# Patient Record
Sex: Female | Born: 1945 | Race: Black or African American | Hispanic: No | Smoking: Former smoker
Health system: Southern US, Community
[De-identification: ages and names within clinical notes are randomized; demographics above are authoritative.]

## PROBLEM LIST (undated history)

## (undated) ENCOUNTER — Emergency Department (HOSPITAL_COMMUNITY): Payer: Medicare PPO | Source: Home / Self Care

## (undated) DIAGNOSIS — L97909 Non-pressure chronic ulcer of unspecified part of unspecified lower leg with unspecified severity: Secondary | ICD-10-CM

## (undated) DIAGNOSIS — R010 Benign and innocent cardiac murmurs: Secondary | ICD-10-CM

## (undated) DIAGNOSIS — I8390 Asymptomatic varicose veins of unspecified lower extremity: Secondary | ICD-10-CM

## (undated) DIAGNOSIS — I10 Essential (primary) hypertension: Secondary | ICD-10-CM

## (undated) DIAGNOSIS — M1990 Unspecified osteoarthritis, unspecified site: Secondary | ICD-10-CM

## (undated) HISTORY — DX: Asymptomatic varicose veins of unspecified lower extremity: I83.90

## (undated) HISTORY — DX: Morbid (severe) obesity due to excess calories: E66.01

## (undated) HISTORY — DX: Unspecified osteoarthritis, unspecified site: M19.90

## (undated) HISTORY — DX: Benign and innocent cardiac murmurs: R01.0

## (undated) HISTORY — DX: Essential (primary) hypertension: I10

## (undated) HISTORY — DX: Non-pressure chronic ulcer of unspecified part of unspecified lower leg with unspecified severity: L97.909

---

## 1978-05-10 DIAGNOSIS — I10 Essential (primary) hypertension: Secondary | ICD-10-CM

## 1978-05-10 HISTORY — DX: Essential (primary) hypertension: I10

## 1992-05-10 HISTORY — PX: ABDOMINAL HYSTERECTOMY: SHX81

## 2000-02-11 DIAGNOSIS — G542 Cervical root disorders, not elsewhere classified: Secondary | ICD-10-CM | POA: Insufficient documentation

## 2001-02-14 DIAGNOSIS — F419 Anxiety disorder, unspecified: Secondary | ICD-10-CM | POA: Insufficient documentation

## 2004-11-12 ENCOUNTER — Encounter: Admission: RE | Admit: 2004-11-12 | Discharge: 2004-11-12 | Payer: Self-pay | Admitting: Obstetrics and Gynecology

## 2005-08-19 DIAGNOSIS — R252 Cramp and spasm: Secondary | ICD-10-CM | POA: Insufficient documentation

## 2006-06-01 DIAGNOSIS — G609 Hereditary and idiopathic neuropathy, unspecified: Secondary | ICD-10-CM | POA: Insufficient documentation

## 2008-06-25 DIAGNOSIS — M25562 Pain in left knee: Secondary | ICD-10-CM | POA: Insufficient documentation

## 2012-10-26 ENCOUNTER — Other Ambulatory Visit: Payer: Self-pay | Admitting: *Deleted

## 2012-10-26 DIAGNOSIS — I83893 Varicose veins of bilateral lower extremities with other complications: Secondary | ICD-10-CM

## 2012-12-27 ENCOUNTER — Other Ambulatory Visit: Payer: Self-pay | Admitting: Neurosurgery

## 2012-12-27 DIAGNOSIS — M542 Cervicalgia: Secondary | ICD-10-CM

## 2012-12-29 ENCOUNTER — Encounter: Payer: Self-pay | Admitting: Vascular Surgery

## 2012-12-31 ENCOUNTER — Other Ambulatory Visit: Payer: Self-pay

## 2013-01-01 ENCOUNTER — Encounter: Payer: Self-pay | Admitting: Vascular Surgery

## 2013-01-01 ENCOUNTER — Ambulatory Visit (INDEPENDENT_AMBULATORY_CARE_PROVIDER_SITE_OTHER): Payer: Medicare PPO | Admitting: Vascular Surgery

## 2013-01-01 ENCOUNTER — Encounter (INDEPENDENT_AMBULATORY_CARE_PROVIDER_SITE_OTHER): Payer: Medicare PPO | Admitting: *Deleted

## 2013-01-01 VITALS — BP 136/76 | HR 59 | Resp 18 | Ht 68.0 in | Wt 270.0 lb

## 2013-01-01 DIAGNOSIS — I83893 Varicose veins of bilateral lower extremities with other complications: Secondary | ICD-10-CM

## 2013-01-01 DIAGNOSIS — I8390 Asymptomatic varicose veins of unspecified lower extremity: Secondary | ICD-10-CM | POA: Insufficient documentation

## 2013-01-01 NOTE — Progress Notes (Signed)
Subjective:     Patient ID: Kim Wilkerson, female   DOB: 07-19-1945, 67 y.o.   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 These have been present for several years. She has swelling in both legs as the day progresses. She has no history of deep vein thrombosis. She also has no history of stasis ulcers. She tried to worsen long light elastic compression stockings referred by a physician in the past but they were quite tight and she had difficulty getting them on and off and discontinued them. Her symptoms continued to worsen with severe hypersensitivity itching burning and stinging discomfort which worsened as the day progresses.  Past Medical History  Diagnosis Date  . Hypertension   . Varicose veins   . Arthritis   . Ulcer     leg    History  Substance Use Topics  . Smoking status: Former Smoker    Types: Cigarettes    Quit date: 01/02/1983  . Smokeless tobacco: Never Used  . Alcohol Use: Yes    Family History  Problem Relation Age of Onset  . Hypertension Mother   . Hypertension Father     No Known Allergies  Current outpatient prescriptions:diltiazem (CARDIZEM CD) 240 MG 24 hr capsule, Take 240 mg by mouth daily., Disp: , Rfl: ;  furosemide (LASIX) 10 MG/ML solution, Take 10 mg by mouth daily., Disp: , Rfl: ;  HYDROcodone-acetaminophen (NORCO) 10-325 MG per tablet, Take 1 tablet by mouth every 4 (four) hours as needed for pain., Disp: , Rfl: ;  Vitamin D, Ergocalciferol, (DRISDOL) 50000 UNITS CAPS capsule, Take 50,000 Units by mouth., Disp: , Rfl:   BP 136/76  Pulse 59  Resp 18  Ht 5\' 8"  (1.727 m)  Wt 270 lb (122.471 kg)  BMI 41.06 kg/m2  Body mass index is 41.06 kg/(m^2).        Review of Systems complains of chest pain, palpitations, leg pain with walking, swelling in legs, weakness in legs, numbness in legs, She has easy fatigability and unable to ambulate further than one block.  Other systems negative and complete review of systems    Objective:   Physical Exam BP 136/76  Pulse 59  Resp 18  Ht 5\' 8"  (1.727 m)  Wt 270 lb (122.471 kg)  BMI 41.06 kg/m2  Gen.-alert and oriented x3 in no apparent distress HEENT normal for age-obese Lungs no rhonchi or wheezing Cardiovascular regular rhythm no murmurs carotid pulses 3+ palpable no bruits audible Abdomen soft nontender no palpable masses-obese Musculoskeletal free of  major deformities Skin clear -no rashes Neurologic normal Lower extremities 3+ femoral and dorsalis pedis pulses palpable bilaterally with 1+ edema bilaterally. Right leg has bulging varicosities down lateral calf area with other spider veins in clusters. No active ulceration noted. Left leg has some small bulging varicosities below the knee and great saphenous system.  Today I ordered a venous duplex exam of the right leg which are reviewed and interpreted. There is gross reflux which extends down to th distal thigh in the great saphenous vein with no DVT. This is a large caliber vein. I confirmed this by my independent SonoSite examination of the right great saphenous system.e      Assessment:     Severe venous insufficiency right leg with great saphenous reflux supplying bulging painful varicosities and causing chronic edema and pain right leg-affecting patient's daily living     Plan:     #1  long-leg elastic compression stockings 20-30 mm gradient #2 elevate legs as much as possible during the day #3 ibuprofen on a daily basis #4 return in 3 months. If no dramatic improvement in right leg patient needs #1 laser ablation right great saphenous vein and then return in 3 months to see if either sclerotherapy or stab phlebectomy would be indicated as a secondary procedure

## 2013-01-04 ENCOUNTER — Ambulatory Visit
Admission: RE | Admit: 2013-01-04 | Discharge: 2013-01-04 | Disposition: A | Discharge status: Home / Self Care | Payer: Medicare PPO | Source: Ambulatory Visit | Attending: Neurosurgery | Admitting: Neurosurgery

## 2013-01-04 DIAGNOSIS — M542 Cervicalgia: Secondary | ICD-10-CM

## 2013-03-15 ENCOUNTER — Other Ambulatory Visit: Payer: Self-pay

## 2013-04-02 ENCOUNTER — Encounter: Payer: Self-pay | Admitting: Vascular Surgery

## 2013-04-03 ENCOUNTER — Ambulatory Visit: Payer: Medicare PPO | Admitting: Vascular Surgery

## 2013-04-16 ENCOUNTER — Encounter: Payer: Self-pay | Admitting: Vascular Surgery

## 2013-04-17 ENCOUNTER — Encounter: Payer: Self-pay | Admitting: Vascular Surgery

## 2013-04-17 ENCOUNTER — Ambulatory Visit (INDEPENDENT_AMBULATORY_CARE_PROVIDER_SITE_OTHER): Payer: Medicare PPO | Admitting: Vascular Surgery

## 2013-04-17 VITALS — BP 162/80 | HR 71 | Resp 18 | Ht 68.0 in | Wt 274.0 lb

## 2013-04-17 DIAGNOSIS — I83893 Varicose veins of bilateral lower extremities with other complications: Secondary | ICD-10-CM

## 2013-04-17 NOTE — Progress Notes (Signed)
Subjective:     Patient ID: Kim Wilkerson, female   DOB: Aug 23, 1945, 67 y.o.   MRN: [id number]  HPI this 67 year old female returns for continued followup regarding her severe venous insufficiency in the right leg. She has bulging varicosities in the distal thigh and lateral calf area do to reflux in the right great saphenous pain. She has tried long light elastic compression stockings which are quite difficult because of the large size of her legs. She's also tried ibuprofen and elevation. She's had no improvement in her symptoms of aching burning and throbbing discomfort. She has no history of DVT.  Past Medical History  Diagnosis Date  . Hypertension   . Varicose veins   . Arthritis   . Ulcer     leg    History  Substance Use Topics  . Smoking status: Former Smoker    Types: Cigarettes    Quit date: 01/02/1983  . Smokeless tobacco: Never Used  . Alcohol Use: Yes    Family History  Problem Relation Age of Onset  . Hypertension Mother   . Hypertension Father     No Known Allergies  Current outpatient prescriptions:diltiazem (CARDIZEM CD) 240 MG 24 hr capsule, Take 240 mg by mouth daily., Disp: , Rfl: ;  furosemide (LASIX) 10 MG/ML solution, Take 10 mg by mouth daily., Disp: , Rfl: ;  HYDROcodone-acetaminophen (NORCO) 10-325 MG per tablet, Take 1 tablet by mouth every 4 (four) hours as needed for pain., Disp: , Rfl: ;  Vitamin D, Ergocalciferol, (DRISDOL) 50000 UNITS CAPS capsule, Take 50,000 Units by mouth., Disp: , Rfl:   BP 162/80  Pulse 71  Resp 18  Ht 5\' 8"  (1.727 m)  Wt 274 lb (124.286 kg)  BMI 41.67 kg/m2  Body mass index is 41.67 kg/(m^2).           Review of Systems denies chest pain but does have dyspnea on exertion. No PND orthopnea or hemoptysis    Objective:   Physical Exam BP 162/80  Pulse 71  Resp 18  Ht 5\' 8"  (1.727 m)  Wt 274 lb (124.286 kg)  BMI 41.67 kg/m2  Gen. obese female in no apparent distress alert and oriented x3 Right lower  extremity with bulging varicosities anterior thigh extending down the lateral calf with 1+ distal edema. No active ulceration.     Assessment:     Severe venous insufficiency right leg with bulging painful varicosities and chronic edema due to to gross reflux right great saphenous vein. These symptoms are persistent to conservative measures and affecting patient's daily living    Plan:     Patient needs #1 laser ablation right great saphenous vein followed by a 3 month waiting period to then return and evaluate for possible sclerotherapy of the secondary varicosities in the right leg

## 2013-04-24 ENCOUNTER — Other Ambulatory Visit: Payer: Self-pay | Admitting: *Deleted

## 2013-04-24 DIAGNOSIS — I83893 Varicose veins of bilateral lower extremities with other complications: Secondary | ICD-10-CM

## 2013-05-02 ENCOUNTER — Encounter: Payer: Self-pay | Admitting: Vascular Surgery

## 2013-05-07 ENCOUNTER — Encounter: Payer: Self-pay | Admitting: Vascular Surgery

## 2013-05-07 ENCOUNTER — Ambulatory Visit (INDEPENDENT_AMBULATORY_CARE_PROVIDER_SITE_OTHER): Payer: Medicare PPO | Admitting: Vascular Surgery

## 2013-05-07 VITALS — BP 171/114 | HR 57 | Resp 16 | Ht >= 80 in | Wt 270.0 lb

## 2013-05-07 DIAGNOSIS — I83893 Varicose veins of bilateral lower extremities with other complications: Secondary | ICD-10-CM

## 2013-05-07 NOTE — Progress Notes (Signed)
Subjective:     Patient ID: Kim Wilkerson, female   DOB: [birth year], 67 y.o.   MRN: [id number]  HPI this 67 year old female had laser ablation right great saphenous vein performed for gross reflux in the right great saphenous system with venous hypertension pain and swelling. A total of 1090 J of energy was utilized. She tolerated the procedure well. This was done under local tumescent anesthesia.   Review of Systems     Objective:   Physical Exam BP 171/114  Pulse 57  Resp 16  Ht 9\' 10"  (2.997 m)  Wt 270 lb (122.471 kg)  BMI 13.64 kg/m2       Assessment:     Well-tolerated laser ablation right great saphenous vein performed under local tumescent anesthesia    Plan:     Return in one week for venous duplex exam right leg confirm closure right great saphenous vein Will then return in 3 months to see if sclerotherapy will be indicated for secondary varicosities

## 2013-05-07 NOTE — Progress Notes (Signed)
   Laser Ablation Procedure      Date: 05/07/2013    Kim Wilkerson DOB:[birth year]  Consent signed: Yes  Surgeon:J.D. Hart [place]  Procedure: Laser Ablation: right Greater Saphenous Vein  BP 171/114  Pulse 57  Resp 16  Ht 9\' 10"  (2.997 m)  Wt 270 lb (122.471 kg)  BMI 13.64 kg/m2  Start time: 9:15   End time: 9:50  Tumescent Anesthesia: 210 cc 0.9% NaCl with 50 cc Lidocaine HCL with 1% Epi and 15 cc 8.4% NaHCO3  Local Anesthesia: 3 cc Lidocaine HCL and NaHCO3 (ratio 2:1)  Pulsed mode: 15 watts, delay, 1.0 duration Total energy: 1091, total pulses: 73, total time: 1:12      Patient tolerated procedure well: Yes  Notes:   Description of Procedure:  After marking the course of the saphenous vein and the secondary varicosities in the standing position, the patient was placed on the operating table in the supine position, and the right leg was prepped and draped in sterile fashion. Local anesthetic was administered, and under ultrasound guidance the saphenous vein was accessed with a micro needle and guide wire; then the micro puncture sheath was placed. A guide wire was inserted to the saphenofemoral junction, followed by a 5 french sheath.  The position of the sheath and then the laser fiber below the junction was confirmed using the ultrasound and visualization of the aiming beam.  Tumescent anesthesia was administered along the course of the saphenous vein using ultrasound guidance. Protective laser glasses were placed on the patient, and the laser was fired at 15 watts pulsed mode.  For a total of 1091 joules.  A steri strip was applied to the puncture site.    ABD pads and thigh high compression stockings were applied.  Ace wrap bandages were applied over the phlebectomy sites and at the top of the saphenofemoral junction.  Blood loss was less than 15 cc.  The patient ambulated out of the operating room having tolerated the procedure well.

## 2013-05-08 ENCOUNTER — Telehealth: Payer: Self-pay | Admitting: *Deleted

## 2013-05-08 NOTE — Telephone Encounter (Signed)
Patient doing well. Having some stocking issues. Following all instructions. Went over her fu appt times.

## 2013-05-11 ENCOUNTER — Encounter: Payer: Self-pay | Admitting: Vascular Surgery

## 2013-05-14 ENCOUNTER — Ambulatory Visit (INDEPENDENT_AMBULATORY_CARE_PROVIDER_SITE_OTHER): Payer: Commercial Managed Care - HMO | Admitting: Vascular Surgery

## 2013-05-14 ENCOUNTER — Ambulatory Visit (HOSPITAL_COMMUNITY)
Admission: RE | Admit: 2013-05-14 | Discharge: 2013-05-14 | Disposition: A | Discharge status: Home / Self Care | Payer: Medicare PPO | Source: Ambulatory Visit | Attending: Vascular Surgery | Admitting: Vascular Surgery

## 2013-05-14 ENCOUNTER — Encounter (HOSPITAL_COMMUNITY): Payer: Medicare PPO

## 2013-05-14 ENCOUNTER — Encounter: Payer: Self-pay | Admitting: Vascular Surgery

## 2013-05-14 ENCOUNTER — Encounter: Payer: Medicare PPO | Admitting: Vascular Surgery

## 2013-05-14 VITALS — BP 160/89 | HR 67 | Resp 18 | Ht 68.0 in | Wt 267.0 lb

## 2013-05-14 DIAGNOSIS — I83893 Varicose veins of bilateral lower extremities with other complications: Secondary | ICD-10-CM

## 2013-05-14 NOTE — Progress Notes (Signed)
Subjective:     Patient ID: Kim Wilkerson, female   DOB: 1946-03-24, 68 y.o.   MRN: [id number]  HPI this 68 year old female returned one week post laser ablation right great saphenous vein for painful varicosities and edema due to venous hypertension from reflux in the right great saphenous system. She tolerated the procedure well. She has had some discomfort in the proximal thigh where the saphenous vein was closed. She has had some improvement in the overall edema and tightness of the leg. She does have chronic edema also in the left leg but has been shown to have no reflux in the left great saphenous system on that side.  Past Medical History  Diagnosis Date  . Hypertension   . Varicose veins   . Arthritis   . Ulcer     leg    History  Substance Use Topics  . Smoking status: Former Smoker    Types: Cigarettes    Quit date: 01/02/1983  . Smokeless tobacco: Never Used  . Alcohol Use: Yes    Family History  Problem Relation Age of Onset  . Hypertension Mother   . Hypertension Father     No Known Allergies  Current outpatient prescriptions:diltiazem (CARDIZEM CD) 240 MG 24 hr capsule, Take 240 mg by mouth daily., Disp: , Rfl: ;  furosemide (LASIX) 10 MG/ML solution, Take 10 mg by mouth daily., Disp: , Rfl: ;  HYDROcodone-acetaminophen (NORCO) 10-325 MG per tablet, Take 1 tablet by mouth every 4 (four) hours as needed for pain., Disp: , Rfl: ;  Vitamin D, Ergocalciferol, (DRISDOL) 50000 UNITS CAPS capsule, Take 50,000 Units by mouth., Disp: , Rfl:   BP 160/89  Pulse 67  Resp 18  Ht 5\' 8"  (1.727 m)  Wt 267 lb (121.11 kg)  BMI 40.61 kg/m2  Body mass index is 40.61 kg/(m^2).           Review of Systems denies chest pain, dyspnea on exertion, PND, orthopnea, hemoptysis, claudication to     Objective:   Physical Exam BP 160/89  Pulse 67  Resp 18  Ht 5\' 8"  (1.727 m)  Wt 267 lb (121.11 kg)  BMI 40.61 kg/m2  Gen. obese female in no apparent stress alert and oriented  x3 Lungs no rhonchi or wheezing Right lower extremity with 3+ dorsalis pedis pulse palpable. 1+ edema distally. Mild tenderness along course of proximal right saphenous vein. Prominent reticular veins in the distal mid thigh area and in the medial calf.  Today I ordered a venous duplex exam of the right leg which are viewed and interpreted. There is no DVT. The great saphenous vein on the right is closed to a point near the saphenofemoral junction.     Assessment:     Successful laser ablation right great saphenous vein for venous hypertension with swelling and painful varicosities    Plan:     Return in 3 months to see if sclerotherapy of residual varicosities will be indicated Best treatment for left lower extremity edema would-be short leg elastic compression stocking on a chronic basis since she does have no reflux in the left great saphenous system but does have some deep venous reflux

## 2013-08-06 ENCOUNTER — Encounter: Payer: Self-pay | Admitting: Vascular Surgery

## 2013-08-07 ENCOUNTER — Ambulatory Visit: Payer: Medicare PPO | Admitting: Vascular Surgery

## 2013-08-14 ENCOUNTER — Ambulatory Visit: Payer: Medicare PPO | Admitting: Vascular Surgery

## 2014-10-14 DIAGNOSIS — M170 Bilateral primary osteoarthritis of knee: Secondary | ICD-10-CM | POA: Insufficient documentation

## 2014-10-17 ENCOUNTER — Telehealth: Payer: Self-pay

## 2014-10-17 DIAGNOSIS — I8390 Asymptomatic varicose veins of unspecified lower extremity: Secondary | ICD-10-CM

## 2014-10-17 DIAGNOSIS — M7989 Other specified soft tissue disorders: Secondary | ICD-10-CM

## 2014-10-17 DIAGNOSIS — M79605 Pain in left leg: Secondary | ICD-10-CM

## 2014-10-17 NOTE — Telephone Encounter (Signed)
Discussed symptoms with L. Sherene Sires, RN for Dr. Hart [place].  Recommended to schedule for left LE venous reflux since the left leg is symptomatic.  Will contact pt. for appt.

## 2014-10-17 NOTE — Telephone Encounter (Signed)
Phone call from pt.  Reported she has worsening swelling of left lower extremity; stated both legs are swollen from foot to above knee but the left leg > right leg.  Stated she wears her compression stockings for as long as she can tolerate, until the stocking cuts in at the ankle area; then has to take off.  Stated her left leg is so swollen, it is too painful to walk.  Reported that she saw an Orthopedic doctor recently, and was advised to see the vein doctor. Denied any open sores of lower extremities.  Encouraged to elevate her legs above level of heart, at least 3-4 times/day, to try to manage the swelling and discomfort until evaluated.  Verb. Understanding.

## 2014-10-17 NOTE — Telephone Encounter (Signed)
Sched appt 11/05/14 with pt on the phone, lab at 11:30, JDL at 12:45

## 2014-11-01 ENCOUNTER — Encounter: Payer: Self-pay | Admitting: Vascular Surgery

## 2014-11-04 ENCOUNTER — Other Ambulatory Visit: Payer: Self-pay

## 2014-11-05 ENCOUNTER — Ambulatory Visit (HOSPITAL_COMMUNITY)
Admission: RE | Admit: 2014-11-05 | Discharge: 2014-11-05 | Disposition: A | Discharge status: Home / Self Care | Payer: Medicare PPO | Source: Ambulatory Visit | Attending: Vascular Surgery | Admitting: Vascular Surgery

## 2014-11-05 ENCOUNTER — Ambulatory Visit (INDEPENDENT_AMBULATORY_CARE_PROVIDER_SITE_OTHER): Payer: Medicare PPO | Admitting: Vascular Surgery

## 2014-11-05 ENCOUNTER — Encounter: Payer: Self-pay | Admitting: Vascular Surgery

## 2014-11-05 VITALS — BP 147/80 | HR 64 | Resp 14 | Ht 68.0 in | Wt 263.0 lb

## 2014-11-05 DIAGNOSIS — M79605 Pain in left leg: Secondary | ICD-10-CM

## 2014-11-05 DIAGNOSIS — I8390 Asymptomatic varicose veins of unspecified lower extremity: Secondary | ICD-10-CM

## 2014-11-05 DIAGNOSIS — I83892 Varicose veins of left lower extremities with other complications: Secondary | ICD-10-CM

## 2014-11-05 DIAGNOSIS — M7989 Other specified soft tissue disorders: Secondary | ICD-10-CM

## 2014-11-05 DIAGNOSIS — I868 Varicose veins of other specified sites: Secondary | ICD-10-CM

## 2014-11-05 NOTE — Progress Notes (Signed)
Filed Vitals:   11/05/14 1204 11/05/14 1207  BP: 156/80 147/80  Pulse: 62 64  Resp: 14   Height: 5\' 8"  (1.727 m)   Weight: 263 lb (119.296 kg)    Body mass index is 40 kg/(m^2).

## 2014-11-05 NOTE — Progress Notes (Signed)
Subjective:     Patient ID: Kim Wilkerson, female   DOB: 10-Sep-1945, 69 y.o.   MRN: [id number]  HPI This 69 year old female is seen today for pain and swelling in the left lower extremity. She previously underwent laser ablation of the right lower extremity great saphenous vein in December 2014 and has had improvement in the pain and swelling in that leg. She recently has had some very unstable situations with her left knee and has significant arthritis -bone on bone. She has chronic swelling in the left leg with some painful varicosities. The swelling has worsened. She elevates her legs at night and does try elastic compression stockings but she states that those are so tight they cut off her circulation. She has no history of DVT in the left leg  , stasis ulcers, or bleeding.  Past Medical History  Diagnosis Date  . Hypertension   . Varicose veins   . Arthritis   . Ulcer     leg    History  Substance Use Topics  . Smoking status: Former Smoker    Types: Cigarettes    Quit date: 01/02/1983  . Smokeless tobacco: Never Used  . Alcohol Use: 0.0 oz/week    0 Standard drinks or equivalent per week    Family History  Problem Relation Age of Onset  . Hypertension Mother   . Hypertension Father     No Known Allergies   Current outpatient prescriptions:  .  aspirin 81 MG tablet, Take 81 mg by mouth daily., Disp: , Rfl:  .  diltiazem (CARDIZEM CD) 240 MG 24 hr capsule, Take 240 mg by mouth daily., Disp: , Rfl:  .  furosemide (LASIX) 10 MG/ML solution, Take 10 mg by mouth daily., Disp: , Rfl:  .  ibuprofen (ADVIL,MOTRIN) 800 MG tablet, Take 800 mg by mouth every 8 (eight) hours as needed., Disp: , Rfl:  .  Vitamin D, Ergocalciferol, (DRISDOL) 50000 UNITS CAPS capsule, Take 50,000 Units by mouth., Disp: , Rfl:  .  HYDROcodone-acetaminophen (NORCO) 10-325 MG per tablet, Take 1 tablet by mouth every 4 (four) hours as needed for pain., Disp: , Rfl:   Filed Vitals:   11/05/14 1204 11/05/14  1207  BP: 156/80 147/80  Pulse: 62 64  Resp: 14   Height: 5\' 8"  (1.727 m)   Weight: 263 lb (119.296 kg)     Body mass index is 40 kg/(m^2).           Review of Systems  Denies chest pain but does have dyspnea on exertion. No PND or orthopnea. Has severe arthritis left knee.     Objective:   Physical Exam BP 147/80 mmHg  Pulse 64  Resp 14  Ht 5\' 8"  (1.727 m)  Wt 263 lb (119.296 kg)  BMI 40.00 kg/m2  Gen.-alert and oriented x3 in no apparent distress -obese HEENT normal for age Lungs no rhonchi or wheezing Cardiovascular regular rhythm no murmurs carotid pulses 3+ palpable no bruits audible Abdomen soft nontender no palpable masses -Obese Musculoskeletal free of  major deformities Skin clear -no rashes Neurologic normal Lower extremities 3+ femoral and dorsalis pedis pulses palpable bilaterally with  1+ edema on the right 2+ edema on the left. Bulging varicosities left medial and lateral calf area. No hyperpigmentation or ulceration noted.   Today I ordered a venous duplex exam the left leg which I reviewed and interpreted. There is no DVT. There is some deep vein reflux in the common femoral vein. The great saphenous vein  does have reflux near the junction and is of large size for a short distance and then tapers down into the mid distal thigh area.       Assessment:      swelling and pain left leg due to deep vein reflux       Plan:      #1 elevate foot of bed 3 inches #2 short-leg elastic compression stockings -we'll try to refit patient for stockings if she would like  #3 do not feel that ablation of her left great saphenous vein will significantly help her edema at this time

## 2016-11-07 HISTORY — PX: ESOPHAGEAL DILATION: SHX303

## 2017-05-04 DIAGNOSIS — M47812 Spondylosis without myelopathy or radiculopathy, cervical region: Secondary | ICD-10-CM | POA: Insufficient documentation

## 2017-05-04 DIAGNOSIS — I10 Essential (primary) hypertension: Secondary | ICD-10-CM | POA: Insufficient documentation

## 2017-05-04 NOTE — Progress Notes (Signed)
Subjective: ZO:XWRUEAVWU care, HTN HPI: Kim Wilkerson is a 71 y.o. female presenting to clinic today for:  1. Hypertension Patient reports she has been out of her blood pressure medication for almost a month now.  Her medications include Cardizem CD 240 milligrams daily and Cozaar 50 mg daily, Side effects: None  ROS: Denies headache, dizziness, visual changes, nausea, vomiting, chest pain, LE swelling, abdominal pain or shortness of breath.  2. 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  She currently sees an orthopedist at [facility], Dr. Olegario Shearer for her knee.  She is undergoing Visco supplementation for the left knee.  She has been using ibuprofen 800 mg as needed for breakthrough pain.  She notes that Relafen does not help much with pain.  No falls.  No numbness or tingling.  3.  Preventative care Patient reports that she had her mammogram done in [place] earlier this year.  This was benign.  She had a hysterectomy and has not had a Pap smear in some time because this was a total hysterectomy.  She has not had any bone density screening done in a while.  She is never had a colonoscopy.  She is unsure of last tetanus and pneumonia shot.  Past Medical History:  Diagnosis Date  . Arthritis   . Hypertension   . Ulcer    leg  . Varicose veins    Past Surgical History:  Procedure Laterality Date  . ABDOMINAL HYSTERECTOMY     Social History   Socioeconomic History  . Marital status: Widowed    Spouse name: Not on file  . Number of children: Not on file  . Years of education: Not on file  . Highest education level: Not on file  Social Needs  . Financial resource strain: Not on file  . Food insecurity - worry: Not on file  . Food insecurity - inability: Not on file  . Transportation needs - medical: Not on file  . Transportation needs - non-medical: Not on file  Occupational History  . Not on file  Tobacco  Use  . Smoking status: Former Smoker    Types: Cigarettes    Last attempt to quit: 01/02/1983    Years since quitting: 34.3  . Smokeless tobacco: Never Used  Substance and Sexual Activity  . Alcohol use: Yes    Alcohol/week: 0.0 oz  . Drug use: No  . Sexual activity: Not on file  Other Topics Concern  . Not on file  Social History Narrative  . Not on file   Current Meds  Medication Sig  . losartan (COZAAR) 50 MG tablet Take 1 tablet (50 mg total) by mouth daily.  Marland Kitchen omeprazole (PRILOSEC) 20 MG capsule Take 20 mg by mouth daily.  . [DISCONTINUED] losartan (COZAAR) 50 MG tablet Take 50 mg by mouth daily.  . [DISCONTINUED] nabumetone (RELAFEN) 750 MG tablet Take 750 mg by mouth 2 (two) times daily as needed.   Family History  Problem Relation Age of Onset  . Hypertension Mother   . Hypertension Father    No Known Allergies   Health Maintenance: Colonoscopy  ROS: Per HPI  Objective: Office vital signs reviewed. BP 139/83   Pulse 73   Temp 98.8 F (37.1 C) (Oral)   Ht 5\' 8"  (1.727 m)   Wt 281 lb (127.5 kg)   BMI 42.73 kg/m   Physical Examination:  General: Awake, alert, morbidly obese, No acute  distress HEENT: Normal    Neck: No masses palpated. No lymphadenopathy    Ears: Tympanic membranes intact, normal light reflex, no erythema, no bulging    Eyes: PERRLA, extraocular movement in tact, sclera white    Nose: nasal turbinates moist, no nasal discharge    Throat: moist mucus membranes, no erythema, no tonsillar exudate.  Airway is patent Cardio: regular rate and rhythm, S1S2 heard, soft systolic murmur appreciated at the right sternal border Pulm: clear to auscultation bilaterally, no wheezes, rhonchi or rales; normal work of breathing on room air Ext: warm, no edema Neuro: follows all commands, no focal deficits  Depression screen PHQ 2/9 05/06/2017  Decreased Interest 0  Down, Depressed, Hopeless 0  PHQ - 2 Score 0    Assessment/ Plan: 71 y.o. female    Benign essential HTN Refill Cardizem and Cozaar.  Check CMP today.  Newly recognized heart murmur Soft systolic heart murmur appreciated at the right sternal border.  I am somewhat suspicious of sleep apnea in this patient which may be a contributing factor.  Will refer to cardiology for further evaluation.  Sleep difficulties Concern for underlying sleep apnea.  I have placed an order for a split-night study to evaluate.  Encounter to establish care with new doctor Patient to have outside records sent to our office.  Osteoarthritis of cervical spine, unspecified spinal osteoarthritis complication status Motrin refilled.  I did recommend the patient use this sparingly and depend on Tylenol arthritis for arthralgias, given her history of hypertension and acid reflux.  Encounter for hepatitis C screening test for low risk patient - Hepatitis C antibody; Future  Screening for HIV without presence of risk factors - HIV antibody (with reflex); Future  Postmenopausal - DG [facility] DEXA; Future  Screening for colon cancer - Ambulatory referral to Gastroenterology   Raliegh Ip, DO [facility] [phone]

## 2017-05-06 ENCOUNTER — Ambulatory Visit: Payer: Medicare PPO | Admitting: Family Medicine

## 2017-05-06 ENCOUNTER — Encounter: Payer: Self-pay | Admitting: Family Medicine

## 2017-05-06 VITALS — BP 139/83 | HR 73 | Temp 98.8°F | Ht 68.0 in | Wt 281.0 lb

## 2017-05-06 DIAGNOSIS — G479 Sleep disorder, unspecified: Secondary | ICD-10-CM

## 2017-05-06 DIAGNOSIS — M47812 Spondylosis without myelopathy or radiculopathy, cervical region: Secondary | ICD-10-CM

## 2017-05-06 DIAGNOSIS — G4733 Obstructive sleep apnea (adult) (pediatric): Secondary | ICD-10-CM | POA: Insufficient documentation

## 2017-05-06 DIAGNOSIS — Z1159 Encounter for screening for other viral diseases: Secondary | ICD-10-CM | POA: Diagnosis not present

## 2017-05-06 DIAGNOSIS — Z1211 Encounter for screening for malignant neoplasm of colon: Secondary | ICD-10-CM | POA: Diagnosis not present

## 2017-05-06 DIAGNOSIS — R0683 Snoring: Secondary | ICD-10-CM | POA: Diagnosis not present

## 2017-05-06 DIAGNOSIS — Z780 Asymptomatic menopausal state: Secondary | ICD-10-CM | POA: Diagnosis not present

## 2017-05-06 DIAGNOSIS — Z7689 Persons encountering health services in other specified circumstances: Secondary | ICD-10-CM

## 2017-05-06 DIAGNOSIS — I10 Essential (primary) hypertension: Secondary | ICD-10-CM

## 2017-05-06 DIAGNOSIS — R010 Benign and innocent cardiac murmurs: Secondary | ICD-10-CM | POA: Insufficient documentation

## 2017-05-06 DIAGNOSIS — Z114 Encounter for screening for human immunodeficiency virus [HIV]: Secondary | ICD-10-CM | POA: Diagnosis not present

## 2017-05-06 DIAGNOSIS — R011 Cardiac murmur, unspecified: Secondary | ICD-10-CM

## 2017-05-06 MED ORDER — DILTIAZEM HCL ER COATED BEADS 240 MG PO CP24: 240.0000 mg | ORAL_CAPSULE | Freq: Every day | ORAL | 0 refills | Status: DC

## 2017-05-06 MED ORDER — LOSARTAN POTASSIUM 50 MG PO TABS: 50.0000 mg | ORAL_TABLET | Freq: Every day | ORAL | 0 refills | Status: DC

## 2017-05-06 MED ORDER — IBUPROFEN 800 MG PO TABS: 800.0000 mg | ORAL_TABLET | Freq: Three times a day (TID) | ORAL | 0 refills | Status: DC | PRN

## 2017-05-06 NOTE — Assessment & Plan Note (Signed)
Soft systolic heart murmur appreciated at the right sternal border.  I am somewhat suspicious of sleep apnea in this patient which may be a contributing factor.  Will refer to cardiology for further evaluation.

## 2017-05-06 NOTE — Assessment & Plan Note (Addendum)
Refill Cardizem and Cozaar.  Check CMP today.

## 2017-05-06 NOTE — Patient Instructions (Addendum)
I value your feedback and appreciate you entrusting [place] with your care.  If you get a survey, I would appreciate your taking the time to let us know what your experience was like.  Schedule an appt with Dr Allena Katz for your colonoscopy Bring your mammogram results to me I have ordered a sleep study  See me in 3 months for blood pressure Come in at your convenience for fasting labs

## 2017-05-06 NOTE — Assessment & Plan Note (Signed)
Concern for underlying sleep apnea.  I have placed an order for a split-night study to evaluate.

## 2017-05-10 DIAGNOSIS — R010 Benign and innocent cardiac murmurs: Secondary | ICD-10-CM

## 2017-05-10 HISTORY — DX: Benign and innocent cardiac murmurs: R01.0

## 2017-05-11 ENCOUNTER — Encounter: Payer: Self-pay | Admitting: Gastroenterology

## 2017-05-13 ENCOUNTER — Other Ambulatory Visit: Payer: Self-pay

## 2017-05-13 DIAGNOSIS — G4730 Sleep apnea, unspecified: Secondary | ICD-10-CM

## 2017-05-17 ENCOUNTER — Other Ambulatory Visit: Payer: Self-pay | Admitting: Family Medicine

## 2017-05-18 ENCOUNTER — Other Ambulatory Visit: Payer: Self-pay | Admitting: Family Medicine

## 2017-05-18 DIAGNOSIS — E559 Vitamin D deficiency, unspecified: Secondary | ICD-10-CM

## 2017-05-18 NOTE — Telephone Encounter (Signed)
No Vit D in EPIC 

## 2017-05-18 NOTE — Telephone Encounter (Signed)
Patient aware and verbalizes understanding - will come in tomorrow for lab.

## 2017-05-18 NOTE — Telephone Encounter (Signed)
Please have patient come in for fasting labs.  She is on a potassium sparing blood pressure medication, so she should not technically need potassium.  Prescription vitamin D is not usually prescribed longer than 12 weeks.  I can check her level to see if she needs this again.  I will add it to her labs.  Once these are done, we can determine if she needs these medications.

## 2017-05-20 ENCOUNTER — Other Ambulatory Visit: Payer: Medicare PPO

## 2017-05-20 DIAGNOSIS — I10 Essential (primary) hypertension: Secondary | ICD-10-CM

## 2017-05-20 DIAGNOSIS — E559 Vitamin D deficiency, unspecified: Secondary | ICD-10-CM

## 2017-05-20 DIAGNOSIS — Z114 Encounter for screening for human immunodeficiency virus [HIV]: Secondary | ICD-10-CM

## 2017-05-20 DIAGNOSIS — Z1159 Encounter for screening for other viral diseases: Secondary | ICD-10-CM

## 2017-05-21 LAB — LIPID PANEL
CHOL/HDL RATIO: 3.2 ratio (ref 0.0–4.4)
Cholesterol, Total: 159 mg/dL (ref 100–199)
HDL: 49 mg/dL (ref >39)
LDL Calculated: 96 mg/dL (ref 0–99)
Triglycerides: 72 mg/dL (ref 0–149)
VLDL Cholesterol Cal: 14 mg/dL (ref 5–40)

## 2017-05-21 LAB — CMP14+EGFR
ALT: 8 IU/L (ref 0–32)
AST: 13 IU/L (ref 0–40)
Albumin/Globulin Ratio: 1.4 (ref 1.2–2.2)
Albumin: 3.8 g/dL (ref 3.6–4.8)
Alkaline Phosphatase: 136 IU/L — ABNORMAL HIGH (ref 39–117)
BUN/Creatinine Ratio: 16 (ref 12–28)
BUN: 11 mg/dL (ref 8–27)
Bilirubin Total: 0.7 mg/dL (ref 0.0–1.2)
CHLORIDE: 106 mmol/L (ref 96–106)
CO2: 24 mmol/L (ref 20–29)
Calcium: 8.8 mg/dL (ref 8.7–10.3)
Creatinine, Ser: 0.7 mg/dL (ref 0.57–1.00)
GFR calc non Af Amer: 89 mL/min/{1.73_m2} (ref >59)
GFR, EST AFRICAN AMERICAN: 102 mL/min/{1.73_m2} (ref >59)
Globulin, Total: 2.7 g/dL (ref 1.5–4.5)
Glucose: 95 mg/dL (ref 65–99)
Potassium: 4.4 mmol/L (ref 3.5–5.2)
Sodium: 143 mmol/L (ref 134–144)
TOTAL PROTEIN: 6.5 g/dL (ref 6.0–8.5)

## 2017-05-21 LAB — HEPATITIS C ANTIBODY: Hep C Virus Ab: <0.1 s/co ratio (ref 0.0–0.9)

## 2017-05-21 LAB — VITAMIN D 25 HYDROXY (VIT D DEFICIENCY, FRACTURES): Vit D, 25-Hydroxy: 20.4 ng/mL — ABNORMAL LOW (ref 30.0–100.0)

## 2017-05-21 LAB — HIV ANTIBODY (ROUTINE TESTING W REFLEX): HIV SCREEN 4TH GENERATION: NONREACTIVE

## 2017-05-24 ENCOUNTER — Other Ambulatory Visit: Payer: Self-pay | Admitting: Family Medicine

## 2017-05-24 MED ORDER — ATORVASTATIN CALCIUM 40 MG PO TABS: 40.0000 mg | ORAL_TABLET | Freq: Every day | ORAL | 0 refills | Status: DC

## 2017-05-30 ENCOUNTER — Encounter: Payer: Self-pay | Admitting: Cardiology

## 2017-05-30 NOTE — Progress Notes (Signed)
Cardiology Office Note  Date: 05/31/2017   ID: Kim Wilkerson, DOB Aug 07, 1945, MRN [id number]  PCP: Raliegh Ip, DO  Consulting Cardiologist: Nona Dell, MD   Chief Complaint  Patient presents with  . Heart Murmur    History of Present Illness: Kim Wilkerson is a 72 y.o. female referred for cardiology consultation by Dr. Nadine Counts for evaluation of heart murmur. She states that she has been told in the past that she had a heart murmur, but does not recall any specific diagnosis of valvular heart disease or a cardiac structural defect. She has not had a recent echocardiogram. At baseline she reports no exertional chest pain or palpitations, no syncope, NYHA class II dyspnea with typical activities. No recent change in stamina.  I personally reviewed her ECG today which shows sinus rhythm with nonspecific T-wave changes.  I reviewed her current medications which are outlined below. Blood pressure is adequately controlled today.  Past Medical History:  Diagnosis Date  . Arthritis   . Hypertension 1980  . Leg ulcer (HCC)   . Varicose veins     Past Surgical History:  Procedure Laterality Date  . ABDOMINAL HYSTERECTOMY  1994  . ESOPHAGEAL DILATION  11/2016   Dr Allena Katz in [place]    Current Outpatient Medications  Medication Sig Dispense Refill  . aspirin 81 MG tablet Take 81 mg by mouth daily.    Marland Kitchen atorvastatin (LIPITOR) 40 MG tablet Take 1 tablet (40 mg total) by mouth daily. 90 tablet 0  . diltiazem (CARDIZEM CD) 240 MG 24 hr capsule Take 1 capsule (240 mg total) by mouth daily. 90 capsule 0  . furosemide (LASIX) 40 MG tablet Take 40 mg by mouth daily.    Marland Kitchen losartan (COZAAR) 50 MG tablet Take 1 tablet (50 mg total) by mouth daily. 90 tablet 0  . omeprazole (PRILOSEC) 20 MG capsule Take 20 mg by mouth daily.     No current facility-administered medications for this visit.    Allergies:  Patient has no known allergies.   Social History: The patient  reports  that she quit smoking about 34 years ago. Her smoking use included cigarettes. she has never used smokeless tobacco. She reports that she drinks alcohol. She reports that she does not use drugs.   Family History: The patient's family history includes Alcohol abuse in her father; Cirrhosis in her father; Heart attack (age of onset: 17) in her sister; Hyperlipidemia in her mother; Hypertension in her brother, father, and mother; Stroke in her mother; Thyroid disease in her mother.   ROS:  Please see the history of present illness. Otherwise, complete review of systems is positive for arthralgias.  All other systems are reviewed and negative.   Physical Exam: VS:  BP 122/78   Pulse 70   Ht 5\' 8"  (1.727 m)   Wt 273 lb 6.4 oz (124 kg)   SpO2 96%   BMI 41.57 kg/m , BMI Body mass index is 41.57 kg/m.  Wt Readings from Last 3 Encounters:  05/31/17 273 lb 6.4 oz (124 kg)  05/06/17 281 lb (127.5 kg)  11/05/14 263 lb (119.3 kg)    General: Obese woman, appears comfortable at rest. HEENT: Conjunctiva and lids normal, oropharynx clear. Neck: Supple, no elevated JVP or carotid bruits, no thyromegaly. Lungs: Clear to auscultation, nonlabored breathing at rest. Cardiac: Regular rate and rhythm, no S3, 2/6 basal systolic murmur, no pericardial rub. Abdomen: Soft, nontender, bowel sounds present. Extremities: Venous stasis and varicosities, distal pulses  2+. Skin: Warm and dry. Musculoskeletal: No kyphosis. Neuropsychiatric: Alert and oriented x3, affect grossly appropriate.  ECG: There is no old tracing for review today.  Recent Labwork: 05/20/2017: ALT 8; AST 13; BUN 11; Creatinine, Ser 0.70; Potassium 4.4; Sodium 143     Component Value Date/Time   CHOL 159 05/20/2017 1121   TRIG 72 05/20/2017 1121   HDL 49 05/20/2017 1121   CHOLHDL 3.2 05/20/2017 1121   LDLCALC 96 05/20/2017 1121   Assessment and Plan:  1. Systolic heart murmur, potentially long-standing and functional, patient reports  being told of this in the past. No history of cardiac structural defects or valvular heart disease, but she has not had a recent echocardiogram and this will be scheduled for clarification.  2. Essential hypertension, on Cozaar and Cardizem CD. Blood pressure is well controlled today.  3. Hyperlipidemia, on Lipitor. Recent LDL 96.  4. History of varicose veins. Not overly symptomatic at this time. She is on low-dose Lasix.  Current medicines were reviewed with the patient today.   Orders Placed This Encounter  Procedures  . EKG 12-Lead  . ECHOCARDIOGRAM COMPLETE    Disposition: Call with test results.  Signed, Jonelle Sidle, MD, [facility] 05/31/2017 10:53 AM    [facility] at [facility] [address] [place], [place], [place] [address] Phone: [phone]; Fax: [phone]

## 2017-05-31 ENCOUNTER — Encounter: Payer: Self-pay | Admitting: Cardiology

## 2017-05-31 ENCOUNTER — Telehealth: Payer: Self-pay | Admitting: Cardiology

## 2017-05-31 ENCOUNTER — Ambulatory Visit: Payer: Medicare PPO | Admitting: Cardiology

## 2017-05-31 VITALS — BP 122/78 | HR 70 | Ht 68.0 in | Wt 273.4 lb

## 2017-05-31 DIAGNOSIS — R011 Cardiac murmur, unspecified: Secondary | ICD-10-CM | POA: Diagnosis not present

## 2017-05-31 DIAGNOSIS — I8393 Asymptomatic varicose veins of bilateral lower extremities: Secondary | ICD-10-CM

## 2017-05-31 DIAGNOSIS — E782 Mixed hyperlipidemia: Secondary | ICD-10-CM | POA: Diagnosis not present

## 2017-05-31 DIAGNOSIS — I10 Essential (primary) hypertension: Secondary | ICD-10-CM | POA: Diagnosis not present

## 2017-05-31 NOTE — Patient Instructions (Signed)
Medication Instructions:   Your physician recommends that you continue on your current medications as directed. Please refer to the Current Medication list given to you today.  Labwork:  NONE  Testing/Procedures: Your physician has requested that you have an echocardiogram. Echocardiography is a painless test that uses sound waves to create images of your heart. It provides your doctor with information about the size and shape of your heart and how well your heart's chambers and valves are working. This procedure takes approximately one hour. There are no restrictions for this procedure.  Follow-Up:  Your physician recommends that you schedule a follow-up appointment in: pending results.  Any Other Special Instructions Will Be Listed Below (If Applicable).  If you need a refill on your cardiac medications before your next appointment, please call your pharmacy.

## 2017-05-31 NOTE — Telephone Encounter (Signed)
Pre-cert Verification for the following procedure   °Echo scheduled for 06-09-17  °

## 2017-06-01 ENCOUNTER — Ambulatory Visit (INDEPENDENT_AMBULATORY_CARE_PROVIDER_SITE_OTHER): Payer: Self-pay

## 2017-06-01 DIAGNOSIS — Z1211 Encounter for screening for malignant neoplasm of colon: Secondary | ICD-10-CM

## 2017-06-01 MED ORDER — PEG 3350-KCL-NA BICARB-NACL 420 G PO SOLR: 4000.0000 mL | ORAL | 0 refills | Status: DC

## 2017-06-01 NOTE — Patient Instructions (Signed)
Kim Wilkerson   [birth year] MRN: [id number]    Procedure Date: 07/01/17 Time to register: 12:00 Place to register: [facility] Procedure Time: 1:00 Scheduled provider: Dr.Fields  PREPARATION FOR COLONOSCOPY WITH TRI-LYTE SPLIT PREP  Please notify us immediately if you are diabetic, take iron supplements, or if you are on Coumadin or any other blood thinners.     You will need to purchase 1 fleet enema and 1 box of Bisacodyl 47m tablets.    1 DAY BEFORE PROCEDURE:  DATE: 06/30/17   DAY: Thursday Continue clear liquids the entire day - NO SOLID FOOD.     At 2:00 pm:  Take 2 Bisacodyl tablets.   At 4:00pm:  Start drinking your solution. Make sure you mix well per instructions on the bottle. Try to drink 1 (one) 8 ounce glass every 10-15 minutes until you have consumed HALF the jug. You should complete by 6:00pm.You must keep the left over solution refrigerated until completed next day.  Continue clear liquids. You must drink plenty of clear liquids to prevent dehyration and kidney failure. Nothing to eat or drink after midnight.  EXCEPTION: If you take medications for your heart, blood pressure or breathing, you may take these medications with a small amount of clear liquid.    DAY OF PROCEDURE:   DATE: 07/01/17   DAY: Friday    Five hours before your procedure time @ 8:00am:  Finish remaining amout of bowel prep, drinking 1 (one) 8 ounce glass every 10-15 minutes until complete. You have two hours to consume remaining prep.   Three hours before your procedure time @10 :00am:  Nothing by mouth.   At least one hour before going to the hospital:  Give yourself one Fleet enema. You may take your morning medications with sip of water unless we have instructed otherwise.      Please see below for Dietary Information.  CLEAR LIQUIDS INCLUDE:  Water Jello (NOT red in color)   Ice Popsicles (NOT red in color)   Tea (sugar ok, no milk/cream) Powdered fruit flavored  drinks  Coffee (sugar ok, no milk/cream) Gatorade/ Lemonade/ Kool-Aid  (NOT red in color)   Juice: apple, white grape, white cranberry Soft drinks  Clear bullion, consomme, broth (fat free beef/chicken/vegetable)  Carbonated beverages (any kind)  Strained chicken noodle soup Hard Candy   Remember: Clear liquids are liquids that will allow you to see your fingers on the other side of a clear glass. Be sure liquids are NOT red in color, and not cloudy, but CLEAR.  DO NOT EAT OR DRINK ANY OF THE FOLLOWING:  Dairy products of any kind   Cranberry juice Tomato juice / V8 juice   Grapefruit juice Orange juice     Red grape juice  Do not eat any solid foods, including such foods as: cereal, oatmeal, yogurt, fruits, vegetables, creamed soups, eggs, bread, crackers, pureed foods in a blender, etc.   HELPFUL HINTS FOR DRINKING PREP SOLUTION:   Make sure prep is extremely cold. Mix and refrigerate the the morning of the prep. You may also put in the freezer.   You may try mixing some Crystal Light or Country Time Lemonade if you prefer. Mix in small amounts; add more if necessary.  Try drinking through a straw  Rinse mouth with water or a mouthwash between glasses, to remove after-taste.  Try sipping on a cold beverage /ice/ popsicles between glasses of prep.  Place a piece of sugar-free hard candy in mouth  between glasses.  If you become nauseated, try consuming smaller amounts, or stretch out the time between glasses. Stop for 30-60 minutes, then slowly start back drinking.        OTHER INSTRUCTIONS  You will need a responsible adult at least 70 years of age to accompany you and drive you home. This person must remain in the waiting room during your procedure. The hospital will cancel your procedure if you do not have a responsible adult with you.   1. Wear loose fitting clothing that is easily removed. 2. Leave jewelry and other valuables at home.  3. Remove all body piercing  jewelry and leave at home. 4. Total time from sign-in until discharge is approximately 2-3 hours. 5. You should go home directly after your procedure and rest. You can resume normal activities the day after your procedure. 6. The day of your procedure you should not:  Drive  Make legal decisions  Operate machinery  Drink alcohol  Return to work   You may call the office (Dept: [phone]) before 5:00pm, or page the doctor on call [phone]) after 5:00pm, for further instructions, if necessary.   Insurance Information YOU WILL NEED TO CHECK WITH YOUR INSURANCE COMPANY FOR THE BENEFITS OF COVERAGE YOU HAVE FOR THIS PROCEDURE.  UNFORTUNATELY, NOT ALL INSURANCE COMPANIES HAVE BENEFITS TO COVER ALL OR PART OF THESE TYPES OF PROCEDURES.  IT IS YOUR RESPONSIBILITY TO CHECK YOUR BENEFITS, HOWEVER, WE WILL BE GLAD TO ASSIST YOU WITH ANY CODES YOUR INSURANCE COMPANY MAY NEED.    PLEASE NOTE THAT MOST INSURANCE COMPANIES WILL NOT COVER A SCREENING COLONOSCOPY FOR PEOPLE UNDER THE AGE OF 50  IF YOU HAVE BCBS INSURANCE, YOU MAY HAVE BENEFITS FOR A SCREENING COLONOSCOPY BUT IF POLYPS ARE FOUND THE DIAGNOSIS WILL CHANGE AND THEN YOU MAY HAVE A DEDUCTIBLE THAT WILL NEED TO BE MET. SO PLEASE MAKE SURE YOU CHECK YOUR BENEFITS FOR A SCREENING COLONOSCOPY AS WELL AS A DIAGNOSTIC COLONOSCOPY.

## 2017-06-01 NOTE — Progress Notes (Signed)
Gastroenterology Pre-Procedure Review  Request Date:06/01/17 Requesting Physician: Doylene Canard DO  PATIENT REVIEW QUESTIONS: The patient responded to the following health history questions as indicated:    1. Diabetes Melitis: no 2. Joint replacements in the past 12 months: no 3. Major health problems in the past 3 months: no 4. Has an artificial valve or MVP: no 5. Has a defibrillator: no 6. Has been advised in past to take antibiotics in advance of a procedure like teeth cleaning: no 7. Family history of colon cancer: no  8. Alcohol Use: yes (socially) 9. History of sleep apnea: no  10. History of coronary artery or other vascular stents placed within the last 12 months: no 11. History of any prior anesthesia complications: no    MEDICATIONS & ALLERGIES:    Patient reports the following regarding taking any blood thinners:   Plavix? no Aspirin? no Coumadin? no Brilinta? no Xarelto? no Eliquis? no Pradaxa? no Savaysa? no Effient? no  Patient confirms/reports the following medications:  Current Outpatient Medications  Medication Sig Dispense Refill  . atorvastatin (LIPITOR) 40 MG tablet Take 1 tablet (40 mg total) by mouth daily. 90 tablet 0  . diltiazem (CARDIZEM CD) 240 MG 24 hr capsule Take 1 capsule (240 mg total) by mouth daily. 90 capsule 0  . furosemide (LASIX) 40 MG tablet Take 40 mg by mouth daily.    Marland Kitchen losartan (COZAAR) 50 MG tablet Take 1 tablet (50 mg total) by mouth daily. 90 tablet 0  . omeprazole (PRILOSEC) 20 MG capsule Take 20 mg by mouth daily.    . ranitidine (ZANTAC) 300 MG tablet 300 mg daily.  11  . aspirin 81 MG tablet Take 81 mg by mouth daily.     No current facility-administered medications for this visit.     Patient confirms/reports the following allergies:  No Known Allergies  No orders of the defined types were placed in this encounter.   AUTHORIZATION INFORMATION Primary Insurance: [employer],  [place] #: [id number] Pre-Cert / Berkley Harvey  required: no   SCHEDULE INFORMATION: Procedure has been scheduled as follows:  Date: 07/01/17, Time: 1:00 Location: [facility] Dr.Fields  This Gastroenterology Pre-Precedure Review Form is being routed to the following provider(s): Wynne Dust NP

## 2017-06-02 NOTE — Progress Notes (Signed)
Ok to schedule.

## 2017-06-09 ENCOUNTER — Ambulatory Visit (INDEPENDENT_AMBULATORY_CARE_PROVIDER_SITE_OTHER): Payer: Medicare PPO

## 2017-06-09 ENCOUNTER — Other Ambulatory Visit: Payer: Self-pay

## 2017-06-09 DIAGNOSIS — R011 Cardiac murmur, unspecified: Secondary | ICD-10-CM | POA: Diagnosis not present

## 2017-06-10 ENCOUNTER — Telehealth: Payer: Self-pay

## 2017-06-10 NOTE — Telephone Encounter (Signed)
-----   Message from Lydia M Anderson, LPN sent at 06/10/2017  7:42 AM EST -----   ----- Message ----- From: McDowell, Samuel G, MD Sent: 06/09/2017   1:11 PM To: Lydia M Anderson, LPN, Ashly M Gottschalk, DO  Results reviewed.  LVEF is normal range of 55-60%.  There is some mild aortic valve calcification and mild mitral regurgitation, but no significant valvular disease.  Heart murmur is likely functional and does not require further workup at this time. A copy of this test should be forwarded to Gottschalk, Ashly M, DO. 

## 2017-06-10 NOTE — Telephone Encounter (Signed)
Patient notified. Routed to PCP 

## 2017-06-10 NOTE — Telephone Encounter (Signed)
LMTCB

## 2017-06-10 NOTE — Telephone Encounter (Signed)
-----   Message from Eustace Moore, LPN sent at 08/08/9377  7:42 AM EST -----   ----- Message ----- From: Jonelle Sidle, MD Sent: 06/09/2017   1:11 PM To: Eustace Moore, LPN, Raliegh Ip, DO  Results reviewed.  LVEF is normal range of 55-60%.  There is some mild aortic valve calcification and mild mitral regurgitation, but no significant valvular disease.  Heart murmur is likely functional and does not require further workup at this time. A copy of this test should be forwarded to Raliegh Ip, DO.

## 2017-06-24 ENCOUNTER — Encounter: Payer: Self-pay | Admitting: Family Medicine

## 2017-06-29 ENCOUNTER — Telehealth: Payer: Self-pay

## 2017-06-29 NOTE — Telephone Encounter (Signed)
PT is aware.

## 2017-06-29 NOTE — Telephone Encounter (Signed)
PLEASE CALL PT. IF SHE IS NOT HAVING A FEVER OR PRODUCTIVE COUGH. SHE CAN HAVE HER PROCEDURE FRI.

## 2017-06-29 NOTE — Telephone Encounter (Addendum)
Pt called- she has had a cold for the past 2 days. She has had a runny nose and sneezing. She has not had a fever or a productive cough or body aches. She has been taking dayquil to help with the runny nose and sneezing and wanted to know if it was ok for her to have her procedure done and  to keep taking the cold medication while she was doing her prep. Pt is scheduled for a tcs on Friday. [phone]   Please advise.

## 2017-07-01 ENCOUNTER — Encounter (HOSPITAL_COMMUNITY): Payer: Self-pay | Admitting: Gastroenterology

## 2017-07-01 ENCOUNTER — Other Ambulatory Visit: Payer: Self-pay

## 2017-07-01 ENCOUNTER — Encounter (HOSPITAL_COMMUNITY)
Admission: RE | Disposition: A | Discharge status: Home / Self Care | Payer: Self-pay | Source: Ambulatory Visit | Attending: Gastroenterology

## 2017-07-01 ENCOUNTER — Ambulatory Visit (HOSPITAL_COMMUNITY)
Admission: RE | Admit: 2017-07-01 | Discharge: 2017-07-01 | Disposition: A | Discharge status: Home / Self Care | Payer: Medicare PPO | Source: Ambulatory Visit | Attending: Gastroenterology | Admitting: Gastroenterology

## 2017-07-01 DIAGNOSIS — K644 Residual hemorrhoidal skin tags: Secondary | ICD-10-CM | POA: Diagnosis not present

## 2017-07-01 DIAGNOSIS — M1990 Unspecified osteoarthritis, unspecified site: Secondary | ICD-10-CM | POA: Insufficient documentation

## 2017-07-01 DIAGNOSIS — Z1212 Encounter for screening for malignant neoplasm of rectum: Secondary | ICD-10-CM

## 2017-07-01 DIAGNOSIS — I10 Essential (primary) hypertension: Secondary | ICD-10-CM | POA: Diagnosis not present

## 2017-07-01 DIAGNOSIS — Z87891 Personal history of nicotine dependence: Secondary | ICD-10-CM | POA: Insufficient documentation

## 2017-07-01 DIAGNOSIS — Z538 Procedure and treatment not carried out for other reasons: Secondary | ICD-10-CM | POA: Diagnosis not present

## 2017-07-01 DIAGNOSIS — Z7982 Long term (current) use of aspirin: Secondary | ICD-10-CM | POA: Insufficient documentation

## 2017-07-01 DIAGNOSIS — Q438 Other specified congenital malformations of intestine: Secondary | ICD-10-CM | POA: Diagnosis not present

## 2017-07-01 DIAGNOSIS — Z1211 Encounter for screening for malignant neoplasm of colon: Secondary | ICD-10-CM | POA: Diagnosis not present

## 2017-07-01 DIAGNOSIS — K5730 Diverticulosis of large intestine without perforation or abscess without bleeding: Secondary | ICD-10-CM | POA: Diagnosis not present

## 2017-07-01 DIAGNOSIS — Z79899 Other long term (current) drug therapy: Secondary | ICD-10-CM | POA: Insufficient documentation

## 2017-07-01 DIAGNOSIS — K648 Other hemorrhoids: Secondary | ICD-10-CM | POA: Insufficient documentation

## 2017-07-01 HISTORY — PX: COLONOSCOPY: SHX5424

## 2017-07-01 SURGERY — COLONOSCOPY
Anesthesia: Moderate Sedation

## 2017-07-01 MED ORDER — STERILE WATER FOR IRRIGATION IR SOLN
Status: DC | PRN
  Administered 2017-07-01: 2.5 mL

## 2017-07-01 MED ORDER — MIDAZOLAM HCL 5 MG/5ML IJ SOLN
INTRAMUSCULAR | Status: AC
  Filled 2017-07-01: qty 10

## 2017-07-01 MED ORDER — MEPERIDINE HCL 100 MG/ML IJ SOLN
INTRAMUSCULAR | Status: AC
  Filled 2017-07-01: qty 2

## 2017-07-01 MED ORDER — SODIUM CHLORIDE 0.9 % IV SOLN
INTRAVENOUS | Status: DC
  Administered 2017-07-01: 20 mL/h via INTRAVENOUS

## 2017-07-01 MED ORDER — MIDAZOLAM HCL 5 MG/5ML IJ SOLN
INTRAMUSCULAR | Status: DC | PRN
  Administered 2017-07-01: 1 mg via INTRAVENOUS
  Administered 2017-07-01: 2 mg via INTRAVENOUS
  Administered 2017-07-01: 1 mg via INTRAVENOUS
  Administered 2017-07-01: 2 mg via INTRAVENOUS
  Administered 2017-07-01: 1 mg via INTRAVENOUS

## 2017-07-01 MED ORDER — MEPERIDINE HCL 100 MG/ML IJ SOLN
INTRAMUSCULAR | Status: DC | PRN
  Administered 2017-07-01 (×2): 25 mg via INTRAVENOUS
  Administered 2017-07-01: 50 mg via INTRAVENOUS

## 2017-07-01 NOTE — H&P (Addendum)
Primary Care Physician:  Raliegh Ip, DO Primary Gastroenterologist:  Dr. Darrick Penna  Pre-Procedure History & Physical: HPI:  Kim Wilkerson is a 71 y.o. female here for COLON CANCER SCREENING.  Past Medical History:  Diagnosis Date  . Arthritis   . Hypertension 1980  . Leg ulcer (HCC)   . Varicose veins     Past Surgical History:  Procedure Laterality Date  . ABDOMINAL HYSTERECTOMY  1994  . ESOPHAGEAL DILATION  11/2016   Dr Allena Katz in [place]    Prior to Admission medications   Medication Sig Start Date End Date Taking? Authorizing Provider  acetaminophen (TYLENOL) 650 MG CR tablet Take 1,300 mg by mouth every 8 (eight) hours as needed for pain.   Yes [provider]  aspirin 81 MG tablet Take 81 mg by mouth daily.   Yes [provider]  atorvastatin (LIPITOR) 40 MG tablet Take 1 tablet (40 mg total) by mouth daily. 05/24/17  Yes Gottschalk, Kathie Rhodes M, DO  diltiazem (CARDIZEM CD) 240 MG 24 hr capsule Take 1 capsule (240 mg total) by mouth daily. 05/06/17  Yes Gottschalk, Kathie Rhodes M, DO  furosemide (LASIX) 40 MG tablet Take 40 mg by mouth daily.   Yes [provider]  losartan (COZAAR) 50 MG tablet Take 1 tablet (50 mg total) by mouth daily. 05/06/17  Yes Delynn Flavin M, DO  Multiple Vitamin (MULTIVITAMIN WITH MINERALS) TABS tablet Take 1 tablet by mouth every other day.   Yes [provider]  omeprazole (PRILOSEC) 20 MG capsule Take 20 mg by mouth every evening.    Yes [provider]  polyethylene glycol-electrolytes (TRILYTE) 420 g solution Take 4,000 mLs by mouth as directed. 06/01/17  Yes Anice Paganini, NP  ranitidine (ZANTAC) 300 MG tablet Take 300 mg by mouth daily.  05/02/17  Yes [provider]    Allergies as of 06/01/2017  . (No Known Allergies)    Family History  Problem Relation Age of Onset  . Hypertension Mother   . Stroke Mother        after 26  . Hyperlipidemia Mother   . Thyroid disease Mother   .  Hypertension Father   . Cirrhosis Father   . Alcohol abuse Father   . Heart attack Sister 45  . Hypertension Brother   . Ovarian cancer Neg Hx   . Breast cancer Neg Hx   . Prostate cancer Neg Hx   . Colon cancer Neg Hx     Social History   Socioeconomic History  . Marital status: Widowed    Spouse name: Not on file  . Number of children: 3  . Years of education: Not on file  . Highest education level: Not on file  Social Needs  . Financial resource strain: Not on file  . Food insecurity - worry: Not on file  . Food insecurity - inability: Not on file  . Transportation needs - medical: Not on file  . 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 Smoking status: Former Smoker    Types: Cigarettes    Last attempt to quit: 01/02/1983    Years since quitting: 34.5  . Smokeless tobacco: Never Used  Substance and Sexual Activity  . Alcohol use: Yes    Alcohol/week: 0.0 oz  . Drug use: No  . Sexual activity: Not on file  Other Topics Concern  . Not on file  Social History  Narrative  . Not on file    Review of Systems: See HPI, otherwise negative ROS   Physical Exam: BP (!) 157/91   Pulse 87   Temp 98.3 F (36.8 C) (Oral)   Resp 20   Ht 5\' 8"  (1.727 m)   Wt 271 lb (122.9 kg)   SpO2 99%   BMI 41.21 kg/m  General:   Alert,  pleasant and cooperative in NAD Head:  Normocephalic and atraumatic. Neck:  Supple; Lungs:  Clear throughout to auscultation.    Heart:  Regular rate and rhythm. Abdomen:  Soft, nontender and nondistended. Normal bowel sounds, without guarding, and without rebound.   Neurologic:  Alert and  oriented x4;  grossly normal neurologically.  Impression/Plan:     SCREENING  Plan:  1. TCS TODAY DISCUSSED PROCEDURE, BENEFITS, & RISKS: < 1% chance of medication reaction, bleeding, perforation, or rupture of spleen/liver.

## 2017-07-01 NOTE — Op Note (Signed)
[facility] Patient Name: Kim Wilkerson Procedure Date: 07/01/2017 12:51 PM MRN: [id number] Date of Birth: [birth year] Attending MD: Jonette Eva MD, MD CSN: [id number] Age: 72 Admit Type: Outpatient Procedure:                Colonoscopy, SCREENING Indications:              Screening for colorectal malignant neoplasm Providers:                Jonette Eva MD, MD, Nena Polio, RN, Dyann Ruddle Referring MD:             Rozell Searing. Gottschalk Medicines:                Meperidine 100 mg IV, Midazolam 7 mg IV Complications:            No immediate complications. Estimated Blood Loss:     Estimated blood loss: none. Procedure:                Pre-Anesthesia Assessment:                           - Prior to the procedure, a History and Physical                            was performed, and patient medications and                            allergies were reviewed. The patient's tolerance of                            previous anesthesia was also reviewed. The risks                            and benefits of the procedure and the sedation                            options and risks were discussed with the patient.                            All questions were answered, and informed consent                            was obtained. Prior Anticoagulants: The patient has                            taken aspirin, last dose was 2 days prior to                            procedure. ASA Grade Assessment: II - A patient                            with mild systemic disease. After reviewing the                            risks and benefits, the patient was deemed in  satisfactory condition to undergo the procedure.                            After obtaining informed consent, the colonoscope                            was passed under direct vision. Throughout the                            procedure, the patient's blood pressure, pulse, and                            oxygen  saturations were monitored continuously. The                            EC-[id number] ([id number]) scope was introduced through                            the anus and advanced to the the hepatic flexure.                            The colonoscopy was technically difficult and                            complex due to restricted mobility of the colon THE                            ADULT SCOPE WAS CHANGED TO ULTRA SLIM SCOPE AND PT                            HAD significant looping and the patient's                            discomfort during the procedure SO THE PROCEDURE                            WAS ABORTED. Successful completion of the procedure                            TO TEH HEPATIC FLEXURE was aided by increasing the                            dose of sedation medication, changing the patient                            to a supine position, straightening and shortening                            the scope to obtain bowel loop reduction and                            COLOWRAP. The patient tolerated the procedure  fairly well. The quality of the bowel preparation                            was good. The rectum was photographed. Scope In: 1:25:09 PM Scope Out: 1:49:20 PM Total Procedure Duration: 0 hours 24 minutes 11 seconds  Findings:      Multiple small and large-mouthed diverticula were found in the       recto-sigmoid colon, sigmoid colon, descending colon, splenic flexure       and transverse colon.      The recto-sigmoid colon was grossly tortuous.      The sigmoid colon and descending colon revealed significantly excessive       looping.      External hemorrhoids were found during retroflexion. The hemorrhoids       were small. Impression:               - Diverticulosis in the recto-sigmoid colon, in the                            sigmoid colon, in the descending colon, at the                            splenic flexure and in the transverse colon.                            - Tortuous colon.                           - There was significant looping of the colon.                           - External and internal hemorrhoids. Moderate Sedation:      Moderate (conscious) sedation was administered by the endoscopy nurse       and supervised by the endoscopist. The following parameters were       monitored: oxygen saturation, heart rate, blood pressure, and response       to care. Total physician intraservice time was 40 minutes. Recommendation:           - Repeat colonoscopy in 6 months for surveillance                            WITH PROPOFOL AND COLOWARP.                           - High fiber diet.                           - Continue present medications.                           - Patient has a contact number available for                            emergencies. The signs and symptoms of potential  delayed complications were discussed with the                            patient. Return to normal activities tomorrow.                            Written discharge instructions were provided to the                            patient. Procedure Code(s):        --- Professional ---                           [id number], 53, Colonoscopy, flexible; diagnostic,                            including collection of specimen(s) by brushing or                            washing, when performed (separate procedure)                           99152, Moderate sedation services provided by the                            same physician or other qualified health care                            professional performing the diagnostic or                            therapeutic service that the sedation supports,                            requiring the presence of an independent trained                            observer to assist in the monitoring of the                            patient's level of consciousness and physiological                             status; initial 15 minutes of intraservice time,                            patient age 94 years or older                           [phone], Moderate sedation services; each additional                            15 minutes intraservice time  14431, Moderate sedation services; each additional                            15 minutes intraservice time Diagnosis Code(s):        --- Professional ---                           Z12.11, Encounter for screening for malignant                            neoplasm of colon                           K64.8, Other hemorrhoids                           K57.30, Diverticulosis of large intestine without                            perforation or abscess without bleeding                           Q43.8, Other specified congenital malformations of                            intestine CPT copyright 2016 [employer]. All rights reserved. The codes documented in this report are preliminary and upon coder review may  be revised to meet current compliance requirements. Jonette Eva, MD Jonette Eva MD, MD 07/01/2017 2:16:50 PM This report has been signed electronically. Number of Addenda: 0

## 2017-07-01 NOTE — Discharge Instructions (Signed)
You have SMALL external hemorrhoids. YOU DID NOT HAVE ANY POLYPS.you have diverticulosis in your left colon.  I WAS UNABLE TO COMPLETE YOUR COLONOSCOPY. I SAW TWO THIRDS OF YOUR COLON.   FOLLOW A HIGH FIBER. AVOID ITEMS THAT CAUSE BLOATING. SEE INFO BELOW.  USE PREPERATION H 2 TO 4 TIMES A DAY AS NEEDED FOR RECTAL PRESSURE/PAIN/BLEEDING.  Next colonoscopy in 6 MOS WITH PROPOFOL.   Colonoscopy Care After Read the instructions outlined below and refer to this sheet in the next week. These discharge instructions provide you with general information on caring for yourself after you leave the hospital. While your treatment has been planned according to the most current medical practices available, unavoidable complications occasionally occur. If you have any problems or questions after discharge, call DR. Rony Ratz, [phone].  ACTIVITY  You may resume your regular activity, but move at a slower pace for the next 24 hours.   Take frequent rest periods for the next 24 hours.   Walking will help get rid of the air and reduce the bloated feeling in your belly (abdomen).   No driving for 24 hours (because of the medicine (anesthesia) used during the test).   You may shower.   Do not sign any important legal documents or operate any machinery for 24 hours (because of the anesthesia used during the test).    NUTRITION  Drink plenty of fluids.   You may resume your normal diet as instructed by your doctor.   Begin with a light meal and progress to your normal diet. Heavy or fried foods are harder to digest and may make you feel sick to your stomach (nauseated).   Avoid alcoholic beverages for 24 hours or as instructed.    MEDICATIONS  You may resume your normal medications.   WHAT YOU CAN EXPECT TODAY  Some feelings of bloating in the abdomen.   Passage of more gas than usual.   Spotting of blood in your stool or on the toilet paper  .  IF YOU HAD POLYPS REMOVED DURING THE  COLONOSCOPY:  Eat a soft diet IF YOU HAVE NAUSEA, BLOATING, ABDOMINAL PAIN, OR VOMITING.    FINDING OUT THE RESULTS OF YOUR TEST Not all test results are available during your visit. DR. Darrick Penna WILL CALL YOU WITHIN 7 DAYS OF YOUR PROCEDUE WITH YOUR RESULTS. Do not assume everything is normal if you have not heard from DR. Brannan Cassedy IN ONE WEEK, CALL HER OFFICE AT [phone].  SEEK IMMEDIATE MEDICAL ATTENTION AND CALL THE OFFICE: [phone] IF:  You have more than a spotting of blood in your stool.   Your belly is swollen (abdominal distention).   You are nauseated or vomiting.   You have a temperature over 101F.   You have abdominal pain or discomfort that is severe or gets worse throughout the day.  High-Fiber Diet A high-fiber diet changes your normal diet to include more whole grains, legumes, fruits, and vegetables. Changes in the diet involve replacing refined carbohydrates with unrefined foods. The calorie level of the diet is essentially unchanged. The Dietary Reference Intake (recommended amount) for adult males is 38 grams per day. For adult females, it is 25 grams per day. Pregnant and lactating women should consume 28 grams of fiber per day. Fiber is the intact part of a plant that is not broken down during digestion. Functional fiber is fiber that has been isolated from the plant to provide a beneficial effect in the body. PURPOSE  Increase stool bulk.  Ease and regulate bowel movements.   Lower cholesterol.  INDICATIONS THAT YOU NEED MORE FIBER  Constipation and hemorrhoids.   Uncomplicated diverticulosis (intestine condition) and irritable bowel syndrome.   Weight management.   As a protective measure against hardening of the arteries (atherosclerosis), diabetes, and cancer.   GUIDELINES FOR INCREASING FIBER IN THE DIET  Start adding fiber to the diet slowly. A gradual increase of about 5 more grams (2 slices of whole-wheat bread, 2 servings of most fruits or  vegetables, or 1 bowl of high-fiber cereal) per day is best. Too rapid an increase in fiber may result in constipation, flatulence, and bloating.   Drink enough water and fluids to keep your urine clear or pale yellow. Water, juice, or caffeine-free drinks are recommended. Not drinking enough fluid may cause constipation.   Eat a variety of high-fiber foods rather than one type of fiber.   Try to increase your intake of fiber through using high-fiber foods rather than fiber pills or supplements that contain small amounts of fiber.   The goal is to change the types of food eaten. Do not supplement your present diet with high-fiber foods, but replace foods in your present diet.  INCLUDE A VARIETY OF FIBER SOURCES  Replace refined and processed grains with whole grains, canned fruits with fresh fruits, and incorporate other fiber sources. White rice, white breads, and most bakery goods contain little or no fiber.   Brown whole-grain rice, buckwheat oats, and many fruits and vegetables are all good sources of fiber. These include: broccoli, Brussels sprouts, cabbage, cauliflower, beets, sweet potatoes, white potatoes (skin on), carrots, tomatoes, eggplant, squash, berries, fresh fruits, and dried fruits.   Cereals appear to be the richest source of fiber. Cereal fiber is found in whole grains and bran. Bran is the fiber-rich outer coat of cereal grain, which is largely removed in refining. In whole-grain cereals, the bran remains. In breakfast cereals, the largest amount of fiber is found in those with "bran" in their names. The fiber content is sometimes indicated on the label.   You may need to include additional fruits and vegetables each day.   In baking, for 1 cup white flour, you may use the following substitutions:   1 cup whole-wheat flour minus 2 tablespoons.   1/2 cup white flour plus 1/2 cup whole-wheat flour.    Diverticulosis Diverticulosis is a common condition that develops when  small pouches (diverticula) form in the wall of the colon. The risk of diverticulosis increases with age. It happens more often in people who eat a low-fiber diet. Most individuals with diverticulosis have no symptoms. Those individuals with symptoms usually experience belly (abdominal) pain, constipation, or loose stools (diarrhea).  HOME CARE INSTRUCTIONS  Increase the amount of fiber in your diet as directed by your caregiver or dietician. This may reduce symptoms of diverticulosis.   Drink at least 6 to 8 glasses of water each day to prevent constipation.   Try not to strain when you have a bowel movement.   Avoiding nuts and seeds to prevent complications is NOT NECESSARY.    FOODS HAVING HIGH FIBER CONTENT INCLUDE:  Fruits. Apple, peach, pear, tangerine, raisins, prunes.   Vegetables. Brussels sprouts, asparagus, broccoli, cabbage, carrot, cauliflower, romaine lettuce, spinach, summer squash, tomato, winter squash, zucchini.   Starchy Vegetables. Baked beans, kidney beans, lima beans, split peas, lentils, potatoes (with skin).   Grains. Whole wheat bread, brown rice, bran flake cereal, plain oatmeal, white rice, shredded wheat, bran  muffins.    SEEK IMMEDIATE MEDICAL CARE IF:  You develop increasing pain or severe bloating.   You have an oral temperature above 101F.   You develop vomiting or bowel movements that are bloody or black.    Hemorrhoids Hemorrhoids are dilated (enlarged) veins around the rectum. Sometimes clots will form in the veins. This makes them swollen and painful. These are called thrombosed hemorrhoids. Causes of hemorrhoids include:  Constipation.   Straining to have a bowel movement.   HEAVY LIFTING HOME CARE INSTRUCTIONS  Eat a well balanced diet and drink 6 to 8 glasses of water every day to avoid constipation. You may also use a bulk laxative.   Avoid straining to have bowel movements.   Keep anal area dry and clean.   Do not use a donut  shaped pillow or sit on the toilet for long periods. This increases blood pooling and pain.   Move your bowels when your body has the urge; this will require less straining and will decrease pain and pressure.

## 2017-07-01 NOTE — Progress Notes (Signed)
Patient advised to save her colowrap for her repeat colonscopy in 6 months with propofol. Patient verbalized understanding with daughter.

## 2017-07-04 ENCOUNTER — Encounter (HOSPITAL_COMMUNITY): Payer: Self-pay | Admitting: Gastroenterology

## 2017-08-05 ENCOUNTER — Encounter: Payer: Self-pay | Admitting: Family Medicine

## 2017-08-05 ENCOUNTER — Ambulatory Visit: Payer: Medicare PPO | Admitting: Family Medicine

## 2017-08-05 VITALS — BP 139/78 | HR 68 | Temp 98.4°F | Ht 68.0 in | Wt 279.0 lb

## 2017-08-05 DIAGNOSIS — R010 Benign and innocent cardiac murmurs: Secondary | ICD-10-CM

## 2017-08-05 DIAGNOSIS — E782 Mixed hyperlipidemia: Secondary | ICD-10-CM

## 2017-08-05 DIAGNOSIS — M170 Bilateral primary osteoarthritis of knee: Secondary | ICD-10-CM

## 2017-08-05 DIAGNOSIS — I10 Essential (primary) hypertension: Secondary | ICD-10-CM

## 2017-08-05 MED ORDER — DICLOFENAC SODIUM 75 MG PO TBEC: 75.0000 mg | DELAYED_RELEASE_TABLET | Freq: Two times a day (BID) | ORAL | 0 refills | Status: DC

## 2017-08-05 MED ORDER — ATORVASTATIN CALCIUM 40 MG PO TABS: 40.0000 mg | ORAL_TABLET | Freq: Every day | ORAL | 3 refills | Status: DC

## 2017-08-05 MED ORDER — MELOXICAM 7.5 MG PO TABS: 7.5000 mg | ORAL_TABLET | Freq: Every day | ORAL | 0 refills | Status: DC

## 2017-08-05 NOTE — Progress Notes (Signed)
Subjective: CC: f/u HTN HPI: Kim Wilkerson is a 72 y.o. female presenting to clinic today for:  1. Hypertension/ heart murmur Patient reports Blood pressure at home: Running 135-139/70s; Meds: Compliant with Cozaar, Cardizem and Lasix, Side effects: None.  She reports that she actually has not taken her blood pressure medication this morning because she was in a rush to get here.  She reports having seen the cardiologist and told that her heart murmur was was fine.  Denies headache, dizziness, visual changes, nausea, vomiting, chest pain, LE swelling, abdominal pain or shortness of breath.  2. Arthritis Patient notes that she has quite a bit of arthritis in her left knee.  She was previously using Voltaren gel applied to the knee but she did not find that this is very helpful.  She uses Aleve intermittently with some improvement but not substantial improvement.  She has been using scheduled Tylenol arthritis with little improvement in symptoms.  Denies weakness, falls, numbness or tingling.  3.  Hyperlipidemia/ Morbid Obesity Patient reports compliance with the Lipitor 40 mg daily.  No adverse side effects.  No myalgia.  She reports that she actually fasted with church earlier this year and lost about 11 pounds.  She eliminated carbohydrates entirely.  She notes that since coming off of the 21-day fast, she is essentially regained all of her weight.  She wonders what she can do to improve weight and cholesterol.  She is interested in seeing a dietitian.  ROS: Per HPI  Past Medical History:  Diagnosis Date  . Arthritis   . Hypertension 1980  . Leg ulcer (HCC)   . Varicose veins    No Known Allergies  Current Outpatient Medications:  .  acetaminophen (TYLENOL) 650 MG CR tablet, Take 1,300 mg by mouth every 8 (eight) hours as needed for pain., Disp: , Rfl:  .  aspirin 81 MG tablet, Take 81 mg by mouth daily., Disp: , Rfl:  .  atorvastatin (LIPITOR) 40 MG tablet, Take 1 tablet (40 mg  total) by mouth daily., Disp: 90 tablet, Rfl: 0 .  diltiazem (CARDIZEM CD) 240 MG 24 hr capsule, Take 1 capsule (240 mg total) by mouth daily., Disp: 90 capsule, Rfl: 0 .  furosemide (LASIX) 40 MG tablet, Take 40 mg by mouth daily., Disp: , Rfl:  .  losartan (COZAAR) 50 MG tablet, Take 1 tablet (50 mg total) by mouth daily., Disp: 90 tablet, Rfl: 0 .  Multiple Vitamin (MULTIVITAMIN WITH MINERALS) TABS tablet, Take 1 tablet by mouth every other day., Disp: , Rfl:  .  omeprazole (PRILOSEC) 20 MG capsule, Take 20 mg by mouth every evening. , Disp: , Rfl:  .  ranitidine (ZANTAC) 300 MG tablet, Take 300 mg by mouth daily. , Disp: , Rfl: 11   Social History   Socioeconomic History  . Marital status: Widowed    Spouse name: Not on file  . Number of children: 3  . Years of education: Not on file  . Highest education level: Not on file  Occupational History    Comment: Retired [employer]  . Financial resource strain: Not on file  . Food insecurity:    Worry: Not on file    Inability: Not on file  . Transportation needs:    Medical: Not on file    Non-medical: Not on file  Tobacco Use  . Smoking status: Former Smoker    Types: Cigarettes    Last attempt to quit: 01/02/1983  Years since quitting: 34.6  . Smokeless tobacco: Never Used  Substance and Sexual Activity  . Alcohol use: Yes    Alcohol/week: 0.0 oz  . Drug use: No  . Sexual activity: Not on file  Lifestyle  . Physical activity:    Days per week: Not on file    Minutes per session: Not on file  . Stress: Not on file  Relationships  . Social connections:    Talks on phone: Not on file    Gets together: Not on file    Attends religious service: Not on file    Active member of club or organization: Not on file    Attends meetings of clubs or organizations: Not on file    Relationship status: Not on file  . Intimate partner violence:    Fear of current or ex partner: Not on file    Emotionally abused: Not on  file    Physically abused: Not on file    Forced sexual activity: Not on file  Other Topics Concern  . Not on file  Social History Narrative  . Not on file   Family History  Problem Relation Age of Onset  . Hypertension Mother   . Stroke Mother        after 16  . Hyperlipidemia Mother   . Thyroid disease Mother   . Hypertension Father   . Cirrhosis Father   . Alcohol abuse Father   . Heart attack Sister 59  . Hypertension Brother   . Ovarian cancer Neg Hx   . Breast cancer Neg Hx   . Prostate cancer Neg Hx   . Colon cancer Neg Hx     Health Maintenance: Patient notes some reluctance to have repeat colonoscopy in 6 months.  It appears that her most recent colonoscopy could not be completed secondary to patient's discomfort during exam.  Objective: Office vital signs reviewed. BP 139/78   Pulse 68   Temp 98.4 F (36.9 C) (Oral)   Ht 5\' 8"  (1.727 m)   Wt 279 lb (126.6 kg)   BMI 42.42 kg/m   Physical Examination:  General: Awake, alert, morbidly obese, No acute distress HEENT: Normal    Eyes: PERRLA, extraocular movement in tact, sclera white Cardio: regular rate and rhythm, S1S2 heard, soft systolic murmur at the RSB appreciated Pulm: clear to auscultation bilaterally, no wheezes, rhonchi or rales; normal work of breathing on room air Extremities: warm, well perfused, trace pedal edema, cyanosis or clubbing; +2 pulses bilaterally MSK: normal gait  Left knee: About a 10 degree loss in extension secondary to pain.  Adiposity is somewhat limiting the knee exam.  No gross effusions or discoloration noted. Skin: dry; intact; no rashes or lesions Neuro: Light touch sensation grossly intact.  Assessment/ Plan: 72 y.o. female   1. Primary osteoarthritis of knees, bilateral Voltaren 75 mill grams p.o. twice daily as needed pain prescribed.  Instructions for use reviewed.  Avoid other NSAIDs.  Push oral fluids.  Weight loss recommended.  Referral to medical nutrition  placed. - Amb ref to Medical Nutrition Therapy-MNT  2. Essential hypertension, benign Continue current regimen. - Amb ref to Medical Nutrition Therapy-MNT  3. Morbid obesity (HCC) Nutrition recommendations reviewed.  Reduce carbohydrates.  Increase exercise.  Referral to nutrition placed. - Amb ref to Medical Nutrition Therapy-MNT  4. Mixed hyperlipidemia Recheck direct LDL.  Continue Lipitor.  Nutrition as above. - LDL Cholesterol, Direct  5. Functional heart murmur Benign.  No interventions needed.  Should patient become symptomatic, will consider further workup.   Raliegh Ip, DO [facility] [phone]

## 2017-08-05 NOTE — Patient Instructions (Addendum)
I have placed a referral to the nutritionist to help you with diet and weight loss.  For your knees, and arthritis, I have placed an order for Voltaren to take up to 2 times daily.  You had labs performed today.  You will be contacted with the results of the labs once they are available, usually in the next 3 days for routine lab work.    You have prescribed a nonsteroidal anti-inflammatory drug (NSAID) today. This will help with ear pain and inflammation. Please do not take any other NSAIDs (ibuprofen/Motrin/Advil, naproxen/Aleve, meloxicam/Mobic, Voltaren/diclofenac). Please make sure to eat a meal when taking this medication.   Caution:  If you have a history of acid reflux/indigestion, I recommend that you take an antacid (such as Prilosec, Prevacid) daily while on the NSAID.  If you have a history of bleeding disorder, gastric ulcer, are on a blood thinner (like warfarin/Coumadin, Xarelto, Eliquis, etc) please do not take NSAID.  If you have ever had a heart attack, you should not take NSAIDs.

## 2017-08-06 LAB — LDL CHOLESTEROL, DIRECT: LDL Direct: 50 mg/dL (ref 0–99)

## 2017-09-14 ENCOUNTER — Other Ambulatory Visit: Payer: Self-pay | Admitting: Family Medicine

## 2017-09-26 ENCOUNTER — Encounter: Payer: Self-pay | Admitting: Nutrition

## 2017-09-26 ENCOUNTER — Encounter: Payer: Medicare PPO | Attending: Family Medicine | Admitting: Nutrition

## 2017-09-26 DIAGNOSIS — E782 Mixed hyperlipidemia: Secondary | ICD-10-CM

## 2017-09-26 DIAGNOSIS — I10 Essential (primary) hypertension: Secondary | ICD-10-CM

## 2017-09-26 NOTE — Progress Notes (Signed)
  Medical Nutrition Therapy:  Appt start time: 1100 end time:  1200.   Assessment:  Primary concerns today: OBesity, HTN, Hyperlipidemia. LIves by  herself. She cooks mostly and eats out seldom.  Desires to weigh 180 lbs.  Eats 1 meals per day-skips breakfast and lunch most every day. Snacks some during the day of chips and chocolate and fruit punch.. Admits to eating a lot of junk food, sweets and potato chips.  Physical activity.: ADL.    Attends church weekly and a lot during the week. Doesn't  Can't walk a lot due to knee issues. She wants to go back to the [facility] and start back to doing water aerobics. On cholesterol and BP meds.  No DM. Current diet is insuffient to meet her needs causing weight gain. Diet is insuffient in fresh fruits, whole grains and vegetables. Doesn't drink much water. She is motivated to make changes with her diet to lose weight.      CMP Latest Ref Rng & Units 05/20/2017  Glucose 65 - 99 mg/dL 95  BUN 8 - 27 mg/dL 11  Creatinine 0.34 - 7.42 mg/dL 5.95  Sodium 638 - 756 mmol/L 143  Potassium 3.5 - 5.2 mmol/L 4.4  Chloride 96 - 106 mmol/L 106  CO2 20 - 29 mmol/L 24  Calcium 8.7 - 10.3 mg/dL 8.8  Total Protein 6.0 - 8.5 g/dL 6.5  Total Bilirubin 0.0 - 1.2 mg/dL 0.7  Alkaline Phos 39 - 117 IU/L 136(H)  AST 0 - 40 IU/L 13  ALT 0 - 32 IU/L 8     Lipid Panel     Component Value Date/Time   CHOL 159 05/20/2017 1121   TRIG 72 05/20/2017 1121   HDL 49 05/20/2017 1121   CHOLHDL 3.2 05/20/2017 1121   LDLCALC 96 05/20/2017 1121   LDLDIRECT 50 08/05/2017 1200     Preferred Learning Style:   No preference indicated   Learning Readiness:   Ready  Change in progress   MEDICATIONS:    DIETARY INTAKE:    24-hr recall:  Breakfast skips Lunch: skips D ( PM): Fish fillet from [employer],  Punch Snk ( PM):  Beverages: Punch, water  Usual physical activity: ADL  Estimated energy needs: 1200  calories 135 g carbohydrates 90  g protein 33 g  fat  Progress Towards Goal(s):  In progress.   Nutritional Diagnosis:  NB-1.1 Food and nutrition-related knowledge deficit As related to Obesity.  As evidenced by BMI 41.    Intervention:  Healthy weight loss  education provided on My Plate, CHO counting, meal planning, portion sizes, timing of meals, avoiding snacks between meals , taking medications as prescribed, benefits of exercising 30 minutes per day and prevention of DM. Low salt high fiber diet.  .Goals 1. Follow My Plate 2. Eat three meals a day [facility]. [facility] and [facility] three times per week. 4. Cut out junk food and sweets, Drink only water and cut out fruit punch Lose 1-2 lbs per week Keep a food journal  Teaching Method Utilized:  Visual Auditory Hands on  Handouts given during visit include:  The Plate Method   Meal Plan Card    Barriers to learning/adherence to lifestyle change: knee issues. Demonstrated degree of understanding via:  Teach Back   Monitoring/Evaluation:  Dietary intake, exercise, meal planing , and body weight in 1 month(s).

## 2017-09-26 NOTE — Patient Instructions (Addendum)
Goals 1. Follow My Plate 2. Eat three meals a day 3. [facility] and do [facility] aerobics three times per week. 4. Cut out junk food and sweets, Drink only water and cut out fruit punch Lose 1-2 lbs per week Keep a food journal

## 2017-09-27 ENCOUNTER — Encounter: Payer: Self-pay | Admitting: Family Medicine

## 2017-09-27 ENCOUNTER — Ambulatory Visit (INDEPENDENT_AMBULATORY_CARE_PROVIDER_SITE_OTHER): Payer: Medicare PPO | Admitting: Family Medicine

## 2017-09-27 ENCOUNTER — Telehealth: Payer: Self-pay | Admitting: Family Medicine

## 2017-09-27 VITALS — BP 136/74 | HR 70 | Temp 97.0°F | Ht 68.0 in | Wt 276.0 lb

## 2017-09-27 DIAGNOSIS — N3001 Acute cystitis with hematuria: Secondary | ICD-10-CM | POA: Diagnosis not present

## 2017-09-27 LAB — URINALYSIS, COMPLETE
Bilirubin, UA: NEGATIVE
GLUCOSE, UA: NEGATIVE
Ketones, UA: NEGATIVE
NITRITE UA: POSITIVE — AB
PH UA: 5.5 (ref 5.0–7.5)
Specific Gravity, UA: 1.02 (ref 1.005–1.030)
Urobilinogen, Ur: 0.2 mg/dL (ref 0.2–1.0)

## 2017-09-27 LAB — MICROSCOPIC EXAMINATION: RENAL EPITHEL UA: NONE SEEN /HPF

## 2017-09-27 MED ORDER — PHENAZOPYRIDINE HCL 200 MG PO TABS
200.0000 mg | ORAL_TABLET | Freq: Three times a day (TID) | ORAL | 0 refills | Status: DC | PRN
Start: 2017-09-27 — End: 2017-11-28

## 2017-09-27 MED ORDER — CEPHALEXIN 500 MG PO CAPS: 500.0000 mg | ORAL_CAPSULE | Freq: Two times a day (BID) | ORAL | 0 refills | Status: AC

## 2017-09-27 MED ORDER — FLUCONAZOLE 150 MG PO TABS: 150.0000 mg | ORAL_TABLET | Freq: Once | ORAL | 0 refills | Status: AC

## 2017-09-27 NOTE — Patient Instructions (Signed)

## 2017-09-27 NOTE — Telephone Encounter (Signed)
Pt scheduled apt.

## 2017-09-27 NOTE — Progress Notes (Signed)
Subjective: CC: UTI PCP: Raliegh Ip, DO QGB:EEFE D Muller is a 72 y.o. female presenting to clinic today for:  1. Urinary symptoms Patient reports a 1 day h/o dysuria, urinary frequency and urinary urgency.  She reports that she is laid off sodas but has been drinking fruit punch.  She actually saw the nutritionist yesterday who did recommend that she stop drinking sugary beverages.  Denies hematuria, fevers, chills, abdominal pain, nausea, vomiting, back pain, vaginal discharge.  Patient has used nothing for symptoms.  Patient denies a h/o frequent or recurrent UTIs.      ROS: Per HPI  No Known Allergies Past Medical History:  Diagnosis Date  . Arthritis   . Functional heart murmur 2019  . Hypertension 1980  . Leg ulcer (HCC)   . Morbid obesity (HCC)   . Varicose veins     Current Outpatient Medications:  .  aspirin 81 MG tablet, Take 81 mg by mouth daily., Disp: , Rfl:  .  atorvastatin (LIPITOR) 40 MG tablet, Take 1 tablet (40 mg total) by mouth daily., Disp: 90 tablet, Rfl: 3 .  diltiazem (CARDIZEM CD) 240 MG 24 hr capsule, Take 1 capsule (240 mg total) by mouth daily., Disp: 90 capsule, Rfl: 0 .  furosemide (LASIX) 40 MG tablet, Take 40 mg by mouth daily., Disp: , Rfl:  .  losartan (COZAAR) 50 MG tablet, TAKE 1 TABLET(50 MG) BY MOUTH DAILY, Disp: 90 tablet, Rfl: 1 .  omeprazole (PRILOSEC) 20 MG capsule, Take 20 mg by mouth every evening. , Disp: , Rfl:  .  ranitidine (ZANTAC) 300 MG tablet, Take 300 mg by mouth daily. , Disp: , Rfl: 11 .  diclofenac (VOLTAREN) 75 MG EC tablet, Take 1 tablet (75 mg total) by mouth 2 (two) times daily. (if needed for arthritis) (Patient not taking: Reported on 09/26/2017), Disp: 30 tablet, Rfl: 0 Social History   Socioeconomic History  . Marital status: Widowed    Spouse name: Not on file  . Number of children: 3  . Years of education: Not on file  . Highest education level: Not on file  Occupational History    Comment: Retired  [employer]  . Financial resource strain: Not on file  . Food insecurity:    Worry: Not on file    Inability: Not on file  . Transportation needs:    Medical: Not on file    Non-medical: Not on file  Tobacco Use  . Smoking status: Former Smoker    Types: Cigarettes    Last attempt to quit: 01/02/1983    Years since quitting: 34.7  . Smokeless tobacco: Never Used  Substance and Sexual Activity  . Alcohol use: Yes    Alcohol/week: 0.0 oz  . Drug use: No  . Sexual activity: Not on file  Lifestyle  . Physical activity:    Days per week: Not on file    Minutes per session: Not on file  . Stress: Not on file  Relationships  . Social connections:    Talks on phone: Not on file    Gets together: Not on file    Attends religious service: Not on file    Active member of club or organization: Not on file    Attends meetings of clubs or organizations: Not on file    Relationship status: Not on file  . Intimate partner violence:    Fear of current or ex partner: Not on file    Emotionally abused:  Not on file    Physically abused: Not on file    Forced sexual activity: Not on file  Other Topics Concern  . Not on file  Social History Narrative  . Not on file   Family History  Problem Relation Age of Onset  . Hypertension Mother   . Stroke Mother        after 52  . Hyperlipidemia Mother   . Thyroid disease Mother   . Hypertension Father   . Cirrhosis Father   . Alcohol abuse Father   . Heart attack Sister 58  . Hypertension Brother   . Ovarian cancer Neg Hx   . Breast cancer Neg Hx   . Prostate cancer Neg Hx   . Colon cancer Neg Hx     Objective: Office vital signs reviewed. BP 136/74   Pulse 70   Temp (!) 97 F (36.1 C) (Oral)   Ht 5\' 8"  (1.727 m)   Wt 276 lb (125.2 kg)   BMI 41.97 kg/m   Physical Examination:  General: Awake, alert, obese, No acute distress GU: no suprapubic TTP MSK: no CVA TTP  Assessment/ Plan: 72 y.o. female   1. Acute  cystitis with hematuria Patient is afebrile and nontoxic-appearing.  Her urinalysis was remarkable for 2+ leukocytes, nitrite positive, 1+ protein and 2+ blood.  Urine microscopy with greater than 30 white blood cells, 3-10 red blood cells and many bacteria.  This is been sent for urine culture.  I put her on Keflex p.o. twice daily for the next 7 days.  Pyridium 200 mg 3 times daily as needed dysuria.  And Diflucan if needed for yeast infection.  Will contact patient with urine culture results.  Home care instructions were reviewed.  Push clear fluids.  Avoid sugary beverages.  Follow-up as needed. - Urinalysis, Complete - Urine Culture   Orders Placed This Encounter  Procedures  . Urine Culture  . Urinalysis, Complete   Meds ordered this encounter  Medications  . cephALEXin (KEFLEX) 500 MG capsule    Sig: Take 1 capsule (500 mg total) by mouth 2 (two) times daily for 7 days.    Dispense:  14 capsule    Refill:  0  . phenazopyridine (PYRIDIUM) 200 MG tablet    Sig: Take 1 tablet (200 mg total) by mouth 3 (three) times daily as needed for pain.    Dispense:  6 tablet    Refill:  0  . fluconazole (DIFLUCAN) 150 MG tablet    Sig: Take 1 tablet (150 mg total) by mouth once for 1 dose. If needed for yeast infection    Dispense:  1 tablet    Refill:  0     Ashly 80, DO [facility] [phone]

## 2017-09-29 LAB — URINE CULTURE

## 2017-10-13 ENCOUNTER — Other Ambulatory Visit: Payer: Self-pay | Admitting: Family Medicine

## 2017-11-08 ENCOUNTER — Encounter: Payer: Self-pay | Admitting: Gastroenterology

## 2017-11-21 ENCOUNTER — Other Ambulatory Visit: Payer: Self-pay | Admitting: Family Medicine

## 2017-11-23 ENCOUNTER — Ambulatory Visit: Payer: Medicare PPO | Admitting: Nutrition

## 2017-11-28 ENCOUNTER — Ambulatory Visit (INDEPENDENT_AMBULATORY_CARE_PROVIDER_SITE_OTHER): Payer: 59

## 2017-11-28 ENCOUNTER — Ambulatory Visit: Payer: 59 | Admitting: Family Medicine

## 2017-11-28 ENCOUNTER — Encounter: Payer: Self-pay | Admitting: Family Medicine

## 2017-11-28 VITALS — BP 138/87 | HR 75 | Temp 97.4°F | Ht 68.0 in | Wt 278.0 lb

## 2017-11-28 DIAGNOSIS — M79641 Pain in right hand: Secondary | ICD-10-CM | POA: Diagnosis not present

## 2017-11-28 MED ORDER — PREDNISONE 10 MG PO TABS: ORAL_TABLET | ORAL | 0 refills | Status: DC

## 2017-11-28 NOTE — Progress Notes (Signed)
Chief Complaint  Patient presents with  . Hand Pain    pt here today c/o right hand pain and swelling/stiffness when she tries to straighten her fingers out    HPI  Patient presents today for 2 weeks of increasing pain in the right hand.  No known injury.  She has to use her left hand to straighten out the fingers of her right hand.  The pain is focused in the second and third fingers.  It is 8/10.  The patient is right-handed and it interferes with routine activities including ADLs such as washing dishes, preparing meals etc.  PMH: Smoking status noted ROS: Per HPI  Objective: BP 138/87   Pulse 75   Temp (!) 97.4 F (36.3 C) (Oral)   Ht 5\' 8"  (1.727 m)   Wt 278 lb (126.1 kg)   BMI 42.27 kg/m  Gen: NAD, alert, cooperative with exam Ext: No edema, warm decreased active range of motion for flexion and extension of MCPs and PCPs 2 through 5.  Passively she can move 2, 4, 5 easily.  MCP 3 is exquisitely tender for all range of motion and for palpation. Neuro: Alert and oriented, No gross deficits right upper extremity is neurologically intact X-ray shows no acute changes no obvious arthritic changes. Assessment and plan:  No diagnosis found.  No orders of the defined types were placed in this encounter.   No orders of the defined types were placed in this encounter.   Follow up as needed.  , MD

## 2017-11-29 LAB — CBC WITH DIFFERENTIAL/PLATELET
BASOS ABS: 0 10*3/uL (ref 0.0–0.2)
Basos: 0 %
EOS (ABSOLUTE): 0.1 10*3/uL (ref 0.0–0.4)
Eos: 1 %
HEMATOCRIT: 37.8 % (ref 34.0–46.6)
Hemoglobin: 11.9 g/dL (ref 11.1–15.9)
Immature Grans (Abs): 0 10*3/uL (ref 0.0–0.1)
Immature Granulocytes: 0 %
LYMPHS ABS: 1.7 10*3/uL (ref 0.7–3.1)
Lymphs: 25 %
MCH: 25.3 pg — AB (ref 26.6–33.0)
MCHC: 31.5 g/dL (ref 31.5–35.7)
MCV: 80 fL (ref 79–97)
MONOS ABS: 0.5 10*3/uL (ref 0.1–0.9)
Monocytes: 8 %
NEUTROS ABS: 4.5 10*3/uL (ref 1.4–7.0)
Neutrophils: 66 %
Platelets: 258 10*3/uL (ref 150–450)
RBC: 4.7 x10E6/uL (ref 3.77–5.28)
RDW: 16.9 % — AB (ref 12.3–15.4)
WBC: 6.8 10*3/uL (ref 3.4–10.8)

## 2017-11-29 LAB — SEDIMENTATION RATE: SED RATE: 21 mm/h (ref 0–40)

## 2017-11-29 LAB — URIC ACID: Uric Acid: 6.1 mg/dL (ref 2.5–7.1)

## 2017-12-21 ENCOUNTER — Other Ambulatory Visit: Payer: Self-pay | Admitting: Family Medicine

## 2018-01-19 ENCOUNTER — Telehealth: Payer: Self-pay | Admitting: Family Medicine
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
## 2018-01-20 ENCOUNTER — Ambulatory Visit: Payer: 59 | Admitting: Family Medicine

## 2018-01-30 ENCOUNTER — Ambulatory Visit: Payer: 59 | Admitting: Family Medicine

## 2018-02-03 ENCOUNTER — Ambulatory Visit: Payer: 59 | Admitting: Family Medicine

## 2018-02-06 ENCOUNTER — Encounter: Payer: Self-pay | Admitting: Family Medicine

## 2018-02-06 ENCOUNTER — Ambulatory Visit (INDEPENDENT_AMBULATORY_CARE_PROVIDER_SITE_OTHER): Payer: Medicare PPO | Admitting: Family Medicine

## 2018-02-06 VITALS — BP 138/79 | HR 64 | Temp 97.8°F | Ht 68.0 in | Wt 276.0 lb

## 2018-02-06 DIAGNOSIS — I10 Essential (primary) hypertension: Secondary | ICD-10-CM

## 2018-02-06 DIAGNOSIS — M7061 Trochanteric bursitis, right hip: Secondary | ICD-10-CM | POA: Diagnosis not present

## 2018-02-06 MED ORDER — DICLOFENAC SODIUM 75 MG PO TBEC: 75.0000 mg | DELAYED_RELEASE_TABLET | Freq: Two times a day (BID) | ORAL | 0 refills | Status: DC

## 2018-02-06 MED ORDER — DILTIAZEM HCL ER COATED BEADS 240 MG PO CP24: 240.0000 mg | ORAL_CAPSULE | Freq: Every day | ORAL | 3 refills | Status: DC

## 2018-02-06 MED ORDER — METHYLPREDNISOLONE ACETATE 80 MG/ML IJ SUSP
40.0000 mg | Freq: Once | INTRAMUSCULAR | Status: AC
  Administered 2018-02-06: 40 mg via INTRAMUSCULAR

## 2018-02-06 MED ORDER — RANITIDINE HCL 150 MG PO TABS: ORAL_TABLET | ORAL | 4 refills | Status: DC

## 2018-02-06 MED ORDER — ATORVASTATIN CALCIUM 40 MG PO TABS: 40.0000 mg | ORAL_TABLET | Freq: Every day | ORAL | 3 refills | Status: DC

## 2018-02-06 MED ORDER — OMEPRAZOLE 20 MG PO CPDR: 20.0000 mg | DELAYED_RELEASE_CAPSULE | Freq: Two times a day (BID) | ORAL | 4 refills | Status: DC

## 2018-02-06 MED ORDER — TIZANIDINE HCL 4 MG PO TABS: 2.0000 mg | ORAL_TABLET | Freq: Three times a day (TID) | ORAL | 0 refills | Status: DC | PRN

## 2018-02-06 MED ORDER — LOSARTAN POTASSIUM 50 MG PO TABS: ORAL_TABLET | ORAL | 1 refills | Status: DC

## 2018-02-06 NOTE — Patient Instructions (Signed)
Trochanteric Bursitis Trochanteric bursitis is a condition that causes hip pain. Trochanteric bursitis happens when fluid-filled sacs (bursae) in the hip get irritated. Normally these sacs absorb shock and help strong bands of tissue (tendons) in your hip glide smoothly over each other and over your hip bones. What are the causes? This condition results from increased friction between the hip bones and the tendons that go over them. This condition can happen if you:  Have weak hips.  Use your hip muscles too much (overuse).  Get hit in the hip.  What increases the risk? This condition is more likely to develop in:  Women.  Adults who are middle-aged or older.  People with arthritis or a spinal condition.  People with weak buttocks muscles (gluteal muscles).  People who have one leg that is shorter than the other.  People who participate in certain kinds of athletic activities, such as: ? Running sports, especially long-distance running. ? Contact sports, like football or martial arts. ? Sports in which falls may occur, like skiing.  What are the signs or symptoms? The main symptom of this condition is pain and tenderness over the point of your hip. The pain may be:  Sharp and intense.  Dull and achy.  Felt on the outside of your thigh.  It may increase when you:  Lie on your side.  Walk or run.  Go up on stairs.  Sit.  Stand up after sitting.  Stand for long periods of time.  How is this diagnosed? This condition may be diagnosed based on:  Your symptoms.  Your medical history.  A physical exam.  Imaging tests, such as: ? X-rays to check your bones. ? An MRI or ultrasound to check your tendons and muscles.  During your physical exam, your health care provider will check the movement and strength of your hip. He or she may press on the point of your hip to check for pain. How is this treated? This condition may be treated by:  Resting.  Reducing  your activity.  Avoiding activities that cause pain.  Using crutches, a cane, or a walker to decrease the strain on your hip.  Taking medicine to help with swelling.  Having medicine injected into the bursae to help with swelling.  Using ice, heat, and massage therapy for pain relief.  Physical therapy exercises for strength and flexibility.  Surgery (rare).  Follow these instructions at home: Activity  Rest.  Avoid activities that cause pain.  Return to your normal activities as told by your health care provider. Ask your health care provider what activities are safe for you. Managing pain, stiffness, and swelling  Take over-the-counter and prescription medicines only as told by your health care provider.  If directed, apply heat to the injured area as told by your health care provider. ? Place a towel between your skin and the heat source. ? Leave the heat on for 20-30 minutes. ? Remove the heat if your skin turns bright red. This is especially important if you are unable to feel pain, heat, or cold. You may have a greater risk of getting burned.  If directed, apply ice to the injured area: ? Put ice in a plastic bag. ? Place a towel between your skin and the bag. ? Leave the ice on for 20 minutes, 2-3 times a day. General instructions  If the affected leg is one that you use for driving, ask your health care provider when it is safe to drive.    Use crutches, a cane, or a walker as told by your health care provider.  If one of your legs is shorter than the other, get fitted for a shoe insert.  Lose weight if you are overweight. How is this prevented?  Wear supportive footwear that is appropriate for your sport.  If you have hip pain, start any new exercise or sport slowly.  Maintain physical fitness, including: ? Strength. ? Flexibility. Contact a health care provider if:  Your pain does not improve with 2-4 weeks. Get help right away if:  You develop  severe pain.  You have a fever.  You develop increased redness over your hip.  You have a change in your bowel function or bladder function.  You cannot control the muscles in your feet. This information is not intended to replace advice given to you by your health care provider. Make sure you discuss any questions you have with your health care provider. Document Released: 06/03/2004 Document Revised: 12/31/2015 Document Reviewed: 04/11/2015 Elsevier Interactive Patient Education  2018 Elsevier Inc.  
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
## 2018-02-22 ENCOUNTER — Telehealth: Payer: Self-pay | Admitting: Family Medicine

## 2018-02-22 ENCOUNTER — Other Ambulatory Visit: Payer: Self-pay | Admitting: *Deleted

## 2018-02-22 MED ORDER — DILTIAZEM HCL ER COATED BEADS 240 MG PO CP24: 240.0000 mg | ORAL_CAPSULE | Freq: Every day | ORAL | 3 refills | Status: DC

## 2018-02-22 MED ORDER — OMEPRAZOLE 20 MG PO CPDR: 20.0000 mg | DELAYED_RELEASE_CAPSULE | Freq: Two times a day (BID) | ORAL | 4 refills | Status: DC

## 2018-02-22 MED ORDER — LOSARTAN POTASSIUM 50 MG PO TABS: ORAL_TABLET | ORAL | 1 refills | Status: DC

## 2018-02-22 MED ORDER — DICLOFENAC SODIUM 75 MG PO TBEC: 75.0000 mg | DELAYED_RELEASE_TABLET | Freq: Two times a day (BID) | ORAL | 0 refills | Status: DC

## 2018-02-22 MED ORDER — RANITIDINE HCL 150 MG PO TABS: ORAL_TABLET | ORAL | 4 refills | Status: DC

## 2018-02-22 MED ORDER — FUROSEMIDE 40 MG PO TABS: 40.0000 mg | ORAL_TABLET | Freq: Every day | ORAL | 0 refills | Status: DC

## 2018-02-22 MED ORDER — ATORVASTATIN CALCIUM 40 MG PO TABS: 40.0000 mg | ORAL_TABLET | Freq: Every day | ORAL | 3 refills | Status: DC

## 2018-02-22 MED ORDER — TIZANIDINE HCL 4 MG PO TABS: 2.0000 mg | ORAL_TABLET | Freq: Three times a day (TID) | ORAL | 0 refills | Status: DC | PRN

## 2018-02-22 NOTE — Telephone Encounter (Signed)
FYI for provider  All scripts were sent to correct pharmacy, thus reorder.

## 2018-04-10 ENCOUNTER — Other Ambulatory Visit: Payer: Self-pay | Admitting: Family Medicine

## 2018-04-10 ENCOUNTER — Telehealth: Payer: Self-pay | Admitting: Family Medicine

## 2018-04-10 DIAGNOSIS — M069 Rheumatoid arthritis, unspecified: Secondary | ICD-10-CM

## 2018-04-10 NOTE — Telephone Encounter (Signed)
I was unaware that patient had an autoimmune disease.  What is she wanting to see rheumatology for?  If she has a known autoimmune disease then ok to refer.  Otherwise, NTBS

## 2018-04-10 NOTE — Telephone Encounter (Signed)
Pt states she has had RA and that's why she needs to go to Rheumatology. Referral placed as pt requested.

## 2018-04-10 NOTE — Telephone Encounter (Signed)
Ok for referral or does pt ntbs? Last seen 02/06/18

## 2018-05-17 ENCOUNTER — Encounter: Payer: Self-pay | Admitting: Family Medicine

## 2018-05-17 ENCOUNTER — Ambulatory Visit (INDEPENDENT_AMBULATORY_CARE_PROVIDER_SITE_OTHER): Payer: Medicare PPO | Admitting: Family Medicine

## 2018-05-17 VITALS — BP 140/87 | HR 88 | Temp 97.9°F | Ht 69.0 in | Wt 272.0 lb

## 2018-05-17 DIAGNOSIS — K219 Gastro-esophageal reflux disease without esophagitis: Secondary | ICD-10-CM | POA: Diagnosis not present

## 2018-05-17 DIAGNOSIS — R1310 Dysphagia, unspecified: Secondary | ICD-10-CM | POA: Diagnosis not present

## 2018-05-17 DIAGNOSIS — R1110 Vomiting, unspecified: Secondary | ICD-10-CM

## 2018-05-17 MED ORDER — PANTOPRAZOLE SODIUM 40 MG PO TBEC: 40.0000 mg | DELAYED_RELEASE_TABLET | Freq: Every day | ORAL | 3 refills | Status: DC

## 2018-05-17 NOTE — Progress Notes (Signed)
Subjective: CC: difficulty swallowing PCP: Raliegh Ip, DO RZN:BVAP D Labrum is a 73 y.o. female presenting to clinic today for:  1. Difficulty swallowing Patient reports several month history of difficulty with swallowing and sensation of choking on food and regurgitation in order to fully swallow food.  She notes that this has resulted in some weight loss, which she is not totally sad about.  She denies any medic easy, melena, abdominal pain.  No shortness of breath or aspiration.  She has a history of possible esophageal stretch procedure in the past.  She certainly has had EGD in the past.  She goes on to state that the omeprazole 20 mg p.o. twice daily is not especially helpful controlling reflux symptoms.  She would like to switch to an alternative and also get a referral to the GI specialist in [place], as she was unable to secure an appointment with Dr. Evelina Dun office until February and does not want a wait this long.   ROS: Per HPI  No Known Allergies Past Medical History:  Diagnosis Date  . Arthritis   . Functional heart murmur 2019  . Hypertension 1980  . Leg ulcer (HCC)   . Morbid obesity (HCC)   . Varicose veins     Current Outpatient Medications:  .  aspirin 81 MG tablet, Take 81 mg by mouth daily., Disp: , Rfl:  .  atorvastatin (LIPITOR) 40 MG tablet, Take 1 tablet (40 mg total) by mouth daily., Disp: 90 tablet, Rfl: 3 .  diclofenac (VOLTAREN) 75 MG EC tablet, Take 1 tablet (75 mg total) by mouth 2 (two) times daily. (if needed for arthritis), Disp: 30 tablet, Rfl: 0 .  diltiazem (CARDIZEM CD) 240 MG 24 hr capsule, Take 1 capsule (240 mg total) by mouth daily., Disp: 90 capsule, Rfl: 3 .  furosemide (LASIX) 40 MG tablet, TAKE 1 TABLET(40 MG) BY MOUTH DAILY, Disp: 30 tablet, Rfl: 2 .  losartan (COZAAR) 50 MG tablet, TAKE 1 TABLET(50 MG) BY MOUTH DAILY, Disp: 90 tablet, Rfl: 1 .  omeprazole (PRILOSEC) 20 MG capsule, Take 1 capsule (20 mg total) by mouth 2 (two)  times daily., Disp: 60 capsule, Rfl: 4 .  ranitidine (ZANTAC) 150 MG tablet, TAKE 1 TABLET(150 MG) BY MOUTH TWICE DAILY AS NEEDED FOR HEARTBURN, Disp: 60 tablet, Rfl: 4 .  tiZANidine (ZANAFLEX) 4 MG tablet, Take 0.5-1 tablets (2-4 mg total) by mouth every 8 (eight) hours as needed for muscle spasms., Disp: 30 tablet, Rfl: 0 Social History   Socioeconomic History  . Marital status: Widowed    Spouse name: Not on file  . Number of children: 3  . Years of education: Not on file  . Highest education level: Not on file  Occupational History    Comment: Retired [employer]  . Financial resource strain: Not on file  . Food insecurity:    Worry: Not on file    Inability: Not on file  . Transportation needs:    Medical: Not on file    Non-medical: Not on file  Tobacco Use  . Smoking status: Former Smoker    Types: Cigarettes    Last attempt to quit: 01/02/1983    Years since quitting: 35.3  . Smokeless tobacco: Never Used  Substance and Sexual Activity  . Alcohol use: Yes    Alcohol/week: 0.0 standard drinks  . Drug use: No  . Sexual activity: Not on file  Lifestyle  . Physical activity:    Days per  week: Not on file    Minutes per session: Not on file  . Stress: Not on file  Relationships  . Social connections:    Talks on phone: Not on file    Gets together: Not on file    Attends religious service: Not on file    Active member of club or organization: Not on file    Attends meetings of clubs or organizations: Not on file    Relationship status: Not on file  . Intimate partner violence:    Fear of current or ex partner: Not on file    Emotionally abused: Not on file    Physically abused: Not on file    Forced sexual activity: Not on file  Other Topics Concern  . Not on file  Social History Narrative  . Not on file   Family History  Problem Relation Age of Onset  . Hypertension Mother   . Stroke Mother        after 8365  . Hyperlipidemia Mother   . Thyroid  disease Mother   . Hypertension Father   . Cirrhosis Father   . Alcohol abuse Father   . Heart attack Sister [id number]  . Hypertension Brother   . Ovarian cancer Neg Hx   . Breast cancer Neg Hx   . Prostate cancer Neg Hx   . Colon cancer Neg Hx     Objective: Office vital signs reviewed. BP 140/87   Pulse 88   Temp 97.9 F (36.6 C) (Oral)   Ht 5\' 9"  (1.753 m)   Wt 272 lb (123.4 kg)   BMI 40.17 kg/m   Physical Examination:  General: Awake, alert, well nourished, No acute distress HEENT: Normal    Neck: No masses palpated. No lymphadenopathy    Throat: moist mucus membranes, no erythema, no appreciable oropharyngeal masses or obstruction. airway is patent Cardio: regular rate and rhythm, S1S2 heard, no murmurs appreciated Pulm: clear to auscultation bilaterally, no wheezes, rhonchi or rales; normal work of breathing on room air  Assessment/ Plan: 73 y.o. female   1. Regurgitation of food She has had about a 4 pound weight loss since our last visit.  Does not exhibit malnourishment at this time.  However, there is concern for esophageal narrowing/stricture.  There were no visible obstructions within the oropharynx on my exam.  Certainly no protrusions from the neck or anything to suggest mass contributing to symptoms.  I placed a referral to the GI of her choice. - Ambulatory referral to Gastroenterology  2. Difficulty swallowing solids As above - Ambulatory referral to Gastroenterology  3. Gastroesophageal reflux disease, esophagitis presence not specified Discontinue omeprazole 20 mg p.o. twice daily.  Start Protonix 40 mg daily.  May be related to possible esophageal stricture as above.  We discussed that we will await GIs input and they can decide whether or not she needs to go to Protonix 40 mg twice daily. - Ambulatory referral to Gastroenterology   Orders Placed This Encounter  Procedures  . Ambulatory referral to Gastroenterology    Referral Priority:   Urgent     Referral Type:   Consultation    Referral Reason:   Specialty Services Required    Number of Visits Requested:   1   Meds ordered this encounter  Medications  . pantoprazole (PROTONIX) 40 MG tablet    Sig: Take 1 tablet (40 mg total) by mouth daily.    Dispense:  30 tablet    Refill:  3  Janora Norlander, DO [facility] [phone]

## 2018-05-17 NOTE — Patient Instructions (Signed)
We have replaced your Omeprazole with Pantoprazole.    Referral to [facility] placed.

## 2018-05-21 DIAGNOSIS — I10 Essential (primary) hypertension: Secondary | ICD-10-CM | POA: Diagnosis not present

## 2018-05-21 DIAGNOSIS — S0501XA Injury of conjunctiva and corneal abrasion without foreign body, right eye, initial encounter: Secondary | ICD-10-CM | POA: Diagnosis not present

## 2018-05-22 DIAGNOSIS — S0501XA Injury of conjunctiva and corneal abrasion without foreign body, right eye, initial encounter: Secondary | ICD-10-CM | POA: Diagnosis not present

## 2018-05-23 DIAGNOSIS — S0501XD Injury of conjunctiva and corneal abrasion without foreign body, right eye, subsequent encounter: Secondary | ICD-10-CM | POA: Diagnosis not present

## 2018-05-24 DIAGNOSIS — Z1211 Encounter for screening for malignant neoplasm of colon: Secondary | ICD-10-CM | POA: Diagnosis not present

## 2018-05-24 DIAGNOSIS — R1310 Dysphagia, unspecified: Secondary | ICD-10-CM | POA: Diagnosis not present

## 2018-05-24 DIAGNOSIS — R1013 Epigastric pain: Secondary | ICD-10-CM | POA: Diagnosis not present

## 2018-05-30 ENCOUNTER — Telehealth: Payer: Self-pay | Admitting: Gastroenterology

## 2018-05-30 NOTE — Telephone Encounter (Signed)
Noted  

## 2018-05-30 NOTE — Telephone Encounter (Signed)
Pt was referred to Paralee Cancel by [facility] and was seen in their office on 05/24/2018. Patient also has OV with [facility] on 06/28/2018. I spoke to [facility] and they were going to call patient to figure out which GI patient was going to see that she can't see both. Patient called and cancelled her OV with [place] and will be seeing Paralee Cancel.

## 2018-05-30 NOTE — Telephone Encounter (Addendum)
REVIEWED. CANCEL ALL FUTURE APPTS. PT IS TRANSFERRING CARE TO [facility]. SHE WILL NO LONGER BE A PT OF [facility].

## 2018-06-01 DIAGNOSIS — N3000 Acute cystitis without hematuria: Secondary | ICD-10-CM | POA: Diagnosis not present

## 2018-06-07 ENCOUNTER — Encounter: Payer: Self-pay | Admitting: Family Medicine

## 2018-06-07 DIAGNOSIS — K2100 Gastro-esophageal reflux disease with esophagitis, without bleeding: Secondary | ICD-10-CM | POA: Insufficient documentation

## 2018-06-07 DIAGNOSIS — R1310 Dysphagia, unspecified: Secondary | ICD-10-CM | POA: Diagnosis not present

## 2018-06-07 DIAGNOSIS — K222 Esophageal obstruction: Secondary | ICD-10-CM | POA: Diagnosis not present

## 2018-06-07 DIAGNOSIS — K209 Esophagitis, unspecified: Secondary | ICD-10-CM | POA: Diagnosis not present

## 2018-06-07 DIAGNOSIS — R1013 Epigastric pain: Secondary | ICD-10-CM | POA: Diagnosis not present

## 2018-06-12 ENCOUNTER — Ambulatory Visit: Payer: Medicare PPO | Admitting: Family Medicine

## 2018-06-19 ENCOUNTER — Ambulatory Visit: Payer: Medicare PPO | Admitting: Family Medicine

## 2018-06-19 VITALS — BP 117/69 | HR 71 | Temp 97.1°F | Ht 69.0 in | Wt 273.0 lb

## 2018-06-19 DIAGNOSIS — R6889 Other general symptoms and signs: Secondary | ICD-10-CM | POA: Diagnosis not present

## 2018-06-19 DIAGNOSIS — R252 Cramp and spasm: Secondary | ICD-10-CM | POA: Diagnosis not present

## 2018-06-19 NOTE — Progress Notes (Signed)
Subjective: CC: Leg cramping PCP: Janora Norlander, DO ZOX:WRUE Kim Wilkerson is a 73 y.o. female presenting to clinic today for:  1.  Leg cramping/foot cramping Patient reports 1 month history of bilateral lower extremity cramping that seem to occur at nighttime.  She does report that the toes tend to curl under when this happens.  She reports fair hydration.  No changes in medications.  She uses Lasix every other day with good urine output.  Denies any increased lower extremity edema but reports that she has baseline lymphedema.  No change in physical activity.  No sensation changes.   ROS: Per HPI  No Known Allergies Past Medical History:  Diagnosis Date  . Arthritis   . Functional heart murmur 2019  . Hypertension 1980  . Leg ulcer ([place])   . Morbid obesity ([place])   . Varicose veins     Current Outpatient Medications:  .  aspirin 81 MG tablet, Take 81 mg by mouth daily., Disp: , Rfl:  .  atorvastatin (LIPITOR) 40 MG tablet, Take 1 tablet (40 mg total) by mouth daily., Disp: 90 tablet, Rfl: 3 .  diclofenac (VOLTAREN) 75 MG EC tablet, Take 1 tablet (75 mg total) by mouth 2 (two) times daily. (if needed for arthritis), Disp: 30 tablet, Rfl: 0 .  diltiazem (CARDIZEM CD) 240 MG 24 hr capsule, Take 1 capsule (240 mg total) by mouth daily., Disp: 90 capsule, Rfl: 3 .  furosemide (LASIX) 40 MG tablet, TAKE 1 TABLET(40 MG) BY MOUTH DAILY, Disp: 30 tablet, Rfl: 2 .  losartan (COZAAR) 50 MG tablet, TAKE 1 TABLET(50 MG) BY MOUTH DAILY, Disp: 90 tablet, Rfl: 1 .  pantoprazole (PROTONIX) 40 MG tablet, Take 1 tablet (40 mg total) by mouth daily., Disp: 30 tablet, Rfl: 3 .  ranitidine (ZANTAC) 150 MG tablet, TAKE 1 TABLET(150 MG) BY MOUTH TWICE DAILY AS NEEDED FOR HEARTBURN, Disp: 60 tablet, Rfl: 4 .  tiZANidine (ZANAFLEX) 4 MG tablet, Take 0.5-1 tablets (2-4 mg total) by mouth every 8 (eight) hours as needed for muscle spasms., Disp: 30 tablet, Rfl: 0 Social History   Socioeconomic History    . Marital status: Widowed    Spouse name: Not on file  . Number of children: 3  . Years of education: Not on file  . Highest education level: Not on file  Occupational History    Comment: Retired [employer]  . Financial resource strain: Not on file  . Food insecurity:    Worry: Not on file    Inability: Not on file  . Transportation needs:    Medical: Not on file    Non-medical: Not on file  Tobacco Use  . Smoking status: Former Smoker    Types: Cigarettes    Last attempt to quit: 01/02/1983    Years since quitting: 35.4  . Smokeless tobacco: Never Used  Substance and Sexual Activity  . Alcohol use: Yes    Alcohol/week: 0.0 standard drinks  . Drug use: No  . Sexual activity: Not on file  Lifestyle  . Physical activity:    Days per week: Not on file    Minutes per session: Not on file  . Stress: Not on file  Relationships  . Social connections:    Talks on phone: Not on file    Gets together: Not on file    Attends religious service: Not on file    Active member of club or organization: Not on file    Attends  meetings of clubs or organizations: Not on file    Relationship status: Not on file  . Intimate partner violence:    Fear of current or ex partner: Not on file    Emotionally abused: Not on file    Physically abused: Not on file    Forced sexual activity: Not on file  Other Topics Concern  . Not on file  Social History Narrative  . Not on file   Family History  Problem Relation Age of Onset  . Hypertension Mother   . Stroke Mother        after 33  . Hyperlipidemia Mother   . Thyroid disease Mother   . Hypertension Father   . Cirrhosis Father   . Alcohol abuse Father   . Heart attack Sister 21  . Hypertension Brother   . Ovarian cancer Neg Hx   . Breast cancer Neg Hx   . Prostate cancer Neg Hx   . Colon cancer Neg Hx     Objective: Office vital signs reviewed. BP 117/69   Pulse 71   Temp (!) 97.1 F (36.2 C) (Oral)   Ht _0   (1.753 m)   Wt 273 lb (123.8 kg)   BMI 40.32 kg/m   Physical Examination:  General: Awake, alert, morbidly obese, No acute distress Extremities: warm, well perfused, No pitting edema, cyanosis or clubbing; +2 pulses bilaterally Neuro: light touch sensation in tact  Assessment/ Plan: 73 y.o. female   1. Leg cramping Possibly related to vitamin deficiency versus anemia versus restless leg.  We will collect metabolic labs to further evaluate.  If labs are unremarkable, plan to proceed with treatment for restless leg with Requip.  We will contact the patient tomorrow with the results.  She is aware of the plan. - CMP14+EGFR - CBC - Vitamin B12 - Magnesium  2. Cramping of feet - CMP14+EGFR - CBC - Vitamin B12 - Magnesium   Orders Placed This Encounter  Procedures  . CMP14+EGFR  . CBC  . Vitamin B12  . Magnesium   No orders of the defined types were placed in this encounter.    Janora Norlander, DO [facility] [phone]

## 2018-06-19 NOTE — Patient Instructions (Signed)
We are checking for vitamin deficiency that might be causing your cramping sensation in the legs.  We discussed adequate hydration to reduce these symptoms.  If your labs come back normal, we will plan to start medicine for restless leg and reassess in 4 weeks.   Leg Cramps Leg cramps occur when one or more muscles tighten and you have no control over this tightening (involuntary muscle contraction). Muscle cramps can develop in any muscle, but the most common place is in the calf muscles of the leg. Those cramps can occur during exercise or when you are at rest. Leg cramps are painful, and they may last for a few seconds to a few minutes. Cramps may return several times before they finally stop. Usually, leg cramps are not caused by a serious medical problem. In many cases, the cause is not known. Some common causes include:  Excessive physical effort (overexertion), such as during intense exercise.  Overuse from repetitive motions, or doing the same thing over and over.  Staying in a certain position for a long period of time.  Improper preparation, form, or technique while performing a sport or an activity.  Dehydration.  Injury.  Side effects of certain medicines.  Abnormally low levels of minerals in your blood (electrolytes), especially potassium and calcium. This could result from: ? Pregnancy. ? Taking diuretic medicines. Follow these instructions at home: Eating and drinking  Drink enough fluid to keep your urine pale yellow. Staying hydrated may help prevent cramps.  Eat a healthy diet that includes plenty of nutrients to help your muscles function. A healthy diet includes fruits and vegetables, lean protein, whole grains, and low-fat or nonfat dairy products. Managing pain, stiffness, and swelling      Try massaging, stretching, and relaxing the affected muscle. Do this for several minutes at a time.  If directed, put ice on areas that are sore or painful after a  cramp: ? Put ice in a plastic bag. ? Place a towel between your skin and the bag. ? Leave the ice on for 20 minutes, 2-3 times a day.  If directed, apply heat to muscles that are tense or tight. Do this before you exercise, or as often as told by your health care provider. Use the heat source that your health care provider recommends, such as a moist heat pack or a heating pad. ? Place a towel between your skin and the heat source. ? Leave the heat on for 20-30 minutes. ? Remove the heat if your skin turns bright red. This is especially important if you are unable to feel pain, heat, or cold. You may have a greater risk of getting burned.  Try taking hot showers or baths to help relax tight muscles. General instructions  If you are having frequent leg cramps, avoid intense exercise for several days.  Take over-the-counter and prescription medicines only as told by your health care provider.  Keep all follow-up visits as told by your health care provider. This is important. Contact a health care provider if:  Your leg cramps get more severe or more frequent, or they do not improve over time.  Your foot becomes cold, numb, or blue. Summary  Muscle cramps can develop in any muscle, but the most common place is in the calf muscles of the leg.  Leg cramps are painful, and they may last for a few seconds to a few minutes.  Usually, leg cramps are not caused by a serious medical problem. Often, the cause  is not known.  Stay hydrated and take over-the-counter and prescription medicines only as told by your health care provider. This information is not intended to replace advice given to you by your health care provider. Make sure you discuss any questions you have with your health care provider. Document Released: 06/03/2004 Document Revised: 02/03/2017 Document Reviewed: 02/03/2017 Elsevier Interactive Patient Education  2019 [employer].

## 2018-06-20 ENCOUNTER — Telehealth: Payer: Self-pay | Admitting: Family Medicine

## 2018-06-20 ENCOUNTER — Other Ambulatory Visit: Payer: Self-pay | Admitting: Family Medicine

## 2018-06-20 LAB — CBC
HEMATOCRIT: 35.1 % (ref 34.0–46.6)
Hemoglobin: 11.2 g/dL (ref 11.1–15.9)
MCH: 25.5 pg — AB (ref 26.6–33.0)
MCHC: 31.9 g/dL (ref 31.5–35.7)
MCV: 80 fL (ref 79–97)
Platelets: 283 10*3/uL (ref 150–450)
RBC: 4.4 x10E6/uL (ref 3.77–5.28)
RDW: 14.2 % (ref 11.7–15.4)
WBC: 7.8 10*3/uL (ref 3.4–10.8)

## 2018-06-20 LAB — CMP14+EGFR
A/G RATIO: 1.4 (ref 1.2–2.2)
ALBUMIN: 3.9 g/dL (ref 3.8–4.8)
ALT: 5 IU/L (ref 0–32)
AST: 10 IU/L (ref 0–40)
Alkaline Phosphatase: 162 IU/L — ABNORMAL HIGH (ref 39–117)
BILIRUBIN TOTAL: 0.4 mg/dL (ref 0.0–1.2)
BUN / CREAT RATIO: 17 (ref 12–28)
BUN: 15 mg/dL (ref 8–27)
CHLORIDE: 103 mmol/L (ref 96–106)
CO2: 23 mmol/L (ref 20–29)
Calcium: 8.7 mg/dL (ref 8.7–10.3)
Creatinine, Ser: 0.87 mg/dL (ref 0.57–1.00)
GFR calc non Af Amer: 68 mL/min/{1.73_m2} (ref >59)
GFR, EST AFRICAN AMERICAN: 78 mL/min/{1.73_m2} (ref >59)
Globulin, Total: 2.7 g/dL (ref 1.5–4.5)
Glucose: 111 mg/dL — ABNORMAL HIGH (ref 65–99)
Potassium: 3.8 mmol/L (ref 3.5–5.2)
Sodium: 143 mmol/L (ref 134–144)
TOTAL PROTEIN: 6.6 g/dL (ref 6.0–8.5)

## 2018-06-20 LAB — VITAMIN B12: Vitamin B-12: 506 pg/mL (ref 232–1245)

## 2018-06-20 LAB — MAGNESIUM: MAGNESIUM: 2 mg/dL (ref 1.6–2.3)

## 2018-06-20 MED ORDER — ROPINIROLE HCL 0.25 MG PO TABS: ORAL_TABLET | ORAL | 0 refills | Status: DC

## 2018-06-20 NOTE — Telephone Encounter (Signed)
Patient requesting results on Dexa. Please review when available

## 2018-06-21 NOTE — Telephone Encounter (Signed)
This still hasn't resulted.  I will gladly call patient when results are available.

## 2018-06-27 ENCOUNTER — Other Ambulatory Visit: Payer: Self-pay | Admitting: Family Medicine

## 2018-06-27 DIAGNOSIS — K228 Other specified diseases of esophagus: Secondary | ICD-10-CM | POA: Diagnosis not present

## 2018-06-27 DIAGNOSIS — K449 Diaphragmatic hernia without obstruction or gangrene: Secondary | ICD-10-CM | POA: Diagnosis not present

## 2018-06-27 DIAGNOSIS — K222 Esophageal obstruction: Secondary | ICD-10-CM | POA: Diagnosis not present

## 2018-06-27 DIAGNOSIS — R1310 Dysphagia, unspecified: Secondary | ICD-10-CM | POA: Diagnosis not present

## 2018-06-27 DIAGNOSIS — K2970 Gastritis, unspecified, without bleeding: Secondary | ICD-10-CM | POA: Diagnosis not present

## 2018-06-27 DIAGNOSIS — R1013 Epigastric pain: Secondary | ICD-10-CM | POA: Diagnosis not present

## 2018-06-28 ENCOUNTER — Ambulatory Visit: Payer: Medicare PPO | Admitting: Gastroenterology

## 2018-07-11 ENCOUNTER — Other Ambulatory Visit: Payer: Self-pay | Admitting: Family Medicine

## 2018-07-26 DIAGNOSIS — H2513 Age-related nuclear cataract, bilateral: Secondary | ICD-10-CM | POA: Diagnosis not present

## 2018-07-31 ENCOUNTER — Other Ambulatory Visit: Payer: Self-pay | Admitting: Family Medicine

## 2018-07-31 ENCOUNTER — Telehealth: Payer: Self-pay | Admitting: Family Medicine

## 2018-07-31 NOTE — Telephone Encounter (Signed)
Tried to call regarding refill.  Want to see how she is doing on medication and if she is taking full dose of 0.5mg  daily.  Left VM. Awaiting call back from patient before refill sent.

## 2018-07-31 NOTE — Telephone Encounter (Signed)
Telephone note sent

## 2018-08-28 DIAGNOSIS — M19071 Primary osteoarthritis, right ankle and foot: Secondary | ICD-10-CM | POA: Diagnosis not present

## 2018-08-28 DIAGNOSIS — M19041 Primary osteoarthritis, right hand: Secondary | ICD-10-CM | POA: Diagnosis not present

## 2018-08-28 DIAGNOSIS — M19042 Primary osteoarthritis, left hand: Secondary | ICD-10-CM | POA: Diagnosis not present

## 2018-08-28 DIAGNOSIS — M150 Primary generalized (osteo)arthritis: Secondary | ICD-10-CM | POA: Diagnosis not present

## 2018-08-28 DIAGNOSIS — M2480 Other specific joint derangements of unspecified joint, not elsewhere classified: Secondary | ICD-10-CM | POA: Diagnosis not present

## 2018-08-28 DIAGNOSIS — M19072 Primary osteoarthritis, left ankle and foot: Secondary | ICD-10-CM | POA: Diagnosis not present

## 2018-08-28 DIAGNOSIS — M064 Inflammatory polyarthropathy: Secondary | ICD-10-CM | POA: Diagnosis not present

## 2018-09-11 ENCOUNTER — Telehealth: Payer: Self-pay | Admitting: Family Medicine

## 2018-10-13 ENCOUNTER — Ambulatory Visit: Payer: Medicare PPO | Admitting: Family Medicine

## 2018-10-13 ENCOUNTER — Other Ambulatory Visit: Payer: Self-pay

## 2018-11-12 ENCOUNTER — Other Ambulatory Visit: Payer: Self-pay | Admitting: Family Medicine

## 2018-11-20 DIAGNOSIS — M1712 Unilateral primary osteoarthritis, left knee: Secondary | ICD-10-CM | POA: Diagnosis not present

## 2018-12-08 ENCOUNTER — Telehealth: Payer: Self-pay | Admitting: Family Medicine

## 2018-12-08 NOTE — Telephone Encounter (Signed)
Left message patient will need to be seen in office.  Tramadol is not on med list

## 2018-12-13 ENCOUNTER — Ambulatory Visit: Payer: Medicare PPO | Admitting: Family Medicine

## 2019-01-01 ENCOUNTER — Other Ambulatory Visit: Payer: Self-pay | Admitting: Family Medicine

## 2019-01-03 ENCOUNTER — Other Ambulatory Visit: Payer: Self-pay | Admitting: Family Medicine

## 2019-02-08 ENCOUNTER — Other Ambulatory Visit: Payer: Self-pay | Admitting: Family Medicine

## 2019-03-09 ENCOUNTER — Ambulatory Visit (INDEPENDENT_AMBULATORY_CARE_PROVIDER_SITE_OTHER): Payer: Medicare PPO | Admitting: *Deleted

## 2019-03-09 DIAGNOSIS — Z0000 Encounter for general adult medical examination without abnormal findings: Secondary | ICD-10-CM

## 2019-03-09 NOTE — Progress Notes (Signed)
MEDICARE ANNUAL WELLNESS VISIT  03/09/2019  Telephone Visit Disclaimer This Medicare AWV was conducted by telephone due to national recommendations for restrictions regarding the COVID-19 Pandemic (e.g. social distancing).  I verified, using two identifiers, that I am speaking with Kim Wilkerson or their authorized healthcare agent. I discussed the limitations, risks, security, and privacy concerns of performing an evaluation and management service by telephone and the potential availability of an in-person appointment in the future. The patient expressed understanding and agreed to proceed.   Subjective:  Kim Wilkerson is a 73 y.o. female patient of Janora Norlander, DO who had a Medicare Annual Wellness Visit today via telephone. Kim Wilkerson is Retired and lives with their son. she has 3 children. she reports that she is socially active and does interact with friends/family regularly. she is minimally physically active and enjoys baking.  Patient Care Team: Janora Norlander, DO as PCP - General (Family Medicine) Danie Binder, MD as Consulting Physician (Gastroenterology)  Advanced Directives 03/09/2019 07/01/2017  Does Patient Have a Medical Advance Directive? No No  Would patient like information on creating a medical advance directive? No - Patient declined No - Patient declined    Hospital Utilization Over the Past 12 Months: # of hospitalizations or ER visits: 0 # of surgeries: 0  Review of Systems    Patient reports that her overall health is worse compared to last year.  History obtained from chart review  Patient Reported Readings (BP, Pulse, CBG, Weight, etc) none  Pain Assessment Pain : No/denies pain     Current Medications & Allergies (verified) Allergies as of 03/09/2019   No Known Allergies     Medication List       Accurate as of March 09, 2019  8:56 AM. If you have any questions, ask your nurse or doctor.        STOP taking these  medications   aspirin 81 MG tablet   rOPINIRole 0.5 MG tablet Commonly known as: REQUIP     TAKE these medications   acetaminophen 650 MG CR tablet Commonly known as: TYLENOL Take 650 mg by mouth every 8 (eight) hours as needed for pain.   atorvastatin 40 MG tablet Commonly known as: Lipitor Take 1 tablet (40 mg total) by mouth daily.   Cartia XT 240 MG 24 hr capsule Generic drug: diltiazem TAKE ONE CAPSULE BY MOUTH EVERY DAY   furosemide 40 MG tablet Commonly known as: LASIX TAKE 1 TABLET(40 MG) BY MOUTH DAILY   losartan 50 MG tablet Commonly known as: COZAAR TAKE 1 TABLET(50 MG) BY MOUTH DAILY (Needs to be seen before next refill)   nabumetone 750 MG tablet Commonly known as: RELAFEN Take 750 mg by mouth 2 (two) times daily.   pantoprazole 40 MG tablet Commonly known as: PROTONIX Take 1 tablet (40 mg total) by mouth daily.   tiZANidine 4 MG tablet Commonly known as: Zanaflex Take 0.5-1 tablets (2-4 mg total) by mouth every 8 (eight) hours as needed for muscle spasms.       History (reviewed): Past Medical History:  Diagnosis Date  . Arthritis   . Functional heart murmur 2019  . Hypertension 1980  . Leg ulcer ([place])   . Morbid obesity ([place])   . Varicose veins    Past Surgical History:  Procedure Laterality Date  . ABDOMINAL HYSTERECTOMY  1994  . COLONOSCOPY N/A 07/01/2017   Procedure: COLONOSCOPY;  Surgeon: Danie Binder, MD;  Location: [facility];  Service: Endoscopy;  Laterality: N/A;  1:00  . ESOPHAGEAL DILATION  11/2016   Dr Allena Katz in [place]   Family History  Problem Relation Age of Onset  . Hypertension Mother   . Stroke Mother        after 64  . Hyperlipidemia Mother   . Thyroid disease Mother   . Hypertension Father   . Cirrhosis Father   . Alcohol abuse Father   . Heart attack Sister 1  . Hypertension Brother   . Ovarian cancer Neg Hx   . Breast cancer Neg Hx   . Prostate cancer Neg Hx   . Colon cancer Neg Hx    Social  History   Socioeconomic History  . Marital status: Widowed    Spouse name: Not on file  . Number of children: 3  . Years of education: 34  . Highest education level: High school graduate  Occupational History  . Occupation: retired    Comment: Retired Landscape architect  . Financial resource strain: Not hard at all  . Food insecurity    Worry: Never true    Inability: Never true  . Transportation needs    Medical: No    Non-medical: No  Tobacco Use  . Smoking status: Former Smoker    Types: Cigarettes    Quit date: 05/11/1975    Years since quitting: 43.8  . Smokeless tobacco: Never Used  Substance and Sexual Activity  . Alcohol use: Yes    Alcohol/week: 0.0 standard drinks    Comment: 1 glass of wine every 2-3 weeks  . Drug use: No  . Sexual activity: Not Currently    Birth control/protection: Surgical  Lifestyle  . Physical activity    Days per week: 0 days    Minutes per session: 0 min  . Stress: Not at all  Relationships  . Social connections    Talks on phone: More than three times a week    Gets together: More than three times a week    Attends religious service: More than 4 times per year    Active member of club or organization: Yes    Attends meetings of clubs or organizations: More than 4 times per year    Relationship status: Widowed  Other Topics Concern  . Not on file  Social History Narrative  . Not on file    Activities of Daily Living In your present state of health, do you have any difficulty performing the following activities: 03/09/2019  Hearing? N  Vision? N  Comment wears glasses all the time-gets yearly eye exam  Difficulty concentrating or making decisions? N  Walking or climbing stairs? Y  Comment uses a cane all the time-knee pain makes walking difficult  Dressing or bathing? N  Doing errands, shopping? N  Preparing Food and eating ? N  Using the Toilet? N  In the past six months, have you accidently leaked urine? Y  Comment  wears pantyliners all the time  Do you have problems with loss of bowel control? N  Managing your Medications? N  Managing your Finances? N  Housekeeping or managing your Housekeeping? N  Some recent data might be hidden    Patient Education/ Literacy How often do you need to have someone help you when you read instructions, pamphlets, or other written materials from your doctor or pharmacy?: 1 - Never What is the last grade level you completed in school?: 12th grade  Exercise Current Exercise Habits: The  patient does not participate in regular exercise at present, Exercise limited by: orthopedic condition(s)  Diet Patient reports consuming 1 meals a day and 3 snack(s) a day Patient reports that her primary diet is: Regular Patient reports that she does have regular access to food.   Depression Screen PHQ 2/9 Scores 03/09/2019 06/19/2018 02/06/2018 09/27/2017 09/26/2017 08/05/2017 05/06/2017  PHQ - 2 Score 0 0 0 0 0 0 0  PHQ- 9 Score - 6 - - - - -     Fall Risk Fall Risk  03/09/2019 06/19/2018 02/06/2018 09/27/2017 09/26/2017  Falls in the past year? 1 1 No No Yes  Number falls in past yr: 1 1 - - 2 or more  Injury with Fall? 0 0 - - No  Risk for fall due to : Impaired balance/gait - - - Impaired balance/gait;Impaired mobility  Risk for fall due to: Comment uses a cane - - - -  Follow up Falls prevention discussed - - - Falls prevention discussed  Comment Get rid of all throw rugs in the house, adequate lighting in the walkways and grab bars in the bathroom - - - -     Objective:  Kim Wilkerson seemed alert and oriented and she participated appropriately during our telephone visit.  Blood Pressure Weight BMI  BP Readings from Last 3 Encounters:  06/19/18 117/69  05/17/18 140/87  02/06/18 138/79   Wt Readings from Last 3 Encounters:  06/19/18 273 lb (123.8 kg)  05/17/18 272 lb (123.4 kg)  02/06/18 276 lb (125.2 kg)   BMI Readings from Last 1 Encounters:  06/19/18 40.32 kg/m     *Unable to obtain current vital signs, weight, and BMI due to telephone visit type  Hearing/Vision  . Yissel did not seem to have difficulty with hearing/understanding during the telephone conversation . Reports that she has not had a formal eye exam by an eye care professional within the past year . Reports that she has not had a formal hearing evaluation within the past year *Unable to fully assess hearing and vision during telephone visit type  Cognitive Function: 6CIT Screen 03/09/2019  What Year? 0 points  What month? 0 points  What time? 0 points  Count back from 20 0 points  Months in reverse 0 points  Repeat phrase 0 points  Total Score 0   (Normal:0-7, Significant for Dysfunction: >8)  Normal Cognitive Function Screening: Yes   Immunization & Health Maintenance Record Immunization History  Administered Date(s) Administered  . Influenza, High Dose Seasonal PF 05/09/2018    Health Maintenance  Topic Date Due  . TETANUS/TDAP  11/28/1966  . MAMMOGRAM  11/27/1997  . DEXA SCAN  11/27/2012  . PNA vac Low Risk Adult (1 of 2 - PCV13) 11/27/2012  . COLONOSCOPY  07/02/2027  . Hepatitis C Screening  Completed  . INFLUENZA VACCINE  Discontinued       Assessment  This is a routine wellness examination for Kim Wilkerson.  Health Maintenance: Due or Overdue Health Maintenance Due  Topic Date Due  . TETANUS/TDAP  11/28/1966  . MAMMOGRAM  11/27/1997  . DEXA SCAN  11/27/2012  . PNA vac Low Risk Adult (1 of 2 - PCV13) 11/27/2012    Kim Wilkerson does not need a referral for Community Assistance: Care Management:   no Social Work:    no Prescription Assistance:  no Nutrition/Diabetes Education:  no   Plan:  Personalized Goals Goals Addressed  This Visit's Progress   . DIET - INCREASE WATER INTAKE       Try to drink 6-8 glasses of water daily      Personalized Health Maintenance & Screening Recommendations  Pneumococcal vaccine  Influenza  vaccine Td vaccine Screening mammography Bone densitometry screening Shingles vaccine  Lung Cancer Screening Recommended: no (Low Dose CT Chest recommended if Age 56-80 years, 30 pack-year currently smoking OR have quit w/in past 15 years) Hepatitis C Screening recommended: no HIV Screening recommended: no  Advanced Directives: Written information was not prepared per patient's request.  Referrals & Orders No orders of the defined types were placed in this encounter.   Follow-up Plan . Follow-up with Raliegh IpGottschalk, Ashly M, DO as planned . Schedule your Mammogram as discussed . Consider Flu, Prevnar13, TDAP and Shingles vaccines at your next visit with your PCP   I have personally reviewed and noted the following in the patient's chart:   . Medical and social history . Use of alcohol, tobacco or illicit drugs  . Current medications and supplements . Functional ability and status . Nutritional status . Physical activity . Advanced directives . List of other physicians . Hospitalizations, surgeries, and ER visits in previous 12 months . Vitals . Screenings to include cognitive, depression, and falls . Referrals and appointments  In addition, I have reviewed and discussed with Kim Wilkerson certain preventive protocols, quality metrics, and best practice recommendations. A written personalized care plan for preventive services as well as general preventive health recommendations is available and can be mailed to the patient at her request.      Hessie DienerSouthern, Tenlee Wollin G, LPN  [id number]/30/2020

## 2019-03-09 NOTE — Patient Instructions (Signed)
Preventive Care 71 Years and Older, Female Preventive care refers to lifestyle choices and visits with your health care provider that can promote health and wellness. This includes:  A yearly physical exam. This is also called an annual well check.  Regular dental and eye exams.  Immunizations.  Screening for certain conditions.  Healthy lifestyle choices, such as diet and exercise. What can I expect for my preventive care visit? Physical exam Your health care provider will check:  Height and weight. These may be used to calculate body mass index (BMI), which is a measurement that tells if you are at a healthy weight.  Heart rate and blood pressure.  Your skin for abnormal spots. Counseling Your health care provider may ask you questions about:  Alcohol, tobacco, and drug use.  Emotional well-being.  Home and relationship well-being.  Sexual activity.  Eating habits.  History of falls.  Memory and ability to understand (cognition).  Work and work Statistician.  Pregnancy and menstrual history. What immunizations do I need?  Influenza (flu) vaccine  This is recommended every year. Tetanus, diphtheria, and pertussis (Tdap) vaccine  You may need a Td booster every 10 years. Varicella (chickenpox) vaccine  You may need this vaccine if you have not already been vaccinated. Zoster (shingles) vaccine  You may need this after age 71. Pneumococcal conjugate (PCV13) vaccine  One dose is recommended after age 71. Pneumococcal polysaccharide (PPSV23) vaccine  One dose is recommended after age 72. Measles, mumps, and rubella (MMR) vaccine  You may need at least one dose of MMR if you were born in 1957 or later. You may also need a second dose. Meningococcal conjugate (MenACWY) vaccine  You may need this if you have certain conditions. Hepatitis A vaccine  You may need this if you have certain conditions or if you travel or work in places where you may be exposed  to hepatitis A. Hepatitis B vaccine  You may need this if you have certain conditions or if you travel or work in places where you may be exposed to hepatitis B. Haemophilus influenzae type b (Hib) vaccine  You may need this if you have certain conditions. You may receive vaccines as individual doses or as more than one vaccine together in one shot (combination vaccines). Talk with your health care provider about the risks and benefits of combination vaccines. What tests do I need? Blood tests  Lipid and cholesterol levels. These may be checked every 5 years, or more frequently depending on your overall health.  Hepatitis C test.  Hepatitis B test. Screening  Lung cancer screening. You may have this screening every year starting at age 71 if you have a 30-pack-year history of smoking and currently smoke or have quit within the past 15 years.  Colorectal cancer screening. All adults should have this screening starting at age 71 and continuing until age 15. Your health care provider may recommend screening at age 71 if you are at increased risk. You will have tests every 1-10 years, depending on your results and the type of screening test.  Diabetes screening. This is done by checking your blood sugar (glucose) after you have not eaten for a while (fasting). You may have this done every 1-3 years.  Mammogram. This may be done every 1-2 years. Talk with your health care provider about how often you should have regular mammograms.  BRCA-related cancer screening. This may be done if you have a family history of breast, ovarian, tubal, or peritoneal cancers.  Other tests  Sexually transmitted disease (STD) testing.  Bone density scan. This is done to screen for osteoporosis. You may have this done starting at age 71. Follow these instructions at home: Eating and drinking  Eat a diet that includes fresh fruits and vegetables, whole grains, lean protein, and low-fat dairy products. Limit  your intake of foods with high amounts of sugar, saturated fats, and salt.  Take vitamin and mineral supplements as recommended by your health care provider.  Do not drink alcohol if your health care provider tells you not to drink.  If you drink alcohol: ? Limit how much you have to 0-1 drink a day. ? Be aware of how much alcohol is in your drink. In the [place] one drink equals one 12 oz bottle of beer (355 mL), one 5 oz glass of wine (148 mL), or one 1 oz glass of hard liquor (44 mL). Lifestyle  Take daily care of your teeth and gums.  Stay active. Exercise for at least 30 minutes on 5 or more days each week.  Do not use any products that contain nicotine or tobacco, such as cigarettes, e-cigarettes, and chewing tobacco. If you need help quitting, ask your health care provider.  If you are sexually active, practice safe sex. Use a condom or other form of protection in order to prevent STIs (sexually transmitted infections).  Talk with your health care provider about taking a low-dose aspirin or statin. What's next?  Go to your health care provider once a year for a well check visit.  Ask your health care provider how often you should have your eyes and teeth checked.  Stay up to date on all vaccines. This information is not intended to replace advice given to you by your health care provider. Make sure you discuss any questions you have with your health care provider. Document Released: 05/23/2015 Document Revised: 04/20/2018 Document Reviewed: 04/20/2018 Elsevier Patient Education  2020 [employer].

## 2019-03-19 ENCOUNTER — Other Ambulatory Visit: Payer: Self-pay | Admitting: *Deleted

## 2019-03-19 ENCOUNTER — Other Ambulatory Visit: Payer: Self-pay | Admitting: Family Medicine

## 2019-03-19 MED ORDER — DILTIAZEM HCL ER COATED BEADS 240 MG PO CP24: 240.0000 mg | ORAL_CAPSULE | Freq: Every day | ORAL | 0 refills | Status: DC

## 2019-03-20 ENCOUNTER — Other Ambulatory Visit: Payer: Self-pay | Admitting: Family Medicine

## 2019-03-21 ENCOUNTER — Ambulatory Visit: Payer: Medicare PPO | Admitting: Family Medicine

## 2019-03-21 DIAGNOSIS — R1310 Dysphagia, unspecified: Secondary | ICD-10-CM | POA: Diagnosis not present

## 2019-03-21 DIAGNOSIS — R1013 Epigastric pain: Secondary | ICD-10-CM | POA: Diagnosis not present

## 2019-03-21 DIAGNOSIS — R142 Eructation: Secondary | ICD-10-CM | POA: Diagnosis not present

## 2019-03-21 DIAGNOSIS — Z1211 Encounter for screening for malignant neoplasm of colon: Secondary | ICD-10-CM | POA: Diagnosis not present

## 2019-03-21 DIAGNOSIS — K222 Esophageal obstruction: Secondary | ICD-10-CM | POA: Diagnosis not present

## 2019-03-21 DIAGNOSIS — K449 Diaphragmatic hernia without obstruction or gangrene: Secondary | ICD-10-CM | POA: Diagnosis not present

## 2019-04-02 DIAGNOSIS — R1013 Epigastric pain: Secondary | ICD-10-CM | POA: Diagnosis not present

## 2019-04-02 DIAGNOSIS — R1310 Dysphagia, unspecified: Secondary | ICD-10-CM | POA: Diagnosis not present

## 2019-04-03 ENCOUNTER — Other Ambulatory Visit: Payer: Self-pay | Admitting: *Deleted

## 2019-04-03 NOTE — Addendum Note (Signed)
Addended by: Antonietta Barcelona D on: 04/03/2019 08:28 AM   Modules accepted: Orders

## 2019-04-23 DIAGNOSIS — R1013 Epigastric pain: Secondary | ICD-10-CM | POA: Diagnosis not present

## 2019-04-23 DIAGNOSIS — K449 Diaphragmatic hernia without obstruction or gangrene: Secondary | ICD-10-CM | POA: Diagnosis not present

## 2019-04-23 DIAGNOSIS — R1310 Dysphagia, unspecified: Secondary | ICD-10-CM | POA: Diagnosis not present

## 2019-04-23 DIAGNOSIS — Z1211 Encounter for screening for malignant neoplasm of colon: Secondary | ICD-10-CM | POA: Diagnosis not present

## 2019-04-29 ENCOUNTER — Other Ambulatory Visit: Payer: Self-pay | Admitting: Family Medicine

## 2019-04-30 ENCOUNTER — Other Ambulatory Visit: Payer: Self-pay | Admitting: Family Medicine

## 2019-05-03 ENCOUNTER — Other Ambulatory Visit: Payer: Self-pay | Admitting: Family Medicine

## 2019-05-07 NOTE — Telephone Encounter (Signed)
Left message to call back office to schedule appointment.

## 2019-05-07 NOTE — Telephone Encounter (Signed)
Gottschalk. NTBS 30 days given 03/19/19 

## 2019-05-21 ENCOUNTER — Other Ambulatory Visit: Payer: Self-pay | Admitting: Family Medicine

## 2019-05-21 MED ORDER — LOSARTAN POTASSIUM 50 MG PO TABS: ORAL_TABLET | ORAL | 0 refills | Status: DC

## 2019-05-21 MED ORDER — DILTIAZEM HCL ER COATED BEADS 240 MG PO CP24: 240.0000 mg | ORAL_CAPSULE | Freq: Every day | ORAL | 0 refills | Status: DC

## 2019-05-21 NOTE — Telephone Encounter (Signed)
Pt aware refill sent to [facility] 

## 2019-05-21 NOTE — Telephone Encounter (Signed)
What is the name of the medication? Losartan 50 mg and Diltiazem 240 mg. Patient has appt with [facility] for med refills and needs some called in to carry her over til her appt. Patient changed insurance and needs to switch pharmacy  Have you contacted your pharmacy to request a refill? YES  Which pharmacy would you like this sent to? [facility] in [place]   Patient notified that their request is being sent to the clinical staff for review and that they should receive a call once it is complete. If they do not receive a call within 24 hours they can check with their pharmacy or our office.

## 2019-05-23 ENCOUNTER — Ambulatory Visit (INDEPENDENT_AMBULATORY_CARE_PROVIDER_SITE_OTHER): Payer: Medicare HMO | Admitting: Family Medicine

## 2019-05-23 ENCOUNTER — Encounter: Payer: Self-pay | Admitting: Family Medicine

## 2019-05-23 VITALS — Wt 272.0 lb

## 2019-05-23 DIAGNOSIS — M170 Bilateral primary osteoarthritis of knee: Secondary | ICD-10-CM | POA: Diagnosis not present

## 2019-05-23 DIAGNOSIS — K219 Gastro-esophageal reflux disease without esophagitis: Secondary | ICD-10-CM

## 2019-05-23 DIAGNOSIS — Z5329 Procedure and treatment not carried out because of patient's decision for other reasons: Secondary | ICD-10-CM

## 2019-05-23 DIAGNOSIS — I10 Essential (primary) hypertension: Secondary | ICD-10-CM | POA: Diagnosis not present

## 2019-05-23 DIAGNOSIS — E782 Mixed hyperlipidemia: Secondary | ICD-10-CM

## 2019-05-23 DIAGNOSIS — Z91199 Patient's noncompliance with other medical treatment and regimen due to unspecified reason: Secondary | ICD-10-CM

## 2019-05-23 MED ORDER — FAMOTIDINE 40 MG PO TABS: 40.0000 mg | ORAL_TABLET | Freq: Two times a day (BID) | ORAL | 2 refills | Status: DC

## 2019-05-23 MED ORDER — LOSARTAN POTASSIUM 50 MG PO TABS: ORAL_TABLET | ORAL | 3 refills | Status: DC

## 2019-05-23 MED ORDER — FUROSEMIDE 40 MG PO TABS: 40.0000 mg | ORAL_TABLET | Freq: Every day | ORAL | 3 refills | Status: DC

## 2019-05-23 MED ORDER — ATORVASTATIN CALCIUM 40 MG PO TABS: 40.0000 mg | ORAL_TABLET | Freq: Every day | ORAL | 3 refills | Status: DC

## 2019-05-23 MED ORDER — DILTIAZEM HCL ER COATED BEADS 240 MG PO CP24: 240.0000 mg | ORAL_CAPSULE | Freq: Every day | ORAL | 3 refills | Status: DC

## 2019-05-23 MED ORDER — LANSOPRAZOLE 30 MG PO CPDR: 30.0000 mg | DELAYED_RELEASE_CAPSULE | Freq: Every day | ORAL | 3 refills | Status: DC

## 2019-05-23 NOTE — Progress Notes (Signed)
Telephone visit  Attempted to reach at 10:11am (LVM); 10:20am (no answer)  Patient welcome to reschedule appointment. Plan for in office visit if possible.  She is due for labs, etc.

## 2019-05-23 NOTE — Progress Notes (Signed)
Telephone visit  Subjective: CC: medication refills PCP: Janora Norlander, DO Kim Wilkerson is a 74 y.o. female calls for telephone consult today. Patient provides verbal consent for consult held via phone.  Due to COVID-19 pandemic this visit was conducted virtually. This visit type was conducted due to national recommendations for restrictions regarding the COVID-19 Pandemic (e.g. social distancing, sheltering in place) in an effort to limit this patient's exposure and mitigate transmission in our community. All issues noted in this document were discussed and addressed.  A physical exam was not performed with this format.   Location of patient: home Location of provider: Working remotely from home Others present for call: none  1.  Hypertension with hyperlipidemia Patient reports compliance with diltiazem 240 mg, Lasix 40 mg and Cozaar 50 mg daily.  Denies any chest pain, shortness of breath, lower extremity edema or falls.  She is compliant with Lipitor 40 mg as well.  She has been currently fasting and working on lifestyle modification.  She is doing a juicing regimen and has successfully lost about 18 pounds.  2.  GERD Patient was noted to have severe GERD on her last examination by her gastroenterologist in [place].  There are plans for a gastric emptying study soon but she has not proceeded with this due to cost of the scan.  She went to the pharmacy to pick up the [place] but notes that it was over $300 and she could not afford this.  They unfortunately did not have her Pepcid available to her other.  She has omeprazole and that is what she has been using lately.  3.  Osteoarthritis of the knees Patient notes as needed use of Relafen.  She has not used it in a while but did not know that this could cause worsening GERD.  She will discontinue use immediately.  She does have Voltaren gel, which she typically uses for arthritic pain.  ROS: Per HPI  No Known Allergies Past  Medical History:  Diagnosis Date  . Arthritis   . Functional heart murmur 2019  . Hypertension 1980  . Leg ulcer ([place])   . Morbid obesity ([place])   . Varicose veins     Current Outpatient Medications:  .  diclofenac Sodium (VOLTAREN) 1 % GEL, Apply topically 4 (four) times daily., Disp: , Rfl:  .  acetaminophen (TYLENOL) 650 MG CR tablet, Take 650 mg by mouth every 8 (eight) hours as needed for pain., Disp: , Rfl:  .  atorvastatin (LIPITOR) 40 MG tablet, Take 1 tablet (40 mg total) by mouth daily., Disp: 90 tablet, Rfl: 3 .  diltiazem (CARTIA XT) 240 MG 24 hr capsule, Take 1 capsule (240 mg total) by mouth daily., Disp: 90 capsule, Rfl: 3 .  famotidine (PEPCID) 40 MG tablet, Take 1 tablet (40 mg total) by mouth 2 (two) times daily., Disp: 180 tablet, Rfl: 2 .  furosemide (LASIX) 40 MG tablet, Take 1 tablet (40 mg total) by mouth daily., Disp: 90 tablet, Rfl: 3 .  lansoprazole (PREVACID) 30 MG capsule, Take 1 capsule (30 mg total) by mouth daily at 12 noon., Disp: 90 capsule, Rfl: 3 .  losartan (COZAAR) 50 MG tablet, TAKE 1 TABLET(50 MG) BY MOUTH DAILY, Disp: 90 tablet, Rfl: 3  Assessment/ Plan: 74 y.o. female   1. Essential hypertension, benign Has not checked recently but reports relative control in past months.  She will come in for fasting labs and physical examination in the next couple of months. -  diltiazem (CARTIA XT) 240 MG 24 hr capsule; Take 1 capsule (240 mg total) by mouth daily.  Dispense: 90 capsule; Refill: 3 - furosemide (LASIX) 40 MG tablet; Take 1 tablet (40 mg total) by mouth daily.  Dispense: 90 tablet; Refill: 3 - losartan (COZAAR) 50 MG tablet; TAKE 1 TABLET(50 MG) BY MOUTH DAILY  Dispense: 90 tablet; Refill: 3  2. Mixed hyperlipidemia Renewal of Lipitor sent to [facility] per her request - atorvastatin (LIPITOR) 40 MG tablet; Take 1 tablet (40 mg total) by mouth daily.  Dispense: 90 tablet; Refill: 3  3. Primary osteoarthritis of knees, bilateral I have advised her  to avoid nabumetone given severe GERD found on recent EGD.  She will discontinue use and rely solely on topical Voltaren gel which seems to be working fairly well for her at this time  4. Gastroesophageal reflux disease, unspecified whether esophagitis present Stop omeprazole.  I have sent in Prevacid in lieu of Dexilant since this was not covered by her insurance.  I have also renewed her Pepcid and sent to the pharmacy as requested. - lansoprazole (PREVACID) 30 MG capsule; Take 1 capsule (30 mg total) by mouth daily at 12 noon.  Dispense: 90 capsule; Refill: 3 - famotidine (PEPCID) 40 MG tablet; Take 1 tablet (40 mg total) by mouth 2 (two) times daily.  Dispense: 180 tablet; Refill: 2   Start time: 4:05pm End time: 4:22pm  Total time spent on patient care (including telephone call/ virtual visit): 30 minutes  Kim Thayer Hulen Skains, DO [facility] [phone]

## 2019-07-12 DIAGNOSIS — R1310 Dysphagia, unspecified: Secondary | ICD-10-CM | POA: Diagnosis not present

## 2019-07-12 DIAGNOSIS — K449 Diaphragmatic hernia without obstruction or gangrene: Secondary | ICD-10-CM | POA: Diagnosis not present

## 2019-07-12 DIAGNOSIS — R1013 Epigastric pain: Secondary | ICD-10-CM | POA: Diagnosis not present

## 2019-08-28 ENCOUNTER — Ambulatory Visit (INDEPENDENT_AMBULATORY_CARE_PROVIDER_SITE_OTHER): Payer: Medicare PPO | Admitting: Family Medicine

## 2019-08-28 ENCOUNTER — Other Ambulatory Visit: Payer: Self-pay

## 2019-08-28 ENCOUNTER — Encounter: Payer: Self-pay | Admitting: Family Medicine

## 2019-08-28 VITALS — BP 132/78 | HR 65 | Temp 97.1°F | Ht 69.0 in | Wt 277.0 lb

## 2019-08-28 DIAGNOSIS — E782 Mixed hyperlipidemia: Secondary | ICD-10-CM

## 2019-08-28 DIAGNOSIS — G609 Hereditary and idiopathic neuropathy, unspecified: Secondary | ICD-10-CM | POA: Diagnosis not present

## 2019-08-28 DIAGNOSIS — I10 Essential (primary) hypertension: Secondary | ICD-10-CM | POA: Diagnosis not present

## 2019-08-28 DIAGNOSIS — K219 Gastro-esophageal reflux disease without esophagitis: Secondary | ICD-10-CM | POA: Diagnosis not present

## 2019-08-28 DIAGNOSIS — R739 Hyperglycemia, unspecified: Secondary | ICD-10-CM

## 2019-08-28 DIAGNOSIS — M170 Bilateral primary osteoarthritis of knee: Secondary | ICD-10-CM | POA: Diagnosis not present

## 2019-08-28 LAB — BAYER DCA HB A1C WAIVED: HB A1C (BAYER DCA - WAIVED): 5.4 % (ref <7.0)

## 2019-08-28 NOTE — Progress Notes (Signed)
Subjective: CC: HTN, HLD PCP: Janora Norlander, DO ZOX:WRUE Kim Wilkerson is a 74 y.o. female presenting to clinic today for:  1. HTN, HLD/ knee pain Patient here for fasting labs.  She reports compliance with Lipitor 40 mg daily, Cartia 240 milligrams daily, losartan 50 mg daily.  She also takes Lasix.  Denies any chest pain.  She does have some shortness of breath with exertion but notes that she is not exercising much because of her left knee pain.  She would like a referral to a doctor in [place] today for left knee pain.  She does not wish to return to [facility] as she does not feel that they have done sufficient treatment for her knee.  She uses topical Voltaren gel but again this does not provide much relief.  She reports instability of the left knee such that she is almost fallen on a couple of occasions.  She has some knee swelling in the left side.  2.  GERD Patient reports compliance with Prevacid.  She has not picked up the Pepcid.  She does report symptoms seem to be worse at nighttime such that she has to use a couple of pillows so that she does not have reflux into her throat.  Denies any nausea, vomiting, unplanned weight loss.  She was trying to lose weight by drinking a weight loss drink and had successfully got down to 266 pounds.  She has been gradually gaining weight back because she is stress eating.  She identifies her son has the cause of her stress.  ROS: Per HPI  No Known Allergies Past Medical History:  Diagnosis Date  . Arthritis   . Functional heart murmur 2019  . Hypertension 1980  . Leg ulcer ([place])   . Morbid obesity ([place])   . Varicose veins     Current Outpatient Medications:  .  acetaminophen (TYLENOL) 650 MG CR tablet, Take 650 mg by mouth every 8 (eight) hours as needed for pain., Disp: , Rfl:  .  atorvastatin (LIPITOR) 40 MG tablet, Take 1 tablet (40 mg total) by mouth daily., Disp: 90 tablet, Rfl: 3 .  diclofenac Sodium (VOLTAREN) 1 % GEL,  Apply topically 4 (four) times daily., Disp: , Rfl:  .  diltiazem (CARTIA XT) 240 MG 24 hr capsule, Take 1 capsule (240 mg total) by mouth daily., Disp: 90 capsule, Rfl: 3 .  famotidine (PEPCID) 40 MG tablet, Take 1 tablet (40 mg total) by mouth 2 (two) times daily., Disp: 180 tablet, Rfl: 2 .  furosemide (LASIX) 40 MG tablet, Take 1 tablet (40 mg total) by mouth daily., Disp: 90 tablet, Rfl: 3 .  lansoprazole (PREVACID) 30 MG capsule, Take 1 capsule (30 mg total) by mouth daily at 12 noon., Disp: 90 capsule, Rfl: 3 .  losartan (COZAAR) 50 MG tablet, TAKE 1 TABLET(50 MG) BY MOUTH DAILY, Disp: 90 tablet, Rfl: 3 Social History   Socioeconomic History  . Marital status: Widowed    Spouse name: Not on file  . Number of children: 3  . Years of education: 51  . Highest education level: High school graduate  Occupational History  . Occupation: retired    Comment: Retired Magazine features editor  Tobacco Use  . Smoking status: Former Smoker    Types: Cigarettes    Quit date: 05/11/1975    Years since quitting: 44.3  . Smokeless tobacco: Never Used  Substance and Sexual Activity  . Alcohol use: Yes    Alcohol/week: 0.0 standard drinks  Comment: 1 glass of wine every 2-3 weeks  . Drug use: No  . Sexual activity: Not Currently    Birth control/protection: Surgical  Other Topics Concern  . Not on file  Social History Narrative  . Not on file   Social Determinants of Health   Financial Resource Strain: Low Risk   . Difficulty of Paying Living Expenses: Not hard at all  Food Insecurity: No Food Insecurity  . Worried About Charity fundraiser in the Last Year: Never true  . Ran Out of Food in the Last Year: Never true  Transportation Needs: No Transportation Needs  . Lack of Transportation (Medical): No  . Lack of Transportation (Non-Medical): No  Physical Activity: Inactive  . Days of Exercise per Week: 0 days  . Minutes of Exercise per Session: 0 min  Stress: No Stress Concern Present  . Feeling  of Stress : Not at all  Social Connections: Slightly Isolated  . Frequency of Communication with Friends and Family: More than three times a week  . Frequency of Social Gatherings with Friends and Family: More than three times a week  . Attends Religious Services: More than 4 times per year  . Active Member of Clubs or Organizations: Yes  . Attends Archivist Meetings: More than 4 times per year  . Marital Status: Widowed  Intimate Partner Violence: Not At Risk  . Fear of Current or Ex-Partner: No  . Emotionally Abused: No  . Physically Abused: No  . Sexually Abused: No   Family History  Problem Relation Age of Onset  . Hypertension Mother   . Stroke Mother        after 57  . Hyperlipidemia Mother   . Thyroid disease Mother   . Hypertension Father   . Cirrhosis Father   . Alcohol abuse Father   . Heart attack Sister 44  . Hypertension Brother   . Ovarian cancer Neg Hx   . Breast cancer Neg Hx   . Prostate cancer Neg Hx   . Colon cancer Neg Hx     Objective: Office vital signs reviewed. BP 132/78   Pulse 65   Temp (!) 97.1 F (36.2 C) (Temporal)   Ht 5' 9"  (1.753 m)   Wt 277 lb (125.6 kg)   SpO2 96%   BMI 40.91 kg/m   Physical Examination:  General: Awake, alert, morbidly obese, No acute distress HEENT: Normal, sclera white, MMM Cardio: regular rate and rhythm, S1S2 heard, no murmurs appreciated Pulm: clear to auscultation bilaterally, no wheezes, rhonchi or rales; normal work of breathing on room air Extremities: warm, well perfused, No edema, cyanosis or clubbing; +2 pulses bilaterally MSK: antalgic gait and station; uses cane for ambulation  Left knee: Tender to palpation throughout.  No gross joint effusions.  No ligamentous laxity.  No palpable bony abnormalities. Skin: dry; intact; no rashes or lesions   Assessment/ Plan: 74 y.o. female   1. Essential hypertension, benign Controlled. - CMP14+EGFR  2. Mixed hyperlipidemia Check fasting  labs - CMP14+EGFR - Lipid Panel - TSH  3. Primary osteoarthritis of knees, bilateral Referral to ortho placed. - CBC - Ambulatory referral to Orthopedic Surgery  4. Gastroesophageal reflux disease, unspecified whether esophagitis present Not controlled.  Reinforced need to take the Pepcid along with the Prevacid.  Follow with GI if remains uncontrolled  5. Elevated serum glucose Check A1c - Bayer DCA Hb A1c Waived  6. Hereditary and idiopathic peripheral neuropathy Checking A1c as above.  Check vitamin B12. - Vitamin B12   Orders Placed This Encounter  Procedures  . CMP14+EGFR  . Lipid Panel  . TSH  . CBC  . Bayer DCA Hb A1c Waived  . Vitamin B12   No orders of the defined types were placed in this encounter.    Janora Norlander, DO [place] [phone]

## 2019-08-28 NOTE — Patient Instructions (Signed)
Make sure to take the Pepcid (famotidine) and the Prevacid for heart burn  You have refills on everything through 05/2020.  You had labs performed today.  You will be contacted with the results of the labs once they are available, usually in the next 3 business days for routine lab work.  If you have an active my chart account, they will be released to your MyChart.  If you prefer to have these labs released to you via telephone, please let us know.  If you had a pap smear or biopsy performed, expect to be contacted in about 7-10 days.

## 2019-08-29 LAB — LIPID PANEL
Chol/HDL Ratio: 2.4 ratio (ref 0.0–4.4)
Cholesterol, Total: 117 mg/dL (ref 100–199)
HDL: 48 mg/dL (ref >39)
LDL Chol Calc (NIH): 56 mg/dL (ref 0–99)
Triglycerides: 59 mg/dL (ref 0–149)
VLDL Cholesterol Cal: 13 mg/dL (ref 5–40)

## 2019-08-29 LAB — TSH: TSH: 1.94 u[IU]/mL (ref 0.450–4.500)

## 2019-08-29 LAB — CMP14+EGFR
ALT: 9 IU/L (ref 0–32)
AST: 15 IU/L (ref 0–40)
Albumin/Globulin Ratio: 1.4 (ref 1.2–2.2)
Albumin: 3.9 g/dL (ref 3.7–4.7)
Alkaline Phosphatase: 166 IU/L — ABNORMAL HIGH (ref 39–117)
BUN/Creatinine Ratio: 12 (ref 12–28)
BUN: 10 mg/dL (ref 8–27)
Bilirubin Total: 0.5 mg/dL (ref 0.0–1.2)
CO2: 24 mmol/L (ref 20–29)
Calcium: 9.1 mg/dL (ref 8.7–10.3)
Chloride: 105 mmol/L (ref 96–106)
Creatinine, Ser: 0.81 mg/dL (ref 0.57–1.00)
GFR calc Af Amer: 85 mL/min/{1.73_m2} (ref >59)
GFR calc non Af Amer: 73 mL/min/{1.73_m2} (ref >59)
Globulin, Total: 2.7 g/dL (ref 1.5–4.5)
Glucose: 99 mg/dL (ref 65–99)
Potassium: 4.4 mmol/L (ref 3.5–5.2)
Sodium: 144 mmol/L (ref 134–144)
Total Protein: 6.6 g/dL (ref 6.0–8.5)

## 2019-08-29 LAB — CBC
Hematocrit: 38 % (ref 34.0–46.6)
Hemoglobin: 12.1 g/dL (ref 11.1–15.9)
MCH: 25.9 pg — ABNORMAL LOW (ref 26.6–33.0)
MCHC: 31.8 g/dL (ref 31.5–35.7)
MCV: 81 fL (ref 79–97)
Platelets: 254 10*3/uL (ref 150–450)
RBC: 4.68 x10E6/uL (ref 3.77–5.28)
RDW: 14.6 % (ref 11.7–15.4)
WBC: 5.7 10*3/uL (ref 3.4–10.8)

## 2019-08-29 LAB — VITAMIN B12: Vitamin B-12: 513 pg/mL (ref 232–1245)

## 2019-09-05 ENCOUNTER — Telehealth: Payer: Self-pay | Admitting: Family Medicine

## 2019-09-05 DIAGNOSIS — K219 Gastro-esophageal reflux disease without esophagitis: Secondary | ICD-10-CM

## 2019-09-05 MED ORDER — FAMOTIDINE 40 MG PO TABS: 40.0000 mg | ORAL_TABLET | Freq: Two times a day (BID) | ORAL | 2 refills | Status: DC

## 2019-09-05 NOTE — Telephone Encounter (Signed)
Pepcid sent to corrected pharmacy [facility] per patient. Also COVID vaccine updated in chart.

## 2019-10-05 ENCOUNTER — Other Ambulatory Visit: Payer: Self-pay

## 2019-10-05 ENCOUNTER — Other Ambulatory Visit: Payer: Self-pay | Admitting: *Deleted

## 2019-10-05 DIAGNOSIS — K219 Gastro-esophageal reflux disease without esophagitis: Secondary | ICD-10-CM

## 2019-10-05 DIAGNOSIS — E782 Mixed hyperlipidemia: Secondary | ICD-10-CM

## 2019-10-05 DIAGNOSIS — I10 Essential (primary) hypertension: Secondary | ICD-10-CM

## 2019-10-05 MED ORDER — ATORVASTATIN CALCIUM 40 MG PO TABS: 40.0000 mg | ORAL_TABLET | Freq: Every day | ORAL | 0 refills | Status: DC

## 2019-10-05 MED ORDER — FUROSEMIDE 40 MG PO TABS: 40.0000 mg | ORAL_TABLET | Freq: Every day | ORAL | 0 refills | Status: DC

## 2019-10-05 MED ORDER — FAMOTIDINE 40 MG PO TABS: 40.0000 mg | ORAL_TABLET | Freq: Two times a day (BID) | ORAL | 2 refills | Status: DC

## 2019-10-15 DIAGNOSIS — M1712 Unilateral primary osteoarthritis, left knee: Secondary | ICD-10-CM | POA: Diagnosis not present

## 2019-10-23 ENCOUNTER — Other Ambulatory Visit: Payer: Self-pay | Admitting: *Deleted

## 2019-10-23 DIAGNOSIS — K219 Gastro-esophageal reflux disease without esophagitis: Secondary | ICD-10-CM

## 2019-10-23 MED ORDER — FAMOTIDINE 40 MG PO TABS: 40.0000 mg | ORAL_TABLET | Freq: Two times a day (BID) | ORAL | 1 refills | Status: DC

## 2019-10-29 ENCOUNTER — Other Ambulatory Visit: Payer: Self-pay | Admitting: *Deleted

## 2019-10-29 ENCOUNTER — Telehealth: Payer: Self-pay | Admitting: Family Medicine

## 2019-10-29 DIAGNOSIS — I10 Essential (primary) hypertension: Secondary | ICD-10-CM

## 2019-10-29 DIAGNOSIS — K219 Gastro-esophageal reflux disease without esophagitis: Secondary | ICD-10-CM

## 2019-10-29 MED ORDER — LANSOPRAZOLE 30 MG PO CPDR: 30.0000 mg | DELAYED_RELEASE_CAPSULE | Freq: Every day | ORAL | 1 refills | Status: DC

## 2019-10-29 MED ORDER — DILTIAZEM HCL ER COATED BEADS 240 MG PO CP24: 240.0000 mg | ORAL_CAPSULE | Freq: Every day | ORAL | 1 refills | Status: DC

## 2019-10-29 MED ORDER — LOSARTAN POTASSIUM 50 MG PO TABS: ORAL_TABLET | ORAL | 1 refills | Status: DC

## 2019-10-29 NOTE — Telephone Encounter (Signed)
  Prescription Request  10/29/2019  What is the name of the medication or equipment? Cartia XT, lansoprazole, and losartan  Have you contacted your pharmacy to request a refill? (if applicable) Yes  Which pharmacy would you like this sent to? [facility]  Pt says she switched pharmacies and needs these Rx's sent to [facility] to be filled.   Patient notified that their request is being sent to the clinical staff for review and that they should receive a response within 2 business days.

## 2019-10-29 NOTE — Telephone Encounter (Signed)
Aware. Scripts were sent to [facility] so she can get them free.

## 2019-11-08 ENCOUNTER — Other Ambulatory Visit: Payer: Self-pay | Admitting: Family Medicine

## 2019-11-08 DIAGNOSIS — Z1231 Encounter for screening mammogram for malignant neoplasm of breast: Secondary | ICD-10-CM

## 2019-11-16 ENCOUNTER — Telehealth: Payer: Self-pay | Admitting: Family Medicine

## 2019-11-16 DIAGNOSIS — M170 Bilateral primary osteoarthritis of knee: Secondary | ICD-10-CM

## 2019-11-16 NOTE — Telephone Encounter (Signed)
  REFERRAL REQUEST Telephone Note 11/16/2019  What type of referral do you need? Pain Management   Why do you need this referral? Chronic Issues  Have you been seen at our office for this problem? Yes (Advise that they may need an appointment with their PCP before a referral can be done)  Is there a particular doctor or location that you prefer? [facility] in [facility]   Patient notified that referrals can take up to a week or longer to process. If they haven't heard anything within a week they should call back and speak with the referral department.

## 2019-11-30 DIAGNOSIS — Z79891 Long term (current) use of opiate analgesic: Secondary | ICD-10-CM | POA: Diagnosis not present

## 2019-11-30 DIAGNOSIS — M25561 Pain in right knee: Secondary | ICD-10-CM | POA: Diagnosis not present

## 2019-11-30 DIAGNOSIS — M545 Low back pain: Secondary | ICD-10-CM | POA: Diagnosis not present

## 2019-11-30 DIAGNOSIS — G894 Chronic pain syndrome: Secondary | ICD-10-CM | POA: Diagnosis not present

## 2019-11-30 DIAGNOSIS — M62838 Other muscle spasm: Secondary | ICD-10-CM | POA: Diagnosis not present

## 2019-11-30 DIAGNOSIS — M542 Cervicalgia: Secondary | ICD-10-CM | POA: Diagnosis not present

## 2019-11-30 DIAGNOSIS — M25562 Pain in left knee: Secondary | ICD-10-CM | POA: Diagnosis not present

## 2019-12-05 ENCOUNTER — Other Ambulatory Visit: Payer: Self-pay

## 2019-12-05 ENCOUNTER — Ambulatory Visit
Admission: RE | Admit: 2019-12-05 | Discharge: 2019-12-05 | Disposition: A | Discharge status: Home / Self Care | Payer: Medicare PPO | Source: Ambulatory Visit | Attending: Family Medicine | Admitting: Family Medicine

## 2019-12-05 ENCOUNTER — Telehealth: Payer: Self-pay | Admitting: Family Medicine

## 2019-12-05 DIAGNOSIS — Z1231 Encounter for screening mammogram for malignant neoplasm of breast: Secondary | ICD-10-CM | POA: Diagnosis not present

## 2019-12-05 NOTE — Telephone Encounter (Signed)
Can we double check they actually do sleep studies before I put in referral.  Just don't want to have to go back and forth about it.

## 2019-12-05 NOTE — Telephone Encounter (Signed)
Do you know if there are any sleep specialists in [place], [place] this patient can be referred to?

## 2019-12-07 ENCOUNTER — Other Ambulatory Visit: Payer: Self-pay | Admitting: Family Medicine

## 2019-12-07 DIAGNOSIS — G4730 Sleep apnea, unspecified: Secondary | ICD-10-CM

## 2019-12-18 ENCOUNTER — Other Ambulatory Visit: Payer: Self-pay | Admitting: Family Medicine

## 2019-12-18 DIAGNOSIS — G4730 Sleep apnea, unspecified: Secondary | ICD-10-CM

## 2020-01-29 ENCOUNTER — Telehealth: Payer: Self-pay | Admitting: Family Medicine

## 2020-01-29 NOTE — Telephone Encounter (Signed)
BACK BRACE REQUEST SCAM  Kern Alberta called from [employer] [phone]) stating that they had spoken with patient about patient requesting an order be made by Dr Nadine Counts so that she can get a back brace.  There are no office notes made regarding patient wanting a back brace, so I called pt to confirm whether she is requesting a back brace or not and pt confirmed that she has NOT requested a back brace from anyone.

## 2020-02-29 ENCOUNTER — Ambulatory Visit: Payer: Medicare PPO | Admitting: Family Medicine

## 2020-03-10 DIAGNOSIS — G5603 Carpal tunnel syndrome, bilateral upper limbs: Secondary | ICD-10-CM | POA: Diagnosis not present

## 2020-03-10 DIAGNOSIS — G629 Polyneuropathy, unspecified: Secondary | ICD-10-CM | POA: Diagnosis not present

## 2020-03-10 DIAGNOSIS — G4700 Insomnia, unspecified: Secondary | ICD-10-CM | POA: Diagnosis not present

## 2020-03-10 DIAGNOSIS — G4733 Obstructive sleep apnea (adult) (pediatric): Secondary | ICD-10-CM | POA: Diagnosis not present

## 2020-03-13 ENCOUNTER — Other Ambulatory Visit: Payer: Self-pay | Admitting: Family Medicine

## 2020-03-13 DIAGNOSIS — I10 Essential (primary) hypertension: Secondary | ICD-10-CM

## 2020-03-14 ENCOUNTER — Encounter: Payer: Self-pay | Admitting: Family Medicine

## 2020-03-14 ENCOUNTER — Ambulatory Visit: Payer: Medicare PPO | Admitting: Family Medicine

## 2020-03-14 NOTE — Progress Notes (Deleted)
Subjective: CC: HTN, HLD PCP: Raliegh Ip, DO HUD:JSHF D Ober is a 74 y.o. female presenting to clinic today for:  1. HTN, HLD  Patient here for fasting labs.  She reports compliance with Lipitor 40 mg daily, Cartia 240 milligrams daily, losartan 50 mg daily, Lasix.  ***  ROS: Per HPI  No Known Allergies Past Medical History:  Diagnosis Date  . Arthritis   . Functional heart murmur 2019  . Hypertension 1980  . Leg ulcer ([facility])   . Morbid obesity ([facility])   . Varicose veins     Current Outpatient Medications:  .  acetaminophen (TYLENOL) 650 MG CR tablet, Take 650 mg by mouth every 8 (eight) hours as needed for pain., Disp: , Rfl:  .  atorvastatin (LIPITOR) 40 MG tablet, Take 1 tablet (40 mg total) by mouth daily., Disp: 90 tablet, Rfl: 0 .  diclofenac Sodium (VOLTAREN) 1 % GEL, Apply topically 4 (four) times daily., Disp: , Rfl:  .  diltiazem (CARTIA XT) 240 MG 24 hr capsule, Take 1 capsule (240 mg total) by mouth daily., Disp: 90 capsule, Rfl: 1 .  famotidine (PEPCID) 40 MG tablet, Take 1 tablet (40 mg total) by mouth 2 (two) times daily., Disp: 180 tablet, Rfl: 1 .  furosemide (LASIX) 40 MG tablet, TAKE 1 TABLET (40 MG TOTAL) BY MOUTH DAILY., Disp: 90 tablet, Rfl: 0 .  lansoprazole (PREVACID) 30 MG capsule, Take 1 capsule (30 mg total) by mouth daily at 12 noon., Disp: 90 capsule, Rfl: 1 .  losartan (COZAAR) 50 MG tablet, TAKE 1 TABLET(50 MG) BY MOUTH DAILY, Disp: 90 tablet, Rfl: 1 Social History   Socioeconomic History  . Marital status: Widowed    Spouse name: Not on file  . Number of children: 3  . Years of education: 55  . Highest education level: High school graduate  Occupational History  . 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 Smoking status: Former Smoker    Types: Cigarettes    Quit date: 05/11/1975    Years since quitting: 44.8  . Smokeless tobacco: Never Used  Vaping Use  . Vaping Use: Never used  Substance and Sexual  Activity  . Alcohol use: Yes    Alcohol/week: 0.0 standard drinks    Comment: 1 glass of wine every 2-3 weeks  . Drug use: No  . Sexual activity: Not Currently    Birth control/protection: Surgical  Other Topics Concern  . Not on file  Social History Narrative  . Not on file   Social Determinants of Health   Financial Resource Strain:   . Difficulty of Paying Living Expenses: Not on file  Food Insecurity:   . Worried About Programme researcher, broadcasting/film/video in the Last Year: Not on file  . Ran Out of Food in the Last Year: Not on file  Transportation Needs:   . Lack of Transportation (Medical): Not on file  . Lack of Transportation (Non-Medical): Not on file  Physical Activity:   . Days of Exercise per Week: Not on file  . Minutes of Exercise per Session: Not on file  Stress:   . Feeling of Stress : Not on file  Social Connections:   . Frequency of Communication with Friends and Family: Not on file  . Frequency of Social Gatherings with Friends and Family: Not on file  . Attends Religious Services: Not on file  . Active Member of Clubs or Organizations: Not on file  .  Attends Banker Meetings: Not on file  . Marital Status: Not on file  Intimate Partner Violence:   . Fear of Current or Ex-Partner: Not on file  . Emotionally Abused: Not on file  . Physically Abused: Not on file  . Sexually Abused: Not on file   Family History  Problem Relation Age of Onset  . Hypertension Mother   . Stroke Mother        after 67  . Hyperlipidemia Mother   . Thyroid disease Mother   . Hypertension Father   . Cirrhosis Father   . Alcohol abuse Father   . Heart attack Sister 67  . Hypertension Brother   . Ovarian cancer Neg Hx   . Breast cancer Neg Hx   . Prostate cancer Neg Hx   . Colon cancer Neg Hx     Objective: Office vital signs reviewed. There were no vitals taken for this visit.  Physical Examination:  General: Awake, alert, morbidly obese, No acute distress HEENT:  Normal, sclera white, MMM Cardio: regular rate and rhythm, S1S2 heard, no murmurs appreciated Pulm: clear to auscultation bilaterally, no wheezes, rhonchi or rales; normal work of breathing on room air Extremities: warm, well perfused, No edema, cyanosis or clubbing; +2 pulses bilaterally MSK: antalgic gait and station; uses cane for ambulation***   Assessment/ Plan: 74 y.o. female   ***  No orders of the defined types were placed in this encounter.  No orders of the defined types were placed in this encounter.    Raliegh Ip, DO [facility] [phone]

## 2020-03-27 DIAGNOSIS — G629 Polyneuropathy, unspecified: Secondary | ICD-10-CM | POA: Diagnosis not present

## 2020-03-27 DIAGNOSIS — G4700 Insomnia, unspecified: Secondary | ICD-10-CM | POA: Diagnosis not present

## 2020-03-27 DIAGNOSIS — M79603 Pain in arm, unspecified: Secondary | ICD-10-CM | POA: Diagnosis not present

## 2020-03-27 DIAGNOSIS — M479 Spondylosis, unspecified: Secondary | ICD-10-CM | POA: Diagnosis not present

## 2020-03-27 DIAGNOSIS — G4733 Obstructive sleep apnea (adult) (pediatric): Secondary | ICD-10-CM | POA: Diagnosis not present

## 2020-03-27 DIAGNOSIS — G5603 Carpal tunnel syndrome, bilateral upper limbs: Secondary | ICD-10-CM | POA: Diagnosis not present

## 2020-04-14 DIAGNOSIS — G4733 Obstructive sleep apnea (adult) (pediatric): Secondary | ICD-10-CM | POA: Diagnosis not present

## 2020-04-14 DIAGNOSIS — G4761 Periodic limb movement disorder: Secondary | ICD-10-CM | POA: Diagnosis not present

## 2020-04-14 DIAGNOSIS — G4730 Sleep apnea, unspecified: Secondary | ICD-10-CM | POA: Diagnosis not present

## 2020-04-14 DIAGNOSIS — G479 Sleep disorder, unspecified: Secondary | ICD-10-CM | POA: Diagnosis not present

## 2020-04-14 DIAGNOSIS — I495 Sick sinus syndrome: Secondary | ICD-10-CM | POA: Diagnosis not present

## 2020-05-30 ENCOUNTER — Telehealth: Payer: Self-pay | Admitting: Family Medicine

## 2020-05-30 DIAGNOSIS — N390 Urinary tract infection, site not specified: Secondary | ICD-10-CM | POA: Diagnosis not present

## 2020-05-30 NOTE — Telephone Encounter (Signed)
Pt went to urgent care and was told there was sugar in urine. Was calling because she would like to get labs done and get a flu shot but she lives in [place] and wanted to know if she could come in one day next week to do both.

## 2020-05-30 NOTE — Telephone Encounter (Signed)
Left message to call back  

## 2020-07-08 ENCOUNTER — Ambulatory Visit: Payer: Medicare PPO | Admitting: Family Medicine

## 2020-07-08 ENCOUNTER — Ambulatory Visit (INDEPENDENT_AMBULATORY_CARE_PROVIDER_SITE_OTHER): Payer: Medicare PPO

## 2020-07-08 ENCOUNTER — Other Ambulatory Visit: Payer: Self-pay

## 2020-07-08 ENCOUNTER — Ambulatory Visit (INDEPENDENT_AMBULATORY_CARE_PROVIDER_SITE_OTHER): Payer: Medicare PPO | Admitting: Family Medicine

## 2020-07-08 VITALS — BP 148/80 | HR 62 | Temp 97.2°F | Ht 69.0 in | Wt 281.0 lb

## 2020-07-08 DIAGNOSIS — M170 Bilateral primary osteoarthritis of knee: Secondary | ICD-10-CM

## 2020-07-08 DIAGNOSIS — E782 Mixed hyperlipidemia: Secondary | ICD-10-CM

## 2020-07-08 DIAGNOSIS — I10 Essential (primary) hypertension: Secondary | ICD-10-CM | POA: Diagnosis not present

## 2020-07-08 DIAGNOSIS — B3731 Acute candidiasis of vulva and vagina: Secondary | ICD-10-CM

## 2020-07-08 DIAGNOSIS — R81 Glycosuria: Secondary | ICD-10-CM | POA: Diagnosis not present

## 2020-07-08 DIAGNOSIS — B373 Candidiasis of vulva and vagina: Secondary | ICD-10-CM | POA: Diagnosis not present

## 2020-07-08 DIAGNOSIS — M8588 Other specified disorders of bone density and structure, other site: Secondary | ICD-10-CM | POA: Diagnosis not present

## 2020-07-08 DIAGNOSIS — Z23 Encounter for immunization: Secondary | ICD-10-CM

## 2020-07-08 DIAGNOSIS — Z780 Asymptomatic menopausal state: Secondary | ICD-10-CM | POA: Diagnosis not present

## 2020-07-08 DIAGNOSIS — K219 Gastro-esophageal reflux disease without esophagitis: Secondary | ICD-10-CM

## 2020-07-08 LAB — URINALYSIS
Bilirubin, UA: NEGATIVE
Glucose, UA: NEGATIVE
Ketones, UA: NEGATIVE
Leukocytes,UA: NEGATIVE
Nitrite, UA: NEGATIVE
Protein,UA: NEGATIVE
RBC, UA: NEGATIVE
Specific Gravity, UA: 1.015 (ref 1.005–1.030)
Urobilinogen, Ur: 0.2 mg/dL (ref 0.2–1.0)
pH, UA: 6.5 (ref 5.0–7.5)

## 2020-07-08 LAB — BAYER DCA HB A1C WAIVED: HB A1C (BAYER DCA - WAIVED): 5.5 % (ref <7.0)

## 2020-07-08 MED ORDER — LANSOPRAZOLE 30 MG PO CPDR: 30.0000 mg | DELAYED_RELEASE_CAPSULE | Freq: Every day | ORAL | 3 refills | Status: DC

## 2020-07-08 MED ORDER — FUROSEMIDE 40 MG PO TABS: 40.0000 mg | ORAL_TABLET | Freq: Every day | ORAL | 3 refills | Status: DC

## 2020-07-08 MED ORDER — DICLOFENAC SODIUM 1 % EX GEL: 4.0000 g | Freq: Four times a day (QID) | CUTANEOUS | 3 refills | Status: AC

## 2020-07-08 MED ORDER — LOSARTAN POTASSIUM 50 MG PO TABS: ORAL_TABLET | ORAL | 3 refills | Status: DC

## 2020-07-08 MED ORDER — FAMOTIDINE 40 MG PO TABS: 40.0000 mg | ORAL_TABLET | Freq: Two times a day (BID) | ORAL | 3 refills | Status: DC

## 2020-07-08 MED ORDER — FLUCONAZOLE 150 MG PO TABS: 150.0000 mg | ORAL_TABLET | Freq: Once | ORAL | 0 refills | Status: AC

## 2020-07-08 MED ORDER — DILTIAZEM HCL ER COATED BEADS 240 MG PO CP24: 240.0000 mg | ORAL_CAPSULE | Freq: Every day | ORAL | 3 refills | Status: DC

## 2020-07-08 NOTE — Patient Instructions (Signed)

## 2020-07-09 LAB — CMP14+EGFR
ALT: 7 IU/L (ref 0–32)
AST: 10 IU/L (ref 0–40)
Albumin/Globulin Ratio: 1.7 (ref 1.2–2.2)
Albumin: 4 g/dL (ref 3.7–4.7)
Alkaline Phosphatase: 170 IU/L — ABNORMAL HIGH (ref 44–121)
BUN/Creatinine Ratio: 11 — ABNORMAL LOW (ref 12–28)
BUN: 8 mg/dL (ref 8–27)
Bilirubin Total: 0.6 mg/dL (ref 0.0–1.2)
CO2: 24 mmol/L (ref 20–29)
Calcium: 8.6 mg/dL — ABNORMAL LOW (ref 8.7–10.3)
Chloride: 105 mmol/L (ref 96–106)
Creatinine, Ser: 0.74 mg/dL (ref 0.57–1.00)
Globulin, Total: 2.4 g/dL (ref 1.5–4.5)
Glucose: 102 mg/dL — ABNORMAL HIGH (ref 65–99)
Potassium: 4.6 mmol/L (ref 3.5–5.2)
Sodium: 142 mmol/L (ref 134–144)
Total Protein: 6.4 g/dL (ref 6.0–8.5)
eGFR: 86 mL/min/{1.73_m2} (ref >59)

## 2020-07-09 LAB — LIPID PANEL
Chol/HDL Ratio: 2.4 ratio (ref 0.0–4.4)
Cholesterol, Total: 114 mg/dL (ref 100–199)
HDL: 47 mg/dL (ref >39)
LDL Chol Calc (NIH): 54 mg/dL (ref 0–99)
Triglycerides: 58 mg/dL (ref 0–149)
VLDL Cholesterol Cal: 13 mg/dL (ref 5–40)

## 2020-07-11 NOTE — Progress Notes (Signed)
Subjective: CC: Follow-up hypertension, hyperlipidemia PCP: Janora Norlander, DO HUT:MLYY Kim Wilkerson is a 75 y.o. female presenting to clinic today for:  1.  Hypertension with hyperlipidemia Patient is compliant with her Cartia, Lasix, losartan and Lipitor No reports of chest pain, shortness of breath.  No falls.  She was told that she had sugar in her urine recently after she had a urinalysis for UTI.  She would like to get this rechecked  2.  Recent urinary tract infection Patient with recent urinary tract infection.  She is now experiencing vaginal itching and discharge and is concerned about a yeast vaginitis.  Asking for treatment for this  3.  Chronic bilateral knee pain Patient with known chronic osteoarthritis of the knees.  Asking for refills on her diclofenac gel.  She applies this as needed with moderate results   ROS: Per HPI  No Known Allergies Past Medical History:  Diagnosis Date  . Arthritis   . Functional heart murmur 2019  . Hypertension 1980  . Leg ulcer ([place])   . Morbid obesity ([place])   . Varicose veins     Current Outpatient Medications:  .  acetaminophen (TYLENOL) 650 MG CR tablet, Take 650 mg by mouth every 8 (eight) hours as needed for pain., Disp: , Rfl:  .  atorvastatin (LIPITOR) 40 MG tablet, Take 1 tablet (40 mg total) by mouth daily., Disp: 90 tablet, Rfl: 0 .  diclofenac Sodium (VOLTAREN) 1 % GEL, Apply 4 g topically 4 (four) times daily. As needed for joint pain, Disp: 400 g, Rfl: 3 .  diltiazem (CARTIA XT) 240 MG 24 hr capsule, Take 1 capsule (240 mg total) by mouth daily., Disp: 90 capsule, Rfl: 3 .  famotidine (PEPCID) 40 MG tablet, Take 1 tablet (40 mg total) by mouth 2 (two) times daily., Disp: 180 tablet, Rfl: 3 .  furosemide (LASIX) 40 MG tablet, Take 1 tablet (40 mg total) by mouth daily., Disp: 90 tablet, Rfl: 3 .  lansoprazole (PREVACID) 30 MG capsule, Take 1 capsule (30 mg total) by mouth daily at 12 noon., Disp: 90 capsule, Rfl: 3 .   losartan (COZAAR) 50 MG tablet, TAKE 1 TABLET(50 MG) BY MOUTH DAILY, Disp: 90 tablet, Rfl: 3 Social History   Socioeconomic History  . Marital status: Widowed    Spouse name: Not on file  . Number of children: 3  . Years of education: 7  . Highest education level: High school graduate  Occupational History  . Occupation: retired    Comment: Retired Magazine features editor  Tobacco Use  . Smoking status: Former Smoker    Types: Cigarettes    Quit date: 05/11/1975    Years since quitting: 45.2  . Smokeless tobacco: Never Used  Vaping Use  . Vaping Use: Never used  Substance and Sexual Activity  . Alcohol use: Yes    Alcohol/week: 0.0 standard drinks    Comment: 1 glass of wine every 2-3 weeks  . Drug use: No  . Sexual activity: Not Currently    Birth control/protection: Surgical  Other Topics Concern  . Not on file  Social History Narrative  . Not on file   Social Determinants of Health   Financial Resource Strain: Not on file  Food Insecurity: Not on file  Transportation Needs: Not on file  Physical Activity: Not on file  Stress: Not on file  Social Connections: Not on file  Intimate Partner Violence: Not on file   Family History  Problem Relation Age of Onset  .  Hypertension Mother   . Stroke Mother        after 74  . Hyperlipidemia Mother   . Thyroid disease Mother   . Hypertension Father   . Cirrhosis Father   . Alcohol abuse Father   . Heart attack Sister 26  . Hypertension Brother   . Ovarian cancer Neg Hx   . Breast cancer Neg Hx   . Prostate cancer Neg Hx   . Colon cancer Neg Hx     Objective: Office vital signs reviewed. BP (!) 148/80   Pulse 62   Temp (!) 97.2 F (36.2 C) (Temporal)   Ht 5' 9"  (1.753 m)   Wt 281 lb (127.5 kg)   SpO2 97%   BMI 41.50 kg/m   Physical Examination:  General: Awake, alert, morbidly obese, No acute distress HEENT: Normal; sclera white.  Moist mucous membranes Cardio: regular rate and rhythm, S1S2 heard, no murmurs  appreciated Pulm: clear to auscultation bilaterally, no wheezes, rhonchi or rales; normal work of breathing on room air Extremities: warm, well perfused, No edema, cyanosis or clubbing; +2 pulses bilaterally MSK: Antalgic gait  Assessment/ Plan: 75 y.o. female   Essential hypertension, benign - Plan: CMP14+EGFR, diltiazem (CARTIA XT) 240 MG 24 hr capsule, furosemide (LASIX) 40 MG tablet, losartan (COZAAR) 50 MG tablet  Mixed hyperlipidemia - Plan: CMP14+EGFR, Lipid Panel  Morbid obesity ([place]) - Plan: CMP14+EGFR  Glucosuria - Plan: Urinalysis, Bayer DCA Hb A1c Waived  Yeast vaginitis - Plan: fluconazole (DIFLUCAN) 150 MG tablet  Primary osteoarthritis of knees, bilateral - Plan: diclofenac Sodium (VOLTAREN) 1 % GEL, DG [facility] DEXA  Gastroesophageal reflux disease, unspecified whether esophagitis present - Plan: famotidine (PEPCID) 40 MG tablet, lansoprazole (PREVACID) 30 MG capsule  Post-menopausal - Plan: DG [facility] DEXA  Blood pressure is controlled for age Continue Lipitor, check fasting lipid panel, CMP Continue to reinforce lifestyle modification to improve weight and reduce risk factors for cardiovascular disease Repeat urinalysis showed no glucose in her urine.  A1c checked and did not demonstrate diabetes For her yeast vaginitis, Diflucan sent Voltaren gel applied to bilateral knees as needed pain GERD not discussed at length but seemingly stable.  Continue Pepcid, Prevacid Check DEXA scan given postmenopausal state  Pneumococcal vaccination administered today  Orders Placed This Encounter  Procedures  . DG [facility] DEXA    Standing Status:   Future    Number of Occurrences:   1    Standing Expiration Date:   07/08/2021    Order Specific Question:   Reason for Exam (SYMPTOM  OR DIAGNOSIS REQUIRED)    Answer:   post menopausal  . Pneumococcal conjugate vaccine 13-valent  . CMP14+EGFR  . Lipid Panel  . Urinalysis  . Bayer DCA Hb A1c Waived   Meds ordered this encounter   Medications  . fluconazole (DIFLUCAN) 150 MG tablet    Sig: Take 1 tablet (150 mg total) by mouth once for 1 dose.    Dispense:  1 tablet    Refill:  0  . diclofenac Sodium (VOLTAREN) 1 % GEL    Sig: Apply 4 g topically 4 (four) times daily. As needed for joint pain    Dispense:  400 g    Refill:  3  . diltiazem (CARTIA XT) 240 MG 24 hr capsule    Sig: Take 1 capsule (240 mg total) by mouth daily.    Dispense:  90 capsule    Refill:  3  . famotidine (PEPCID) 40 MG tablet  Sig: Take 1 tablet (40 mg total) by mouth 2 (two) times daily.    Dispense:  180 tablet    Refill:  3  . furosemide (LASIX) 40 MG tablet    Sig: Take 1 tablet (40 mg total) by mouth daily.    Dispense:  90 tablet    Refill:  3  . losartan (COZAAR) 50 MG tablet    Sig: TAKE 1 TABLET(50 MG) BY MOUTH DAILY    Dispense:  90 tablet    Refill:  3  . lansoprazole (PREVACID) 30 MG capsule    Sig: Take 1 capsule (30 mg total) by mouth daily at 12 noon.    Dispense:  90 capsule    Refill:  [place], [place] [phone]

## 2020-07-30 ENCOUNTER — Encounter: Payer: Self-pay | Admitting: *Deleted

## 2021-03-03 ENCOUNTER — Telehealth: Payer: Self-pay | Admitting: Family Medicine

## 2021-03-03 DIAGNOSIS — M25562 Pain in left knee: Secondary | ICD-10-CM | POA: Diagnosis not present

## 2021-03-03 DIAGNOSIS — M25561 Pain in right knee: Secondary | ICD-10-CM | POA: Diagnosis not present

## 2021-03-03 DIAGNOSIS — M170 Bilateral primary osteoarthritis of knee: Secondary | ICD-10-CM | POA: Diagnosis not present

## 2021-03-03 NOTE — Telephone Encounter (Signed)
LVM for pt to rtn my call to sche[place]le AWV with [facility]

## 2021-03-31 ENCOUNTER — Other Ambulatory Visit: Payer: Self-pay | Admitting: Family Medicine

## 2021-03-31 DIAGNOSIS — E782 Mixed hyperlipidemia: Secondary | ICD-10-CM

## 2021-04-15 ENCOUNTER — Telehealth: Payer: Self-pay | Admitting: Family Medicine

## 2021-04-15 NOTE — Telephone Encounter (Signed)
No answer unable to leave a message for patient to call back and schedule Medicare Annual Wellness Visit (AWV) to be completed by video or phone.   Last AWV: 03/09/2019  Please schedule at anytime with [facility] Nurse Health Advisor   Danna Sewell  45 minute appointment  Any questions, please contact me at [phone]

## 2021-05-08 ENCOUNTER — Ambulatory Visit (INDEPENDENT_AMBULATORY_CARE_PROVIDER_SITE_OTHER): Payer: Medicare PPO

## 2021-05-08 DIAGNOSIS — Z0000 Encounter for general adult medical examination without abnormal findings: Secondary | ICD-10-CM

## 2021-05-08 NOTE — Patient Instructions (Addendum)
°  MEDICARE ANNUAL WELLNESS VISIT Health Maintenance Summary and Written Plan of Care  Kim Wilkerson ,  Thank you for allowing me to perform your Medicare Annual Wellness Visit and for your ongoing commitment to your health.   Health Maintenance & Immunization History Health Maintenance  Topic Date Due   Zoster Vaccines- Shingrix (1 of 2) Never done   COVID-19 Vaccine (3 - Booster for Pfizer series) 10/29/2019   MAMMOGRAM  12/04/2020   INFLUENZA VACCINE  12/08/2020   Pneumonia Vaccine 34+ Years old (2 - PPSV23 if available, else PCV20) 07/08/2021   TETANUS/TDAP  07/08/2021 (Originally 11/28/1964)   DEXA SCAN  07/09/2022   COLONOSCOPY (Pts 45-81yrs Insurance coverage will need to be confirmed)  07/02/2027   Hepatitis C Screening  Completed   HPV VACCINES  Aged Out   Immunization History  Administered Date(s) Administered   Influenza, High Dose Seasonal PF 05/09/2018   Influenza,inj,Quad PF,6+ Mos 05/26/2020   PFIZER(Purple Top)SARS-COV-2 Vaccination 08/06/2019, 09/03/2019   Pneumococcal Conjugate-13 07/08/2020    These are the patient goals that we discussed:  Goals Addressed             This Visit's Progress    Exercise 150 min/wk Moderate Activity       Have 3 meals a day       Prevent falls           This is a list of Health Maintenance Items that are overdue or due now: Health Maintenance Due  Topic Date Due   Zoster Vaccines- Shingrix (1 of 2) Never done   COVID-19 Vaccine (3 - Booster for Pfizer series) 10/29/2019   MAMMOGRAM  12/04/2020   INFLUENZA VACCINE  12/08/2020   Pneumonia Vaccine 34+ Years old (2 - PPSV23 if available, else PCV20) 07/08/2021     Orders/Referrals Placed Today: No orders of the defined types were placed in this encounter.  (Contact our referral department at [phone] if you have not spoken with someone about your referral appointment within the next 5 days)    Follow-up Plan  05/26/2021 at 8:00am with Delynn Flavin,  DO

## 2021-05-08 NOTE — Progress Notes (Signed)
MEDICARE ANNUAL WELLNESS VISIT  05/08/2021  Telephone Visit Disclaimer This Medicare AWV was conducted by telephone due to national recommendations for restrictions regarding the COVID-19 Pandemic (e.g. social distancing).  I verified, using two identifiers, that I am speaking with Kim Wilkerson or their authorized healthcare agent. I discussed the limitations, risks, security, and privacy concerns of performing an evaluation and management service by telephone and the potential availability of an in-person appointment in the future. The patient expressed understanding and agreed to proceed.  Location of Patient: Home Location of Provider (nurse):  [facility]  Subjective:    Kim Wilkerson is a 75 y.o. female patient of Raliegh Ip, DO who had a Medicare Annual Wellness Visit today via telephone. Kim Wilkerson is Unemployed and lives alone. she has three children. she reports that she is socially active and does interact with friends/family regularly. she is not physically active.  Patient Care Team: Raliegh Ip, DO as PCP - General (Family Medicine) West Bali, MD (Inactive) as Consulting Physician (Gastroenterology)  Advanced Directives 05/08/2021 03/09/2019 07/01/2017  Does Patient Have a Medical Advance Directive? No No No  Would patient like information on creating a medical advance directive? No - Patient declined No - Patient declined No - Patient declined    Hospital Utilization Over the Past 12 Months: # of hospitalizations or ER visits: 0 # of surgeries: 0  Review of Systems    Patient reports that her overall health is unchanged compared to last year.  Negative except Complains of SOB on exerction. This is not new. Only when going up steps. She will discuss with [facility] at 1/17 visit. C/O bladder leakage. This is not new. Will again discuss at 1?17 visit.  Patient Reported Readings (BP, Pulse, CBG, Weight, etc) none  Pain  Assessment Pain : 0-10 Pain Score: 4  Pain Type: Chronic pain Pain Location: Knee Pain Orientation: Left, Right Pain Descriptors / Indicators: Aching Pain Onset: More than a month ago Pain Frequency: Intermittent     Current Medications & Allergies (verified) Allergies as of 05/08/2021   No Known Allergies      Medication List        Accurate as of May 08, 2021  8:42 AM. If you have any questions, ask your nurse or doctor.          acetaminophen 650 MG CR tablet Commonly known as: TYLENOL Take 650 mg by mouth every 8 (eight) hours as needed for pain.   atorvastatin 40 MG tablet Commonly known as: LIPITOR TAKE 1 TABLET (40 MG TOTAL) BY MOUTH DAILY.   diclofenac Sodium 1 % Gel Commonly known as: VOLTAREN Apply 4 g topically 4 (four) times daily. As needed for joint pain   diltiazem 240 MG 24 hr capsule Commonly known as: Cartia XT Take 1 capsule (240 mg total) by mouth daily.   famotidine 40 MG tablet Commonly known as: PEPCID Take 1 tablet (40 mg total) by mouth 2 (two) times daily.   furosemide 40 MG tablet Commonly known as: LASIX Take 1 tablet (40 mg total) by mouth daily.   lansoprazole 30 MG capsule Commonly known as: Prevacid Take 1 capsule (30 mg total) by mouth daily at 12 noon.   losartan 50 MG tablet Commonly known as: COZAAR TAKE 1 TABLET(50 MG) BY MOUTH DAILY        History (reviewed): Past Medical History:  Diagnosis Date   Arthritis    Functional heart murmur 2019  Hypertension 1980   Leg ulcer (HCC)    Morbid obesity (HCC)    Varicose veins    Past Surgical History:  Procedure Laterality Date   ABDOMINAL HYSTERECTOMY  1994   COLONOSCOPY N/A 07/01/2017   Procedure: COLONOSCOPY;  Surgeon: West Bali, MD;  Location: [facility];  Service: Endoscopy;  Laterality: N/A;  1:00   ESOPHAGEAL DILATION  11/2016   Dr Allena Katz in [place]   Family History  Problem Relation Age of Onset   Hypertension Mother    Stroke  Mother        after 53   Hyperlipidemia Mother    Thyroid disease Mother    Hypertension Father    Cirrhosis Father    Alcohol abuse Father    Heart attack Sister 36   Hypertension Brother    Ovarian cancer Neg Hx    Breast cancer Neg Hx    Prostate cancer Neg Hx    Colon cancer Neg Hx    Social History   Socioeconomic History   Marital status: Widowed    Spouse name: Not on file   Number of children: 3   Years of education: 12   Highest education level: High school graduate  Occupational History   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  Tobacco Use   Smoking status: Former    Types: Cigarettes    Quit date: 05/11/1975    Years since quitting: 46.0   Smokeless tobacco: Never  Vaping Use   Vaping Use: Never used  Substance and Sexual Activity   Alcohol use: Yes    Alcohol/week: 0.0 standard drinks    Comment: 1 glass of wine every 2-3 weeks   Drug use: No   Sexual activity: Not Currently    Birth control/protection: Surgical  Other Topics Concern   Not on file  Social History Narrative   Not on file   Social Determinants of Health   Financial Resource Strain: Not on file  Food Insecurity: Not on file  Transportation Needs: Not on file  Physical Activity: Not on file  Stress: Not on file  Social Connections: Not on file    Activities of Daily Living In your present state of health, do you have any difficulty performing the following activities: 05/08/2021  Hearing? N  Vision? N  Difficulty concentrating or making decisions? N  Walking or climbing stairs? Y  Dressing or bathing? N  Doing errands, shopping? N  Preparing Food and eating ? N  Using the Toilet? N  In the past six months, have you accidently leaked urine? Y  Do you have problems with loss of bowel control? N  Managing your Medications? N  Managing your Finances? N  Housekeeping or managing your Housekeeping? N  Some recent data might be hidden    Patient Education/ Literacy How  often do you need to have someone help you when you read instructions, pamphlets, or other written materials from your doctor or pharmacy?: 2 - Rarely  Exercise Current Exercise Habits: The patient does not participate in regular exercise at present, Exercise limited by: cardiac condition(s);orthopedic condition(s);respiratory conditions(s)  Diet Patient reports consuming 1 meals a day and 2 snack(s) a day Patient reports that her primary diet is: Regular Patient reports that she does have regular access to food.   Depression Screen PHQ 2/9 Scores 07/08/2020 08/28/2019 03/09/2019 06/19/2018 02/06/2018 09/27/2017 09/26/2017  PHQ - 2 Score 0 0 0 0 0 0 0  PHQ- 9 Score 0 0 -  6 - - -     Fall Risk Fall Risk  05/08/2021 07/08/2020 08/28/2019 03/09/2019 06/19/2018  Falls in the past year? 1 1 0 1 1  Number falls in past yr: 1 0 - 1 1  Injury with Fall? 0 0 - 0 0  Risk for fall due to : History of fall(s);Impaired balance/gait History of fall(s) - Impaired balance/gait -  Risk for fall due to: Comment - - - uses a cane -  Follow up Falls evaluation completed Falls prevention discussed - Falls prevention discussed -  Comment - - - Get rid of all throw rugs in the house, adequate lighting in the walkways and grab bars in the bathroom -     Objective:  Kim Wilkerson seemed alert and oriented and she participated appropriately during our telephone visit.  Blood Pressure Weight BMI  BP Readings from Last 3 Encounters:  07/08/20 (!) 148/80  08/28/19 132/78  06/19/18 117/69   Wt Readings from Last 3 Encounters:  07/08/20 281 lb (127.5 kg)  08/28/19 277 lb (125.6 kg)  05/23/19 272 lb (123.4 kg)   BMI Readings from Last 1 Encounters:  07/08/20 41.50 kg/m    *Unable to obtain current vital signs, weight, and BMI due to telephone visit type  Hearing/Vision  Kim Wilkerson did not seem to have difficulty with hearing/understanding during the telephone conversation Reports that she has not had a formal eye  exam by an eye care professional within the past year Reports that she has not had a formal hearing evaluation within the past year *Unable to fully assess hearing and vision during telephone visit type  Cognitive Function: 6CIT Screen 05/08/2021 03/09/2019  What Year? 0 points 0 points  What month? 0 points 0 points  What time? 0 points 0 points  Count back from 20 0 points 0 points  Months in reverse 0 points 0 points  Repeat phrase 2 points 0 points  Total Score 2 0   (Normal:0-7, Significant for Dysfunction: >8)  Normal Cognitive Function Screening: Yes   Immunization & Health Maintenance Record Immunization History  Administered Date(s) Administered   Influenza, High Dose Seasonal PF 05/09/2018   Influenza,inj,Quad PF,6+ Mos 05/26/2020   PFIZER(Purple Top)SARS-COV-2 Vaccination 08/06/2019, 09/03/2019   Pneumococcal Conjugate-13 07/08/2020    Health Maintenance  Topic Date Due   Zoster Vaccines- Shingrix (1 of 2) Never done   COVID-19 Vaccine (3 - Booster for Pfizer series) 10/29/2019   MAMMOGRAM  12/04/2020   INFLUENZA VACCINE  12/08/2020   Pneumonia Vaccine 58+ Years old (2 - PPSV23 if available, else PCV20) 07/08/2021   TETANUS/TDAP  07/08/2021 (Originally 11/28/1964)   DEXA SCAN  07/09/2022   COLONOSCOPY (Pts 45-26yrs Insurance coverage will need to be confirmed)  07/02/2027   Hepatitis C Screening  Completed   HPV VACCINES  Aged Out       Assessment  This is a routine wellness examination for Kim Wilkerson.  Health Maintenance: Due or Overdue Health Maintenance Due  Topic Date Due   Zoster Vaccines- Shingrix (1 of 2) Never done   COVID-19 Vaccine (3 - Booster for Pfizer series) 10/29/2019   MAMMOGRAM  12/04/2020   INFLUENZA VACCINE  12/08/2020   Pneumonia Vaccine 51+ Years old (2 - PPSV23 if available, else PCV20) 07/08/2021    Kim Wilkerson does not need a referral for Community Assistance: Care Management:   no Social Work:    no Prescription  Assistance:  no Nutrition/Diabetes Education:  no  Plan:  Personalized Goals  Goals Addressed             This Visit's Progress    Exercise 150 min/wk Moderate Activity       Have 3 meals a day       Prevent falls         Personalized Health Maintenance & Screening Recommendations  Pneumococcal vaccine  Influenza vaccine Advanced directives: has NO advanced directive - not interested in additional information Shingles vaccine  Lung Cancer Screening Recommended: no (Low Dose CT Chest recommended if Age 71-80 years, 30 pack-year currently smoking OR have quit w/in past 15 years) Hepatitis C Screening recommended: no HIV Screening recommended: no  Advanced Directives: Written information was not prepared per patient's request.  Referrals & Orders No orders of the defined types were placed in this encounter.   Follow-up Plan Follow-up with Raliegh Ip, DO as planned Schedule 05/26/2021    I have personally reviewed and noted the following in the patients chart:   Medical and social history Use of alcohol, tobacco or illicit drugs  Current medications and supplements Functional ability and status Nutritional status Physical activity Advanced directives List of other physicians Hospitalizations, surgeries, and ER visits in previous 12 months Vitals Screenings to include cognitive, depression, and falls Referrals and appointments  In addition, I have reviewed and discussed with Kim Wilkerson certain preventive protocols, quality metrics, and best practice recommendations. A written personalized care plan for preventive services as well as general preventive health recommendations is available and can be mailed to the patient at her request.      Dorene Sorrow  05/08/2021

## 2021-05-13 DIAGNOSIS — E669 Obesity, unspecified: Secondary | ICD-10-CM | POA: Diagnosis not present

## 2021-05-13 DIAGNOSIS — G5603 Carpal tunnel syndrome, bilateral upper limbs: Secondary | ICD-10-CM | POA: Diagnosis not present

## 2021-05-13 DIAGNOSIS — G4733 Obstructive sleep apnea (adult) (pediatric): Secondary | ICD-10-CM | POA: Diagnosis not present

## 2021-05-13 DIAGNOSIS — G4700 Insomnia, unspecified: Secondary | ICD-10-CM | POA: Diagnosis not present

## 2021-05-15 DIAGNOSIS — M25562 Pain in left knee: Secondary | ICD-10-CM | POA: Diagnosis not present

## 2021-05-15 DIAGNOSIS — M2352 Chronic instability of knee, left knee: Secondary | ICD-10-CM | POA: Diagnosis not present

## 2021-05-15 DIAGNOSIS — G8929 Other chronic pain: Secondary | ICD-10-CM | POA: Diagnosis not present

## 2021-05-19 ENCOUNTER — Ambulatory Visit: Payer: Medicare PPO | Admitting: Family Medicine

## 2021-05-21 DIAGNOSIS — I8392 Asymptomatic varicose veins of left lower extremity: Secondary | ICD-10-CM | POA: Diagnosis not present

## 2021-05-21 DIAGNOSIS — M7122 Synovial cyst of popliteal space [Baker], left knee: Secondary | ICD-10-CM | POA: Diagnosis not present

## 2021-05-21 DIAGNOSIS — S72422A Displaced fracture of lateral condyle of left femur, initial encounter for closed fracture: Secondary | ICD-10-CM | POA: Diagnosis not present

## 2021-05-21 DIAGNOSIS — M25462 Effusion, left knee: Secondary | ICD-10-CM | POA: Diagnosis not present

## 2021-05-21 DIAGNOSIS — S83272A Complex tear of lateral meniscus, current injury, left knee, initial encounter: Secondary | ICD-10-CM | POA: Diagnosis not present

## 2021-05-21 DIAGNOSIS — G8929 Other chronic pain: Secondary | ICD-10-CM | POA: Diagnosis not present

## 2021-05-21 DIAGNOSIS — M2352 Chronic instability of knee, left knee: Secondary | ICD-10-CM | POA: Diagnosis not present

## 2021-05-21 DIAGNOSIS — M23342 Other meniscus derangements, anterior horn of lateral meniscus, left knee: Secondary | ICD-10-CM | POA: Diagnosis not present

## 2021-05-21 DIAGNOSIS — M1712 Unilateral primary osteoarthritis, left knee: Secondary | ICD-10-CM | POA: Diagnosis not present

## 2021-05-21 DIAGNOSIS — M898X5 Other specified disorders of bone, thigh: Secondary | ICD-10-CM | POA: Diagnosis not present

## 2021-05-26 ENCOUNTER — Ambulatory Visit: Payer: Medicare PPO | Admitting: Family Medicine

## 2021-05-26 DIAGNOSIS — M2352 Chronic instability of knee, left knee: Secondary | ICD-10-CM | POA: Diagnosis not present

## 2021-05-26 DIAGNOSIS — M25562 Pain in left knee: Secondary | ICD-10-CM | POA: Diagnosis not present

## 2021-05-26 DIAGNOSIS — G8929 Other chronic pain: Secondary | ICD-10-CM | POA: Diagnosis not present

## 2021-05-26 DIAGNOSIS — S72415A Nondisplaced unspecified condyle fracture of lower end of left femur, initial encounter for closed fracture: Secondary | ICD-10-CM | POA: Diagnosis not present

## 2021-05-26 DIAGNOSIS — M1712 Unilateral primary osteoarthritis, left knee: Secondary | ICD-10-CM | POA: Diagnosis not present

## 2021-06-15 DIAGNOSIS — G4733 Obstructive sleep apnea (adult) (pediatric): Secondary | ICD-10-CM | POA: Diagnosis not present

## 2021-06-23 DIAGNOSIS — G4733 Obstructive sleep apnea (adult) (pediatric): Secondary | ICD-10-CM | POA: Diagnosis not present

## 2021-06-24 ENCOUNTER — Ambulatory Visit (INDEPENDENT_AMBULATORY_CARE_PROVIDER_SITE_OTHER): Payer: Medicare PPO | Admitting: Family Medicine

## 2021-06-24 ENCOUNTER — Encounter: Payer: Self-pay | Admitting: Family Medicine

## 2021-06-24 VITALS — BP 159/86 | HR 66 | Temp 97.6°F | Ht 69.0 in | Wt 294.6 lb

## 2021-06-24 DIAGNOSIS — G8929 Other chronic pain: Secondary | ICD-10-CM | POA: Diagnosis not present

## 2021-06-24 DIAGNOSIS — E782 Mixed hyperlipidemia: Secondary | ICD-10-CM | POA: Diagnosis not present

## 2021-06-24 DIAGNOSIS — M25562 Pain in left knee: Secondary | ICD-10-CM | POA: Diagnosis not present

## 2021-06-24 DIAGNOSIS — I10 Essential (primary) hypertension: Secondary | ICD-10-CM

## 2021-06-24 DIAGNOSIS — R010 Benign and innocent cardiac murmurs: Secondary | ICD-10-CM

## 2021-06-24 DIAGNOSIS — R0609 Other forms of dyspnea: Secondary | ICD-10-CM

## 2021-06-24 MED ORDER — TRAMADOL HCL 50 MG PO TABS: 50.0000 mg | ORAL_TABLET | Freq: Three times a day (TID) | ORAL | 0 refills | Status: DC | PRN

## 2021-06-24 MED ORDER — ALBUTEROL SULFATE HFA 108 (90 BASE) MCG/ACT IN AERS: 2.0000 | INHALATION_SPRAY | Freq: Four times a day (QID) | RESPIRATORY_TRACT | 0 refills | Status: DC | PRN

## 2021-06-24 NOTE — Patient Instructions (Signed)
Come back in 2 weeks for Blood pressure check with nurse and to do your FASTING blood work. Short supply of Tramadol sent to pharmacy while you wait to see your specialist.  They can continue filling it if needed Albuterol inhaler sent for breathing.  Use before activities that cause shortness of breath IF needed  Albuterol Metered Dose Inhaler (MDI) What is this medication? ALBUTEROL (al Gaspar Bidding) treats lung diseases, such as asthma, where the airways in the lungs narrow, causing breathing problems or wheezing (bronchospasm). It is also used to treat asthma or prevent breathing problems during exercise. This medication works by opening the airways of the lungs, making it easier to breathe. It is often called a rescue- or quick-relief inhaler. This medicine may be used for other purposes; ask your health care provider or pharmacist if you have questions. COMMON BRAND NAME(S): Proair HFA, Proventil, Proventil HFA, Respirol, Ventolin, Ventolin HFA What should I tell my care team before I take this medication? They need to know if you have any of these conditions: Diabetes (high blood sugar) Heart disease High blood pressure Irregular heartbeat or rhythm Pheochromocytoma Seizures Thyroid disease An unusual or allergic reaction to albuterol, other medications, foods, dyes, or preservatives Pregnant or trying to get pregnant Breast-feeding How should I use this medication? This medication is inhaled through the mouth. Take it as directed on the prescription label. Do not use it more often than directed. This medication comes with INSTRUCTIONS FOR USE. Ask your pharmacist for directions on how to use this medication. Read the information carefully. Talk to your pharmacist or care team if you have questions. Talk to your care team about the use of this medication in children. While it may be given to children for selected conditions, precautions do apply. Overdosage: If you think you have  taken too much of this medicine contact a poison control center or emergency room at once. NOTE: This medicine is only for you. Do not share this medicine with others. What if I miss a dose? If you take this medication on a regular basis, take it as soon as you can. If it is almost time for your next dose, take only that dose. Do not take double or extra doses. What may interact with this medication? Caffeine Chloroquine Cisapride Diuretics Medications for colds Medications for depression or for emotional or psychotic conditions Medications for weight loss including some herbal products Methadone Pentamidine Some antibiotics like clarithromycin, erythromycin, levofloxacin, and linezolid Some heart medications Steroid hormones like dexamethasone, cortisone, hydrocortisone Theophylline Thyroid hormones This list may not describe all possible interactions. Give your health care provider a list of all the medicines, herbs, non-prescription drugs, or dietary supplements you use. Also tell them if you smoke, drink alcohol, or use illegal drugs. Some items may interact with your medicine. What should I watch for while using this medication? Visit your care team for regular checks on your progress. Tell your care team if your symptoms do not start to get better or if they get worse. If your symptoms get worse or if you are using this medication more than normal, call your care team right away. Do not treat yourself for coughs, colds or allergies without asking your care team for advice. Some nonprescription medications can affect this one. You and your care team should develop an Asthma Action Plan that is just for you. Be sure to know what to do if you are in the yellow (asthma is getting worse) or red (medical  alert) zones. Your mouth may get dry. Chewing sugarless gum or sucking hard candy and drinking plenty of water may help. Contact your health care provider if the problem does not go away or  is severe. What side effects may I notice from receiving this medication? Side effects that you should report to your care team as soon as possible: Allergic reactions--skin rash, itching, hives, swelling of the face, lips, tongue, or throat Heart rhythm changes--fast or irregular heartbeat, dizziness, feeling faint or lightheaded, chest pain, trouble breathing Increase in blood pressure Muscle pain or cramps Wheezing or trouble breathing that is worse after use Side effects that usually do not require medical attention (report to your care team if they continue or are bothersome): Change in taste Dry mouth Headache Sore throat Tremors or shaking Trouble sleeping This list may not describe all possible side effects. Call your doctor for medical advice about side effects. You may report side effects to FDA at 1-800-FDA-1088. Where should I keep my medication? Keep out of the reach of children and pets. Store at room temperature between 20 and 25 degrees C (68 and 77 degrees F). Keep inhaler away from extreme heat and cold. Get rid of it when the dose counter reads 0 or after the expiration date, whichever is first. To get rid of medications that are no longer needed or have expired: Take the medication to a medication take-back program. Check with your pharmacy or law enforcement to find a location. If you cannot return the medication, ask your care team how to get rid of this medication safely. NOTE: This sheet is a summary. It may not cover all possible information. If you have questions about this medicine, talk to your doctor, pharmacist, or health care provider.  2022 Elsevier/Gold Standard (2020-04-11 00:00:00)

## 2021-06-24 NOTE — Progress Notes (Signed)
.  Subjective: CC: Dyspnea on exertion, knee pain PCP: Kim Norlander, DO Kim Wilkerson is a 76 y.o. female presenting to clinic today for:  1.  Dyspnea on exertion/ left knee pain Patient reports that she has been having dyspnea on exertion for the last several months.  She does not report any hemoptysis, coughing or wheezing.  No chest pain.  She is currently trying to get started on the CPAP machine but has had some difficulty with excessive dry mouth with this.  She will be using a chinstrap for the first time this evening and hopefully this will help.  She admits that she is not as mobile due to some chronic left-sided knee pain.  She was recently told that she had a small fracture in that knee that required bracing.  She supposed to be following up with her specialist in the next couple of weeks for recheck.  She thinks she needs a total knee replacement.  She notes that pain has been relatively uncontrolled since she has been off of the tramadol.  She uses diclofenac topically.  Denies having had any issues with the tramadol including constipation, falls, dizziness or sedation.  She cannot take NSAIDs orally secondary to severe GERD.  2.  Hypertension Patient has not taken any of her medications yet this morning.  No chest pain.  She reports shortness of breath as above.    ROS: Per HPI  No Known Allergies Past Medical History:  Diagnosis Date   Arthritis    Functional heart murmur 2019   Hypertension 1980   Leg ulcer ([place])    Morbid obesity ([place])    Varicose veins     Current Outpatient Medications:    acetaminophen (TYLENOL) 650 MG CR tablet, Take 650 mg by mouth every 8 (eight) hours as needed for pain., Disp: , Rfl:    albuterol (VENTOLIN HFA) 108 (90 Base) MCG/ACT inhaler, Inhale 2 puffs into the lungs every 6 (six) hours as needed for wheezing or shortness of breath., Disp: 8 g, Rfl: 0   Ascorbic Acid 53 MG LOZG, Take by mouth., Disp: , Rfl:    atorvastatin  (LIPITOR) 40 MG tablet, TAKE 1 TABLET (40 MG TOTAL) BY MOUTH DAILY., Disp: 90 tablet, Rfl: 0   diclofenac Sodium (VOLTAREN) 1 % GEL, Apply 4 g topically 4 (four) times daily. As needed for joint pain, Disp: 400 g, Rfl: 3   diltiazem (CARTIA XT) 240 MG 24 hr capsule, Take 1 capsule (240 mg total) by mouth daily., Disp: 90 capsule, Rfl: 3   famotidine (PEPCID) 40 MG tablet, Take 1 tablet (40 mg total) by mouth 2 (two) times daily., Disp: 180 tablet, Rfl: 3   furosemide (LASIX) 40 MG tablet, Take 1 tablet (40 mg total) by mouth daily., Disp: 90 tablet, Rfl: 3   lansoprazole (PREVACID) 30 MG capsule, Take 1 capsule (30 mg total) by mouth daily at 12 noon., Disp: 90 capsule, Rfl: 3   losartan (COZAAR) 50 MG tablet, TAKE 1 TABLET(50 MG) BY MOUTH DAILY, Disp: 90 tablet, Rfl: 3   traMADol (ULTRAM) 50 MG tablet, Take 1 tablet (50 mg total) by mouth 3 (three) times daily as needed., Disp: 30 tablet, Rfl: 0 Social History   Socioeconomic History   Marital status: Widowed    Spouse name: Not on file   Number of children: 3   Years of education: 12   Highest education level: High school graduate  Occupational History   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  Tobacco Use   Smoking status: Former    Types: Cigarettes    Quit date: 05/11/1975    Years since quitting: 46.1   Smokeless tobacco: Never  Vaping Use   Vaping Use: Never used  Substance and Sexual Activity   Alcohol use: Yes    Alcohol/week: 0.0 standard drinks    Comment: 1 glass of wine every 2-3 weeks   Drug use: No   Sexual activity: Not Currently    Birth control/protection: Surgical  Other Topics Concern   Not on file  Social History Narrative   Not on file   Social Determinants of Health   Financial Resource Strain: Not on file  Food Insecurity: Not on file  Transportation Needs: Not on file  Physical Activity: Not on file  Stress: Not on file  Social Connections: Not on file  Intimate Partner Violence: Not on  file   Family History  Problem Relation Age of Onset   Hypertension Mother    Stroke Mother        after 32   Hyperlipidemia Mother    Thyroid disease Mother    Hypertension Father    Cirrhosis Father    Alcohol abuse Father    Heart attack Sister 78   Hypertension Brother    Ovarian cancer Neg Hx    Breast cancer Neg Hx    Prostate cancer Neg Hx    Colon cancer Neg Hx     Objective: Office vital signs reviewed. BP (!) 159/86    Pulse 66    Temp 97.6 F (36.4 C)    Ht 5' 9"  (1.753 m)    Wt 294 lb 9.6 oz (133.6 kg)    SpO2 99%    BMI 43.50 kg/m   Physical Examination:  General: Awake, alert, morbidly obese, No acute distress HEENT: Sclera white.  Moist mucous membranes Cardio: regular rate and rhythm, S1S2 heard, very soft murmur appreciated Pulm: clear to auscultation bilaterally, no wheezes, rhonchi or rales; normal work of breathing on room air Extremities: warm, well perfused, she has very large legs but no pitting edema present MSK: Antalgic gait and station.  Ambulating with use of cane  Left knee: Exquisite tenderness palpation to the patella, joint line and posterior popliteal fossa.  No tenderness palpation to the right knee  Assessment/ Plan: 76 y.o. female   Essential hypertension, benign - Plan: CMP14+EGFR  Mixed hyperlipidemia - Plan: CMP14+EGFR, Lipid panel  Morbid obesity ([place]) - Plan: Bayer DCA Hb A1c Waived  Functional heart murmur - Plan: CBC, DG Chest 2 View  Chronic pain of left knee - Plan: traMADol (ULTRAM) 50 MG tablet  Dyspnea on exertion - Plan: albuterol (VENTOLIN HFA) 108 (90 Base) MCG/ACT inhaler, DG Chest 2 View  Blood pressure not controlled.  She will return in 2 weeks for blood pressure recheck.  Advised her to take her blood pressure medication at least 1 hour prior to arrival.  She will do fasting labs and chest x-ray at that visit as well  Today continue statin  Check A1c given morbid obesity.  Weight loss recommended  Tramadol  renewed.  National narcotic database reviewed and there were no red flags.  Further refills per specialty.  Dyspnea on exertion I think this is largely due to her morbid obesity and physical deconditioning.  However, we will plan for two-view x-ray and referral to pulmonology for further evaluation.  Albuterol inhaler ordered and instructions for use reviewed.  A spacer was  also given today.  She will let me know if this is helpful  Orders Placed This Encounter  Procedures   DG Chest 2 View    Standing Status:   Future    Standing Expiration Date:   06/24/2022    Order Specific Question:   Reason for Exam (SYMPTOM  OR DIAGNOSIS REQUIRED)    Answer:   DOE, morbid obesity    Order Specific Question:   Preferred imaging location?    Answer:   Internal   CMP14+EGFR    Standing Status:   Future    Standing Expiration Date:   06/24/2022   Lipid panel    Standing Status:   Future    Standing Expiration Date:   06/24/2022   Bayer DCA Hb A1c Waived    Standing Status:   Future    Standing Expiration Date:   06/24/2022   CBC    Standing Status:   Future    Standing Expiration Date:   06/24/2022   Meds ordered this encounter  Medications   traMADol (ULTRAM) 50 MG tablet    Sig: Take 1 tablet (50 mg total) by mouth 3 (three) times daily as needed.    Dispense:  30 tablet    Refill:  0   albuterol (VENTOLIN HFA) 108 (90 Base) MCG/ACT inhaler    Sig: Inhale 2 puffs into the lungs every 6 (six) hours as needed for wheezing or shortness of breath.    Dispense:  8 g    Refill:  [place], DO [place] [phone]

## 2021-06-29 ENCOUNTER — Other Ambulatory Visit: Payer: Self-pay

## 2021-06-29 ENCOUNTER — Encounter: Payer: Self-pay | Admitting: Internal Medicine

## 2021-06-29 ENCOUNTER — Ambulatory Visit: Payer: Medicare PPO | Admitting: Internal Medicine

## 2021-06-29 DIAGNOSIS — R0609 Other forms of dyspnea: Secondary | ICD-10-CM

## 2021-06-29 NOTE — Assessment & Plan Note (Addendum)
Onset 2022 with wt gain from baseline of 238 to 297 as of 06/29/2021  - Echo [id number]  G 1 diastolic dysfunction wih mild MR/TR and PAS 35  - 06/29/2021   Walked on RA  approx 100  ft  @ slow pace, stopped due to sob  with lowest 02 sats 97%   Symptoms are  disproportionate to objective findings and not clear to what extent this is actually a pulmonary  problem but pt does appear to have difficult to sort out respiratory symptoms of unknown origin for which  DDX  = almost all start with A and  include Adherence, Ace Inhibitors, Acid Reflux, Active Sinus Disease, Alpha 1 Antitripsin deficiency, Anxiety masquerading as Airways dz,  ABPA,  Allergy(esp in young), Aspiration (esp in elderly), Adverse effects of meds,  Active smoking or Vaping, A bunch of PE's/clot burden (a few small clots can't cause this syndrome unless there is already severe underlying pulm or vascular dz with poor reserve),  Anemia or thyroid disorder, plus two Bs  = Bronchiectasis and Beta blocker use..and one C= CHF   Adherence is always the initial "prime suspect" and is a multilayered concern that requires a "trust but verify" approach in every patient - starting with knowing how to use medications, especially inhalers, correctly, keeping up with refills and understanding the fundamental difference between maintenance and prns vs those medications only taken for a very short course and then stopped and not refilled.   ? Acid (or non-acid) GERD > always difficult to exclude as up to 75% of pts in some series report no assoc GI/ Heartburn symptoms> rec max (24h)  acid suppression and diet restrictions/ reviewed and instructions given in writing.   ? Anemia/ throid dz > needs labs checked/ planned by pcp   ? Anxiety/depression/ deconditioning assoc with wt gain > 100 lb over baseline  > usually at the bottom of this list of usual suspects but   may interfere with adherence and also interpretation of response or lack thereof to symptom  management which can be quite subjective.   ? Allergy/ asthma > nothing to suggest but will do pfts before and after on reutrn   ? Adverse drug effects > none of the usual suspects listed  ? A bunch of PE's >  Also nothing to suggest based on the chronicity  ? CHF > could have worsening diastolic dysfunction or PH from noct hypoxemia/ CPAP so will do echo on return   Each maintenance medication was reviewed in detail including emphasizing most importantly the difference between maintenance and prns and under what circumstances the prns are to be triggered using an action plan format where appropriate.  Total time for H and P, chart review, counseling,  directly observing portions of ambulatory 02 saturation study/ and generating customized AVS unique to this initial office visit / same day charting  > 45 min

## 2021-06-29 NOTE — Assessment & Plan Note (Signed)
Body mass index is 45.16 kg/m.  -  trending up  Lab Results  Component Value Date   TSH 1.940 08/28/2019      Contributing to doe and risk of worsening GERD/ risk of PE and worse DJD knees >>>   reviewed the need and the process to achieve and maintain neg calorie balance > defer f/u primary care including intermittently monitoring thyroid status

## 2021-06-29 NOTE — Patient Instructions (Addendum)
In addition to the blood work you have planned next week you need TSH and BNP with chest xray and I will be able to see the results in our computer  Prevacid 30 mg   Take  30-60 min before first meal of the day and Pepcid (famotidine)  40 mg after supper until return to office - this is the best way to tell whether stomach acid is contributing to your problem.   GERD (REFLUX)  is an extremely common cause of respiratory symptoms just like yours , many times with no obvious heartburn at all.    It can be treated with medication, but also with lifestyle changes including elevation of the head of your bed  (wedge pillow will have to do because you can't use bedblocks)    avoidance of late meals (eat a small meal 4 hours before bed) , excessive alcohol, and avoid fatty foods, chocolate, peppermint, colas, red wine, and acidic juices such as orange juice.  NO MINT OR MENTHOL PRODUCTS SO NO COUGH DROPS  USE SUGARLESS CANDY INSTEAD (Jolley ranchers or Stover's or Life Savers) or even ice chips will also do - the key is to swallow to prevent all throat clearing. NO OIL BASED VITAMINS - use powdered substitutes.  Avoid fish oil when coughing.    Please schedule a follow up visit in 3 months but call sooner if needed - PFTs and Echocardiogram  on return same day if all possible

## 2021-06-29 NOTE — Progress Notes (Signed)
Kim Wilkerson, female    DOB: March 09, 1946,   MRN: [id number]   Brief patient profile:  24   yobf quit smoking 1977 with wt baseline 180 with progressive wt gain referred to pulmonary clinic in [place]  06/29/2021 by DR [facility] for doe.  Last time wt was down 238 2022 slowed by knees and progressive wt gain since then    History of Present Illness  06/29/2021  Pulmonary/ 1st office eval/ Arlyne Brandes / [facility]  Chief Complaint  Patient presents with   Consult    Ref by Dr. Donnie Mesa for DOE for the last 6 months. Worse with steps.  Dyspnea:  mailbox to house 50 ft flat / walks with cane  Cough: some choking at night and bad HB/ freq throat clearing  Sleep: cpap x 2 weeks  per [facility] per [place] Dr Wendi Snipes / only doing 2 hours initially and building up to 6h  SABA use: none  No obvious day to day or daytime variability or assoc excess/ purulent sputum or mucus plugs or hemoptysis or cp or chest tightness, subjective wheeze or overt sinus or hb symptoms.   sleeping without nocturnal  or early am exacerbation  of respiratory  c/o's or need for noct saba. Also denies any obvious fluctuation of symptoms with weather or environmental changes or other aggravating or alleviating factors except as outlined above   No unusual exposure hx or h/o childhood pna/ asthma or knowledge of premature birth.  Current Allergies, Complete Past Medical History, Past Surgical History, Family History, and Social History were reviewed in [employer] record.  ROS  The following are not active complaints unless bolded Hoarseness, sore throat, dysphagia, dental problems, itching, sneezing,  nasal congestion or discharge of excess mucus or purulent secretions, ear ache,   fever, chills, sweats, unintended wt loss or wt gain, classically pleuritic or exertional cp,  orthopnea pnd or arm/hand swelling  or leg swelling, presyncope, palpitations, abdominal pain, anorexia, nausea,  vomiting, diarrhea  or change in bowel habits or change in bladder habits, change in stools or change in urine, dysuria, hematuria,  rash, arthralgias esp knees , visual complaints, headache, numbness, weakness or ataxia or problems with walking or coordination,  change in mood or  memory.              Past Medical History:  Diagnosis Date   Arthritis    Functional heart murmur 2019   Hypertension 1980   Leg ulcer ([place])    Morbid obesity ([place])    Varicose veins     Outpatient Medications Prior to Visit  Medication Sig Dispense Refill   acetaminophen (TYLENOL) 650 MG CR tablet Take 650 mg by mouth every 8 (eight) hours as needed for pain.     albuterol (VENTOLIN HFA) 108 (90 Base) MCG/ACT inhaler Inhale 2 puffs into the lungs every 6 (six) hours as needed for wheezing or shortness of breath. 8 g 0   Ascorbic Acid 53 MG LOZG Take by mouth.     atorvastatin (LIPITOR) 40 MG tablet TAKE 1 TABLET (40 MG TOTAL) BY MOUTH DAILY. 90 tablet 0   diclofenac Sodium (VOLTAREN) 1 % GEL Apply 4 g topically 4 (four) times daily. As needed for joint pain 400 g 3   diltiazem (CARTIA XT) 240 MG 24 hr capsule Take 1 capsule (240 mg total) by mouth daily. 90 capsule 3   famotidine (PEPCID) 40 MG tablet Take 1 tablet (40 mg total) by mouth 2 (  two) times daily. 180 tablet 3   furosemide (LASIX) 40 MG tablet Take 1 tablet (40 mg total) by mouth daily. 90 tablet 3   lansoprazole (PREVACID) 30 MG capsule Take 1 capsule (30 mg total) by mouth daily at 12 noon. 90 capsule 3   losartan (COZAAR) 50 MG tablet TAKE 1 TABLET(50 MG) BY MOUTH DAILY 90 tablet 3   traMADol (ULTRAM) 50 MG tablet Take 1 tablet (50 mg total) by mouth 3 (three) times daily as needed. 30 tablet 0   No facility-administered medications prior to visit.     Objective:     BP 136/78 (BP Location: Left Arm, Patient Position: Sitting)    Pulse 77    Temp 98.4 F (36.9 C) (Temporal)    Ht 5\' 8"  (1.727 m)    Wt 297 lb (134.7 kg)    SpO2 97%  Comment: ra   BMI 45.16 kg/m   SpO2: 97 % (ra)  Obese bf nad / throat clearing   Obese bf awkward gait    HEENT : pt wearing mask not removed for exam due to covid -19 concerns.    NECK :  without JVD/Nodes/TM/ nl carotid upstrokes bilaterally   LUNGS: no acc muscle use,  Nl contour chest distant bs bilaterally without cough on insp or exp maneuvers   CV:  RRR  distant s1s2, 2/6 SEM s obvious increase in P2, and 1+ pitting both LE's   ABD: quite obese  soft and nontender with nl inspiratory excursion in the supine position. No bruits or organomegaly appreciated, bowel sounds nl  MS:  awkward slow gait with cane, ext warm without deformities, calf tenderness, cyanosis or clubbing   SKIN: warm and dry without lesions    NEURO:  alert, approp, nl sensorium with  no motor or cerebellar deficits apparent.     Last cxr 05/19/11  wnl    Assessment   DOE (dyspnea on exertion) Onset 2022 with wt gain from baseline of 238 to 297 as of 06/29/2021  - Echo 123XX123  G 1 diastolic dysfunction wih mild MR/TR and PAS 35  - 06/29/2021   Walked on RA  approx 100  ft  @ slow pace, stopped due to sob  with lowest 02 sats 97%   Symptoms are  disproportionate to objective findings and not clear to what extent this is actually a pulmonary  problem but pt does appear to have difficult to sort out respiratory symptoms of unknown origin for which  DDX  = almost all start with A and  include Adherence, Ace Inhibitors, Acid Reflux, Active Sinus Disease, Alpha 1 Antitripsin deficiency, Anxiety masquerading as Airways dz,  ABPA,  Allergy(esp in young), Aspiration (esp in elderly), Adverse effects of meds,  Active smoking or Vaping, A bunch of PE's/clot burden (a few small clots can't cause this syndrome unless there is already severe underlying pulm or vascular dz with poor reserve),  Anemia or thyroid disorder, plus two Bs  = Bronchiectasis and Beta blocker use..and one C= CHF   Adherence is always the  initial "prime suspect" and is a multilayered concern that requires a "trust but verify" approach in every patient - starting with knowing how to use medications, especially inhalers, correctly, keeping up with refills and understanding the fundamental difference between maintenance and prns vs those medications only taken for a very short course and then stopped and not refilled.   ? Acid (or non-acid) GERD > always difficult to exclude as up to 75%  of pts in some series report no assoc GI/ Heartburn symptoms> rec max (24h)  acid suppression and diet restrictions/ reviewed and instructions given in writing.   ? Anemia/ throid dz > needs labs checked/ planned by pcp   ? Anxiety/depression/ deconditioning assoc with wt gain > 100 lb over baseline  > usually at the bottom of this list of usual suspects but   may interfere with adherence and also interpretation of response or lack thereof to symptom management which can be quite subjective.   ? Allergy/ asthma > nothing to suggest but will do pfts before and after on reutrn   ? Adverse drug effects > none of the usual suspects listed  ? A bunch of PE's >  Also nothing to suggest based on the chronicity  ? CHF > could have worsening diastolic dysfunction or PH from noct hypoxemia/ CPAP so will do echo on return           Morbid obesity ([place]) Body mass index is 45.16 kg/m.  -  trending up  Lab Results  Component Value Date   TSH 1.940 08/28/2019    Contributing to doe and risk of worsening GERD/ risk of PE and worse DJD knees >>>   reviewed the need and the process to achieve and maintain neg calorie balance > defer f/u primary care including intermittently monitoring thyroid status      Each maintenance medication was reviewed in detail including emphasizing most importantly the difference between maintenance and prns and under what circumstances the prns are to be triggered using an action plan format where appropriate.  Total time for H  and P, chart review, counseling,  directly observing portions of ambulatory 02 saturation study/ and generating customized AVS unique to this initial office visit / same day charting  > 45 min           Christinia Gully, MD 06/29/2021

## 2021-07-06 ENCOUNTER — Telehealth: Payer: Self-pay | Admitting: Internal Medicine

## 2021-07-06 DIAGNOSIS — R0609 Other forms of dyspnea: Secondary | ICD-10-CM

## 2021-07-06 NOTE — Telephone Encounter (Signed)
Called [facility]  and spoke to patient. She was referring to echocardiogram. Order was not placed during OV, so I placed order and let her know AP would be reaching out to schedule. Nothing further needed.

## 2021-07-07 DIAGNOSIS — M84352A Stress fracture, left femur, initial encounter for fracture: Secondary | ICD-10-CM | POA: Diagnosis not present

## 2021-07-07 DIAGNOSIS — M25561 Pain in right knee: Secondary | ICD-10-CM | POA: Diagnosis not present

## 2021-07-07 DIAGNOSIS — G4733 Obstructive sleep apnea (adult) (pediatric): Secondary | ICD-10-CM | POA: Diagnosis not present

## 2021-07-07 DIAGNOSIS — M170 Bilateral primary osteoarthritis of knee: Secondary | ICD-10-CM | POA: Diagnosis not present

## 2021-07-07 DIAGNOSIS — M2352 Chronic instability of knee, left knee: Secondary | ICD-10-CM | POA: Diagnosis not present

## 2021-07-08 ENCOUNTER — Other Ambulatory Visit: Payer: Medicare PPO

## 2021-07-08 ENCOUNTER — Ambulatory Visit: Payer: Medicare PPO

## 2021-07-08 ENCOUNTER — Other Ambulatory Visit (INDEPENDENT_AMBULATORY_CARE_PROVIDER_SITE_OTHER): Payer: Medicare PPO

## 2021-07-08 DIAGNOSIS — R0609 Other forms of dyspnea: Secondary | ICD-10-CM

## 2021-07-08 DIAGNOSIS — R010 Benign and innocent cardiac murmurs: Secondary | ICD-10-CM

## 2021-07-08 DIAGNOSIS — R0600 Dyspnea, unspecified: Secondary | ICD-10-CM | POA: Diagnosis not present

## 2021-07-08 DIAGNOSIS — I10 Essential (primary) hypertension: Secondary | ICD-10-CM

## 2021-07-08 DIAGNOSIS — E782 Mixed hyperlipidemia: Secondary | ICD-10-CM | POA: Diagnosis not present

## 2021-07-08 DIAGNOSIS — Z0130 Encounter for examination of blood pressure without abnormal findings: Secondary | ICD-10-CM

## 2021-07-08 LAB — LIPID PANEL
Chol/HDL Ratio: 2.1 ratio (ref 0.0–4.4)
Cholesterol, Total: 131 mg/dL (ref 100–199)
HDL: 62 mg/dL (ref >39)
LDL Chol Calc (NIH): 58 mg/dL (ref 0–99)
Triglycerides: 45 mg/dL (ref 0–149)
VLDL Cholesterol Cal: 11 mg/dL (ref 5–40)

## 2021-07-08 LAB — CMP14+EGFR
ALT: 7 IU/L (ref 0–32)
AST: 12 IU/L (ref 0–40)
Albumin/Globulin Ratio: 1.7 (ref 1.2–2.2)
Albumin: 4.3 g/dL (ref 3.7–4.7)
Alkaline Phosphatase: 174 IU/L — ABNORMAL HIGH (ref 44–121)
BUN/Creatinine Ratio: 18 (ref 12–28)
BUN: 15 mg/dL (ref 8–27)
Bilirubin Total: 0.4 mg/dL (ref 0.0–1.2)
CO2: 24 mmol/L (ref 20–29)
Calcium: 9.4 mg/dL (ref 8.7–10.3)
Chloride: 105 mmol/L (ref 96–106)
Creatinine, Ser: 0.83 mg/dL (ref 0.57–1.00)
Globulin, Total: 2.5 g/dL (ref 1.5–4.5)
Glucose: 134 mg/dL — ABNORMAL HIGH (ref 70–99)
Potassium: 4.6 mmol/L (ref 3.5–5.2)
Sodium: 142 mmol/L (ref 134–144)
Total Protein: 6.8 g/dL (ref 6.0–8.5)
eGFR: 74 mL/min/{1.73_m2} (ref >59)

## 2021-07-08 LAB — CBC
Hematocrit: 37.3 % (ref 34.0–46.6)
Hemoglobin: 12 g/dL (ref 11.1–15.9)
MCH: 25.3 pg — ABNORMAL LOW (ref 26.6–33.0)
MCHC: 32.2 g/dL (ref 31.5–35.7)
MCV: 79 fL (ref 79–97)
Platelets: 294 10*3/uL (ref 150–450)
RBC: 4.74 x10E6/uL (ref 3.77–5.28)
RDW: 14.1 % (ref 11.7–15.4)
WBC: 9.8 10*3/uL (ref 3.4–10.8)

## 2021-07-08 LAB — BAYER DCA HB A1C WAIVED: HB A1C (BAYER DCA - WAIVED): 5.3 % (ref 4.8–5.6)

## 2021-07-08 NOTE — Progress Notes (Cosign Needed)
Patient here today to have blood pressure checked.  Blood pressure is 158/79, pulse 65. ?

## 2021-07-13 DIAGNOSIS — G4733 Obstructive sleep apnea (adult) (pediatric): Secondary | ICD-10-CM | POA: Diagnosis not present

## 2021-08-04 DIAGNOSIS — M25561 Pain in right knee: Secondary | ICD-10-CM | POA: Diagnosis not present

## 2021-08-04 DIAGNOSIS — M25562 Pain in left knee: Secondary | ICD-10-CM | POA: Diagnosis not present

## 2021-08-04 DIAGNOSIS — M170 Bilateral primary osteoarthritis of knee: Secondary | ICD-10-CM | POA: Diagnosis not present

## 2021-08-20 ENCOUNTER — Other Ambulatory Visit: Payer: Self-pay | Admitting: Family Medicine

## 2021-08-20 DIAGNOSIS — I10 Essential (primary) hypertension: Secondary | ICD-10-CM

## 2021-08-20 DIAGNOSIS — K219 Gastro-esophageal reflux disease without esophagitis: Secondary | ICD-10-CM

## 2021-08-20 DIAGNOSIS — E782 Mixed hyperlipidemia: Secondary | ICD-10-CM

## 2021-09-02 DIAGNOSIS — M25562 Pain in left knee: Secondary | ICD-10-CM | POA: Diagnosis not present

## 2021-09-02 DIAGNOSIS — M170 Bilateral primary osteoarthritis of knee: Secondary | ICD-10-CM | POA: Diagnosis not present

## 2021-09-04 DIAGNOSIS — G4733 Obstructive sleep apnea (adult) (pediatric): Secondary | ICD-10-CM | POA: Diagnosis not present

## 2021-09-12 DIAGNOSIS — G4733 Obstructive sleep apnea (adult) (pediatric): Secondary | ICD-10-CM | POA: Diagnosis not present

## 2021-09-16 ENCOUNTER — Ambulatory Visit (HOSPITAL_COMMUNITY)
Admission: RE | Admit: 2021-09-16 | Discharge: 2021-09-16 | Disposition: A | Discharge status: Home / Self Care | Payer: Medicare PPO | Source: Ambulatory Visit | Attending: Internal Medicine | Admitting: Internal Medicine

## 2021-09-16 DIAGNOSIS — R0609 Other forms of dyspnea: Secondary | ICD-10-CM | POA: Diagnosis not present

## 2021-09-16 LAB — ECHOCARDIOGRAM COMPLETE
AR max vel: 2.12 cm2
AV Area VTI: 2.18 cm2
AV Area mean vel: 1.92 cm2
AV Mean grad: 5 mmHg
AV Peak grad: 10.2 mmHg
Ao pk vel: 1.6 m/s
Area-P 1/2: 2.88 cm2
Calc EF: 52.7 %
MV VTI: 2.59 cm2
S' Lateral: 4 cm
Single Plane A2C EF: 53.9 %
Single Plane A4C EF: 50.1 %

## 2021-09-16 NOTE — Progress Notes (Signed)
*  PRELIMINARY RESULTS* ?Echocardiogram ?2D Echocardiogram has been performed. ? ?Kim Wilkerson ?09/16/2021, 10:20 AM ?

## 2021-09-23 ENCOUNTER — Telehealth: Payer: Self-pay | Admitting: Family Medicine

## 2021-09-23 ENCOUNTER — Ambulatory Visit (INDEPENDENT_AMBULATORY_CARE_PROVIDER_SITE_OTHER): Payer: Medicare PPO | Admitting: Nurse Practitioner

## 2021-09-23 ENCOUNTER — Encounter: Payer: Self-pay | Admitting: Nurse Practitioner

## 2021-09-23 VITALS — BP 113/69 | HR 76 | Ht 68.0 in | Wt 289.8 lb

## 2021-09-23 DIAGNOSIS — R252 Cramp and spasm: Secondary | ICD-10-CM | POA: Diagnosis not present

## 2021-09-23 MED ORDER — CYCLOBENZAPRINE HCL 5 MG PO TABS: 5.0000 mg | ORAL_TABLET | Freq: Three times a day (TID) | ORAL | 1 refills | Status: DC | PRN

## 2021-09-23 NOTE — Telephone Encounter (Signed)
Please have patient make an appointment with PCP for chronic disease management. She is not type 2 diabetic and Joycelyn Man will be needing a prior Auth. I saw patient today for leg crams. (DOD). Thank you ?

## 2021-09-23 NOTE — Patient Instructions (Signed)
Here is a guide to help [place] find out which weight loss medications will be covered by your insurance plan.  ?Please check out this web site  [employer] and follow the 3 simple steps.  ? ?There is also a phone number you can call if you do not have access to the Internet. [phone] (Monday- Friday 8am-8pm) ? ?[employer] provides coverage information for more than 80% of the inquiries submitted!!  ? ? ?Obesity, Adult ?Obesity is having too much body fat. Being obese means that your weight is more than what is healthy for you.  ?BMI (body mass index) is a number that explains how much body fat you have. If you have a BMI of 30 or more, you are obese. ?Obesity can cause serious health problems, such as: ?Stroke. ?Coronary artery disease (CAD). ?Type 2 diabetes. ?Some types of cancer. ?High blood pressure (hypertension). ?High cholesterol. ?Gallbladder stones. ?Obesity can also contribute to: ?Osteoarthritis. ?Sleep apnea. ?Infertility problems. ?What are the causes? ?Eating meals each day that are high in calories, sugar, and fat. ?Drinking a lot of drinks that have sugar in them. ?Being born with genes that may make you more likely to become obese. ?Having a medical condition that causes obesity. ?Taking certain medicines. ?Sitting a lot (having a sedentary lifestyle). ?Not getting enough sleep. ?What increases the risk? ?Having a family history of obesity. ?Living in an area with limited access to: ?[place], recreation centers, or sidewalks. ?Healthy food choices, such as grocery stores and farmers' markets. ?What are the signs or symptoms? ?The main sign is having too much body fat. ?How is this treated? ?Treatment for this condition often includes changing your lifestyle. Treatment may include: ?Changing your diet. This may include making a healthy meal plan. ?Exercise. This may include activity that causes your heart to beat faster (aerobic exercise) and strength training. Work with your doctor to design a  program that works for you. ?Medicine to help you lose weight. This may be used if you are not able to lose one pound a week after 6 weeks of healthy eating and more exercise. ?Treating conditions that cause the obesity. ?Surgery. Options may include gastric banding and gastric bypass. This may be done if: ?Other treatments have not helped to improve your condition. ?You have a BMI of 40 or higher. ?You have life-threatening health problems related to obesity. ?Follow these instructions at home: ?Eating and drinking ? ?Follow advice from your doctor about what to eat and drink. Your doctor may tell you to: ?Limit fast food, sweets, and processed snack foods. ?Choose low-fat options. For example, choose low-fat milk instead of whole milk. ?Eat five or more servings of fruits or vegetables each day. ?Eat at home more often. This gives you more control over what you eat. ?Choose healthy foods when you eat out. ?Learn to read food labels. This will help you learn how much food is in one serving. ?Keep low-fat snacks available. ?Avoid drinks that have a lot of sugar in them. These include soda, fruit juice, iced tea with sugar, and flavored milk. ?Drink enough water to keep your pee (urine) pale yellow. ?Do not go on fad diets. ?Physical activity ?Exercise often, as told by your doctor. Most adults should get up to 150 minutes of moderate-intensity exercise every week.Ask your doctor: ?What types of exercise are safe for you. ?How often you should exercise. ?Warm up and stretch before being active. ?Do slow stretching after being active (cool down). ?Rest between times of being active. ?  Lifestyle ?Work with your doctor and a Publishing rights manager (dietitian) to set a weight-loss goal that is best for you. ?Limit your screen time. ?Find ways to reward yourself that do not involve food. ?Do not drink alcohol if: ?Your doctor tells you not to drink. ?You are pregnant, may be pregnant, or are planning to become pregnant. ?If you drink  alcohol: ?Limit how much you have to: ?0-1 drink a day for women. ?0-2 drinks a day for men. ?Know how much alcohol is in your drink. In the [place] one drink equals one 12 oz bottle of beer (355 mL), one 5 oz glass of wine (148 mL), or one 1? oz glass of hard liquor (44 mL). ?General instructions ?Keep a weight-loss journal. This can help you keep track of: ?The food that you eat. ?How much exercise you get. ?Take over-the-counter and prescription medicines only as told by your doctor. ?Take vitamins and supplements only as told by your doctor. ?Think about joining a support group. ?Pay attention to your mental health as obesity can lead to depression or self esteem issues. ?Keep all follow-up visits. ?Contact a doctor if: ?You cannot meet your weight-loss goal after you have changed your diet and lifestyle for 6 weeks. ?You are having trouble breathing. ?Summary ?Obesity is having too much body fat. ?Being obese means that your weight is more than what is healthy for you. ?Work with your doctor to set a weight-loss goal. ?Get regular exercise as told by your doctor. ?This information is not intended to replace advice given to you by your health care provider. Make sure you discuss any questions you have with your health care provider. ?Document Revised: 12/02/2020 Document Reviewed: 12/02/2020 ?Elsevier Patient Education ? [place]. ?Leg Cramps ?Leg cramps occur when one or more muscles tighten and a person has no control over it (involuntary muscle contraction). Muscle cramps are most common in the calf muscles of the leg. They can occur during exercise or at rest. Leg cramps are painful, and they may last for a few seconds to a few minutes. Cramps may return several times before they finally stop. ?Usually, leg cramps are not caused by a serious medical problem. In many cases, the cause is not known. Some common causes include: ?Excessive physical effort (overexertion), such as during intense  exercise. ?Doing the same motion over and over. ?Staying in a certain position for a long period of time. ?Improper preparation, form, or technique while doing a sport or an activity. ?Dehydration. ?Injury. ?Side effects of certain medicines. ?Abnormally low levels of minerals in your blood (electrolytes), especially potassium and calcium. This could result from: ?Pregnancy. ?Taking diuretic medicines. ?Follow these instructions at home: ?Eating and drinking ?Drink enough fluid to keep your urine pale yellow. Staying hydrated may help prevent cramps. ?Eat a healthy diet that includes plenty of nutrients to help your muscles function. A healthy diet includes fruits and vegetables, lean protein, whole grains, and low-fat or nonfat dairy products. ?Managing pain, stiffness, and swelling ? ?  ? ?Try massaging, stretching, and relaxing the affected muscle. Do this for several minutes at a time. ?If directed, put ice on areas that are sore or painful after a cramp. To do this: ?Put ice in a plastic bag. ?Place a towel between your skin and the bag. ?Leave the ice on for 20 minutes, 2-3 times a day. ?Remove the ice if your skin turns bright red. This is very important. If you cannot feel pain, heat, or cold, you  have a greater risk of damage to the area. ?If directed, apply heat to muscles that are tense or tight. Do this before you exercise, or as often as told by your health care provider. Use the heat source that your health care provider recommends, such as a moist heat pack or a heating pad. To do this: ?Place a towel between your skin and the heat source. ?Leave the heat on for 20-30 minutes. ?Remove the heat if your skin turns bright red. This is especially important if you are unable to feel pain, heat, or cold. You may have a greater risk of getting burned. ?Try taking hot showers or baths to help relax tight muscles. ?General instructions ?If you are having frequent leg cramps, avoid intense exercise for several  days. ?Take over-the-counter and prescription medicines only as told by your health care provider. ?Keep all follow-up visits. This is important. ?Contact a health care provider if: ?Your leg cramps get more severe o

## 2021-09-23 NOTE — Progress Notes (Signed)
? ?Acute Office Visit ? ?Subjective:  ? ?  ?Patient ID: Kim Wilkerson, female    DOB: [birth year], 76 y.o.   MRN: [id number] ? ?Chief Complaint  ?Patient presents with  ? leg cramps  ?  Cramps in leg and feet  ? ? ?Leg Pain  ?The incident occurred more than 1 week ago. The incident occurred at home. There was no injury mechanism. The pain is present in the left leg and right leg. The quality of the pain is described as cramping. The pain is at a severity of 6/10. The pain is severe. The pain has been Constant since onset. Pertinent negatives include no loss of motion, numbness or tingling. She reports no foreign bodies present. The symptoms are aggravated by movement and palpation. She has tried acetaminophen for the symptoms. The treatment provided no relief.  ? ?Concerning patient's weight, according to patient nothing seems to be working.  BMI 44.06 kg a.m. with a total body weight of 289 pounds.  Patient would like to lose weight for better health. ? ?Review of Systems  ?Constitutional: Negative.   ?HENT: Negative.    ?Eyes: Negative.   ?Respiratory: Negative.    ?Cardiovascular: Negative.   ?Genitourinary: Negative.   ?Musculoskeletal:  Positive for joint pain.  ?Skin: Negative.  Negative for rash.  ?Neurological:  Negative for tingling and numbness.  ?All other systems reviewed and are negative. ? ? ?   ?Objective:  ?  ?BP 113/69   Pulse 76   Ht 5\' 8"  (1.727 m)   Wt 289 lb 12.8 oz (131.5 kg)   SpO2 99%   BMI 44.06 kg/m?  ?BP Readings from Last 3 Encounters:  ?09/23/21 113/69  ?06/29/21 136/78  ?06/24/21 (!) 159/86  ? ?Wt Readings from Last 3 Encounters:  ?09/23/21 289 lb 12.8 oz (131.5 kg)  ?06/29/21 297 lb (134.7 kg)  ?06/24/21 294 lb 9.6 oz (133.6 kg)  ? ?  ? ?Physical Exam ?Vitals and nursing note reviewed.  ?Constitutional:   ?   Appearance: She is obese.  ?HENT:  ?   Head: Normocephalic.  ?   Right Ear: External ear normal.  ?   Left Ear: External ear normal.  ?   Mouth/Throat:  ?   Mouth: Mucous  membranes are moist.  ?   Pharynx: Oropharynx is clear.  ?Eyes:  ?   Conjunctiva/sclera: Conjunctivae normal.  ?Cardiovascular:  ?   Rate and Rhythm: Normal rate and regular rhythm.  ?Pulmonary:  ?   Effort: Pulmonary effort is normal.  ?   Breath sounds: Normal breath sounds.  ?Abdominal:  ?   General: Bowel sounds are normal.  ?Musculoskeletal:  ?   Right lower leg: Tenderness present. No deformity.  ?   Left lower leg: Tenderness present. No deformity.  ?   Comments: Bilateral lower leg cramping.  ?Skin: ?   General: Skin is warm.  ?   Findings: No rash.  ?Neurological:  ?   Mental Status: She is alert and oriented to person, place, and time.  ?Psychiatric:     ?   Behavior: Behavior normal.  ? ? ?No results found for any visits on 09/23/21. ? ? ?   ?Assessment & Plan:  ?Bilateral lower extremity leg pain and cramping not well controlled in the past 2 to 3 weeks.  Started patient on Flexeril 5 mg tablet by mouth as needed.  Encourage patient to lose weight to reduce workload on joints.  Patient verbalized understanding.  ? ? ?  Concerning patient's weight loss she has tried diet exercise with no success.  Patient wants to try weight loss medication.  Education provided to patient printed handouts given.  Prior authorization in progress for weight loss medication.  We will start patient based on insurance approval with education. ? ? ?Problem List Items Addressed This Visit   ? ?  ? Other  ? Morbid obesity (HCC)  ? Leg cramps - Primary  ? Relevant Medications  ? cyclobenzaprine (FLEXERIL) 5 MG tablet  ? ? ?Meds ordered this encounter  ?Medications  ? cyclobenzaprine (FLEXERIL) 5 MG tablet  ?  Sig: Take 1 tablet (5 mg total) by mouth 3 (three) times daily as needed for muscle spasms.  ?  Dispense:  30 tablet  ?  Refill:  1  ?  Order Specific Question:   Supervising Provider  ?  AnswerMechele Claude:   STACKS, WARREN [[id number]]  ? ? ?Return if symptoms worsen or fail to improve. ? ?Daryll Drownnyeje M Mellany Dinsmore, NP ? ? ?

## 2021-09-25 ENCOUNTER — Telehealth: Payer: Self-pay | Admitting: Family Medicine

## 2021-09-25 ENCOUNTER — Ambulatory Visit: Payer: Medicare PPO | Admitting: Internal Medicine

## 2021-09-25 ENCOUNTER — Encounter: Payer: Self-pay | Admitting: Internal Medicine

## 2021-09-25 ENCOUNTER — Other Ambulatory Visit: Payer: Self-pay | Admitting: Nurse Practitioner

## 2021-09-25 DIAGNOSIS — R0609 Other forms of dyspnea: Secondary | ICD-10-CM

## 2021-09-25 NOTE — Patient Instructions (Addendum)
Pantoprazole (protonix) 40 mg   Take  30-60 min before first meal of the day and Pepcid (famotidine)  40 mg after supper      We will walk you again today for a baseline in case your breathing ever gets worse   Best bet is to let your PCP work on your blood pressure and wt loss with pulmonary follow up as needed   We will need to set up a PFT in [place] or [place] next available and call you with the results and get you back in if we see a lung problem, but I don't think you have one at this point.

## 2021-09-25 NOTE — Progress Notes (Unsigned)
Kim Wilkerson, female    DOB: 1945/06/07,   MRN: [id number]   Brief patient profile:  65   yobf quit smoking 1977 with wt baseline 180 with progressive wt gain referred to pulmonary clinic in [place]  06/29/2021 by DR [facility] for doe.  Last time wt was down 238 2022 slowed by knees and progressive wt gain since then    History of Present Illness  06/29/2021  Pulmonary/ 1st office eval/ Kim Wilkerson / [facility]  Chief Complaint  Patient presents with   Consult    Ref by Dr. Donnie Wilkerson for DOE for the last 6 months. Worse with steps.  Dyspnea:  mailbox to house 50 ft flat / walks with cane  Cough: some choking at night and bad HB/ freq throat clearing  Sleep: cpap x 2 weeks  per Kim Wilkerson per [place] Dr Kim Wilkerson / only doing 2 hours initially and building up to 6h  SABA use: none Rec In addition to the blood work you have planned next week you need TSH and BNP with chest xray and I will be able to see the results in our computer Prevacid 30 mg  Take  30-60 min before first meal of the day and Pepcid (famotidine)  40 mg after supper until return to office - this is the best way to tell whether stomach acid is contributing to your problem.  GERD diet reviewed, bed blocks rec   Please schedule a follow up visit in 3 months but call sooner if needed - PFTs  > not done as of 09/25/2021    09/25/2021  f/u ov/Kim Wilkerson office/Kim Wilkerson re: doe maint on gerd  rx though confused with names of meds  Chief Complaint  Patient presents with   Follow-up    Breathing doing better  Echo done on 09/16/21   Dyspnea:  limited by knees / still does krogers leaning on basket  stops every aisle  Cough: none  Sleeping: bed is flat / 3-4 pillows  SABA use: none  02: none      No obvious day to day or daytime variability or assoc excess/ purulent sputum or mucus plugs or hemoptysis or cp or chest tightness, subjective wheeze or overt sinus or hb symptoms.   Sleeping better  without nocturnal  or  early am exacerbation  of respiratory  c/o's or need for noct saba. Also denies any obvious fluctuation of symptoms with weather or environmental changes or other aggravating or alleviating factors except as outlined above   No unusual exposure hx or h/o childhood pna/ asthma or knowledge of premature birth.  Current Allergies, Complete Past Medical History, Past Surgical History, Family History, and Social History were reviewed in [employer] record.  ROS  The following are not active complaints unless bolded Hoarseness, sore throat, dysphagia, dental problems, itching, sneezing,  nasal congestion or discharge of excess mucus or purulent secretions, ear ache,   fever, chills, sweats, unintended wt loss or wt gain, classically pleuritic or exertional cp,  orthopnea pnd or arm/hand swelling  or leg swelling, presyncope, palpitations, abdominal pain, anorexia, nausea, vomiting, diarrhea  or change in bowel habits or change in bladder habits, change in stools or change in urine, dysuria, hematuria,  rash, arthralgias, visual complaints, headache, numbness, weakness or ataxia or problems with walking or coordination,  change in mood or  memory.        Current Meds - - NOTE:   Unable to verify as accurately reflecting what pt takes   (  note says taking 3 different ppi's  Medication Sig   acetaminophen (TYLENOL) 650 MG CR tablet Take 650 mg by mouth every 8 (eight) hours as needed for pain.   Ascorbic Acid 53 MG LOZG Take by mouth.   atorvastatin (LIPITOR) 40 MG tablet TAKE 1 TABLET (40 MG TOTAL) BY MOUTH DAILY.   cyclobenzaprine (FLEXERIL) 5 MG tablet Take 1 tablet (5 mg total) by mouth 3 (three) times daily as needed for muscle spasms.   diclofenac Sodium (VOLTAREN) 1 % GEL Apply 4 g topically 4 (four) times daily. As needed for joint pain   diltiazem (CARDIZEM CD) 240 MG 24 hr capsule TAKE 1 CAPSULE EVERY DAY   famotidine (PEPCID) 40 MG tablet TAKE 1 TABLET TWICE DAILY    furosemide (LASIX) 40 MG tablet TAKE 1 TABLET EVERY DAY   lansoprazole (PREVACID) 30 MG capsule TAKE 1 CAPSULE EVERY DAY AT 12 NOON.   losartan (COZAAR) 50 MG tablet TAKE 1 TABLET EVERY DAY   omeprazole (PRILOSEC) 20 MG capsule Take 1 capsule by mouth daily.   pantoprazole (PROTONIX) 40 MG tablet Take 1 tablet by mouth daily.   traMADol (ULTRAM) 50 MG tablet Take 1 tablet (50 mg total) by mouth 3 (three) times daily as needed.             Past Medical History:  Diagnosis Date   Arthritis    Functional heart murmur 2019   Hypertension 1980   Leg ulcer ([place])    Morbid obesity ([place])    Varicose veins        Objective:       Wt Readings from Last 3 Encounters:  09/25/21 290 lb 9.6 oz (131.8 kg)  09/23/21 289 lb 12.8 oz (131.5 kg)  06/29/21 297 lb (134.7 kg)      Vital signs reviewed  09/25/2021  - Note at rest 02 sats  98% on RA   General appearance:    Amb obese bf walks with cane      HEENT : Oropharynx clear/ top dentures   Nasal turbintes nl    NECK :  without  appent JVD/ palpable Nodes/TM    LUNGS: no acc muscle use,  Nl contour chest which is clear to A and P bilaterally without cough on insp or exp maneuvers   CV:  RRR  no s3 or murmur or increase in P2, and  trace pitting edema both LE ? Lymphedema also ?  ABD: obese  soft and nontender with nl inspiratory excursion in the supine position. No bruits or organomegaly appreciated   MS:  slow awkward gait with cane/ ext warm without deformities Or obvious joint restrictions  calf tenderness, cyanosis or clubbing     SKIN: warm and dry without lesions    NEURO:  alert, approp, nl sensorium with  no motor or cerebellar deficits apparent.        I personally reviewed images and agree with radiology impression as follows:  CXR:   07/08/21 No active cardiopulmonary disease.      Assessment

## 2021-09-25 NOTE — Telephone Encounter (Signed)
Pt aware - has appt 5/30 with DR GOTT

## 2021-09-25 NOTE — Telephone Encounter (Signed)
Kim Wilkerson is only covered for patients with type 2 DM. I did speak to clinical pharmacist and she agreed with me. I could still put it through and a PA will be completed that will take about 3 weeks. Please Have patient follow up with PCP for basic labs, chronic disease management and weight loss, I had seen patient on acute day and weight loss was mentioned, I told patient I would check for what is available but it was not possible to achieve her request in the visit time alloted.   Thanks

## 2021-09-26 ENCOUNTER — Encounter: Payer: Self-pay | Admitting: Internal Medicine

## 2021-09-26 NOTE — Assessment & Plan Note (Addendum)
Body mass index is 44.19 kg/m.  No change from prior ov Lab Results  Component Value Date   TSH 1.940 08/28/2019      Contributing to doe and risk of GERD> rx reviewed - see avs for instructions unique to this ov   >>>   reviewed the need and the process to achieve and maintain neg calorie balance > defer f/u primary care including intermittently monitoring thyroid status

## 2021-09-26 NOTE — Assessment & Plan Note (Signed)
Onset 2022 with wt gain from baseline of 238 to 297 as of 06/29/2021  - Echo 06/09/17  G 1 diastolic dysfunction wih mild MR/TR and PAS 35  - 06/29/2021   Walked on RA  approx 100  ft  @ slow pace, stopped due to sob  with lowest 02 sats 97%  - Echo 09/16/21   1. Left ventricular ejection fraction, by estimation, is 50 to 55%. The left ventricle has low normal function. The left ventricle has no regional wall motion abnormalities. There is mild left ventricular hypertrophy. Left ventricular diastolic parameters are indeterminate.  2. Right ventricular systolic function is normal. The right ventricular size is normal. There is normal pulmonary artery systolic pressure.  3. Left atrial size was mildly dilated.  4. The mitral valve is normal in structure. Mild mitral valve regurgitation.  5. The aortic valve is tricuspid. . Aortic valve sclerosis/calcification is present, without any evidence of aortic stenosis.  6. The inferior vena cava is normal in size with greater than 50% respiratory variability, suggesting right atrial pressure of 3 mmHg. - 09/25/2021   Walked on RA  x  2  lap(s) =  approx 300  ft  @mod /cane pace, stopped due to knees hurt s sob  with lowest 02 sats 93%  Does not appear to be limited by breathing but clearly has issues with wt gain and diastolic dysfunction as evidenced by LAE though this component is relatively small and knees stopped her before doe today   >>> f/u pfts planned and if only show obesity effects then f/u here can be prn   Each maintenance medication was reviewed in detail including emphasizing most importantly the difference between maintenance and prns and under what circumstances the prns are to be triggered using an action plan format where appropriate.  Total time for H and P, chart review, counseling,  directly observing portions of ambulatory 02 saturation study/ and generating customized AVS unique to this office visit / same day charting = 34 min final  summary ov

## 2021-09-28 DIAGNOSIS — E669 Obesity, unspecified: Secondary | ICD-10-CM | POA: Diagnosis not present

## 2021-09-28 DIAGNOSIS — G4733 Obstructive sleep apnea (adult) (pediatric): Secondary | ICD-10-CM | POA: Diagnosis not present

## 2021-09-28 DIAGNOSIS — M129 Arthropathy, unspecified: Secondary | ICD-10-CM | POA: Diagnosis not present

## 2021-09-28 DIAGNOSIS — R2689 Other abnormalities of gait and mobility: Secondary | ICD-10-CM | POA: Diagnosis not present

## 2021-10-01 DIAGNOSIS — G4733 Obstructive sleep apnea (adult) (pediatric): Secondary | ICD-10-CM | POA: Diagnosis not present

## 2021-10-06 ENCOUNTER — Ambulatory Visit (INDEPENDENT_AMBULATORY_CARE_PROVIDER_SITE_OTHER): Payer: Medicare PPO | Admitting: Family Medicine

## 2021-10-06 ENCOUNTER — Encounter: Payer: Self-pay | Admitting: Family Medicine

## 2021-10-06 VITALS — BP 148/80 | HR 70 | Temp 98.1°F | Ht 68.0 in | Wt 290.8 lb

## 2021-10-06 DIAGNOSIS — M25562 Pain in left knee: Secondary | ICD-10-CM

## 2021-10-06 DIAGNOSIS — I10 Essential (primary) hypertension: Secondary | ICD-10-CM | POA: Diagnosis not present

## 2021-10-06 DIAGNOSIS — K219 Gastro-esophageal reflux disease without esophagitis: Secondary | ICD-10-CM | POA: Diagnosis not present

## 2021-10-06 DIAGNOSIS — G8929 Other chronic pain: Secondary | ICD-10-CM | POA: Diagnosis not present

## 2021-10-06 MED ORDER — IBUPROFEN 800 MG PO TABS
400.0000 mg | ORAL_TABLET | Freq: Two times a day (BID) | ORAL | 0 refills | Status: AC | PRN
Start: 2021-10-06 — End: 2021-11-05

## 2021-10-06 MED ORDER — IBUPROFEN 800 MG PO TABS: 400.0000 mg | ORAL_TABLET | Freq: Two times a day (BID) | ORAL | 1 refills | Status: DC | PRN

## 2021-10-06 NOTE — Progress Notes (Signed)
Subjective: CC: f/u BP, weight loss PCP: Raliegh Ip, DO ZOX:WRUE Kim Wilkerson is a 76 y.o. female presenting to clinic today for:  1. Morbid obesity/ HTN Complicated by HTN, chronic knee pain.  She reports difficulty losing weight but admits that she really does not anything special regarding her diet.  She often will eat some type of fried meat for supper and do some type of fried potatoes with onions and then a vegetable.  Sometimes the vegetable consists of cabbage and/or broccoli, which she really likes.  Does not really eat a breakfast or lunch typically.  She has very little mobility secondary to chronic knee pain.  In fact she does not feel like the tramadol really is helping and she would like to go ahead and try going back onto the ibuprofen.  She states that she utilized a Norco of her daughter's.  She has had multiple corticosteroid injections and is probably going to need viscosupplementation.  This is yet to be set up.  She has not tried weight watchers or Nutrisystem.  She is considering seeing a weight loss clinic in [place] that apparently can administer Mounjaro weekly for $50 a pop.  Wants to know what I think about this.  She was told that she was on "too many blood pressure medications by her pulmonologist".  Therefore she discontinued the losartan and has only been taking diltiazem.  Does not report any chest pain, shortness of breath, dizziness or edema.  No visual disturbance.  She was told that she was on "3 acid reducers as well and this was concerning".  She is only prescribed Prevacid and famotidine here.  She is not quite sure which when she is actually taking.     ROS: Per HPI  No Known Allergies Past Medical History:  Diagnosis Date   Arthritis    Functional heart murmur 2019   Hypertension 1980   Leg ulcer (HCC)    Morbid obesity (HCC)    Varicose veins     Current Outpatient Medications:    acetaminophen (TYLENOL) 650 MG CR tablet, Take 650 mg  by mouth every 8 (eight) hours as needed for pain., Disp: , Rfl:    Ascorbic Acid 53 MG LOZG, Take by mouth., Disp: , Rfl:    atorvastatin (LIPITOR) 40 MG tablet, TAKE 1 TABLET (40 MG TOTAL) BY MOUTH DAILY., Disp: 90 tablet, Rfl: 1   cyclobenzaprine (FLEXERIL) 5 MG tablet, Take 1 tablet (5 mg total) by mouth 3 (three) times daily as needed for muscle spasms., Disp: 30 tablet, Rfl: 1   diclofenac Sodium (VOLTAREN) 1 % GEL, Apply 4 g topically 4 (four) times daily. As needed for joint pain, Disp: 400 g, Rfl: 3   diltiazem (CARDIZEM CD) 240 MG 24 hr capsule, TAKE 1 CAPSULE EVERY DAY, Disp: 90 capsule, Rfl: 1   famotidine (PEPCID) 40 MG tablet, TAKE 1 TABLET TWICE DAILY, Disp: 180 tablet, Rfl: 1   furosemide (LASIX) 40 MG tablet, TAKE 1 TABLET EVERY DAY, Disp: 90 tablet, Rfl: 1   lansoprazole (PREVACID) 30 MG capsule, TAKE 1 CAPSULE EVERY DAY AT 12 NOON., Disp: 90 capsule, Rfl: 1   losartan (COZAAR) 50 MG tablet, TAKE 1 TABLET EVERY DAY, Disp: 90 tablet, Rfl: 1   omeprazole (PRILOSEC) 20 MG capsule, Take 1 capsule by mouth daily., Disp: , Rfl:    pantoprazole (PROTONIX) 40 MG tablet, Take 1 tablet by mouth daily., Disp: , Rfl:    traMADol (ULTRAM) 50 MG tablet, Take 1  tablet (50 mg total) by mouth 3 (three) times daily as needed., Disp: 30 tablet, Rfl: 0 Social History   Socioeconomic History   Marital status: Widowed    Spouse name: Not on file   Number of children: 3   Years of education: 12   Highest education level: High school graduate  Occupational History   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  Tobacco Use   Smoking status: Former    Types: Cigarettes    Quit date: 05/11/1975    Years since quitting: 46.4   Smokeless tobacco: Never  Vaping Use   Vaping Use: Never used  Substance and Sexual Activity   Alcohol use: Yes    Alcohol/week: 0.0 standard drinks    Comment: 1 glass of wine every 2-3 weeks   Drug use: No   Sexual activity: Not Currently    Birth  control/protection: Surgical  Other Topics Concern   Not on file  Social History Narrative   Not on file   Social Determinants of Health   Financial Resource Strain: Not on file  Food Insecurity: Not on file  Transportation Needs: Not on file  Physical Activity: Not on file  Stress: Not on file  Social Connections: Not on file  Intimate Partner Violence: Not on file   Family History  Problem Relation Age of Onset   Hypertension Mother    Stroke Mother        after 53   Hyperlipidemia Mother    Thyroid disease Mother    Hypertension Father    Cirrhosis Father    Alcohol abuse Father    Heart attack Sister 31   Hypertension Brother    Ovarian cancer Neg Hx    Breast cancer Neg Hx    Prostate cancer Neg Hx    Colon cancer Neg Hx     Objective: Office vital signs reviewed. BP (!) 148/80   Pulse 70   Temp 98.1 F (36.7 C)   Ht  (1.727 m)   Wt 290 lb 12.8 oz (131.9 kg)   SpO2 95%   BMI 44.22 kg/m   Physical Examination:  General: Awake, alert, morbidly obese, No acute distress HEENT: Sclera white. Cardio: regular rate and rhythm, S1S2 heard, no murmurs appreciated Pulm: clear to auscultation bilaterally, no wheezes, rhonchi or rales; normal work of breathing on room air MSK: Antalgic gait and station.  Ambulating with use of cane  Assessment/ Plan: 76 y.o. female   Chronic pain of left knee - Plan: ibuprofen (ADVIL) 800 MG tablet  Essential hypertension, benign  Morbid obesity (HCC)  Gastroesophageal reflux disease without esophagitis  Ongoing bilateral knee pain left greater than right.  I have sent in ibuprofen.  No apparent contraindication to use except for possible exacerbation of GERD and blood pressure.  We discussed that this may exacerbate both blood pressure and GERD.  Caution.  Take with food and plenty of water.  Watch blood pressure. Discussed goal <150/90.  Recheck blood pressure today was better.  Anticipate that with introduction of  NSAID blood pressure may continue to rise so advised her to resume use of her losartan  For her morbid obesity we had a frank conversation about nutrition today.  We discussed portion sizes, reduction of carbs and fried foods.  Fortunately her mobility is limited secondary to chronic knee pain but she is not going to lose weight without at least some exercise and modification of diet.  From a medical standpoint there is  no contraindication to use of GLP or GIP but we did discuss that Greggory Keen is not indicated in weight loss at this time and only indicated in type 2 diabetes.  Wegovy might be a good option for her.  I also discussed that I would be glad to write a prescription for weight watchers or Nutrisystem if her insurance will cover.  She will look into these options  I contacted her pharmacy personally and was told that there is nothing on file except for her famotidine and 1 soap resolved.  I am not sure as to how she has other medications on hand unless these were old bottles of medications but she certainly is not being prescribed more than 1 PPI and the H1 blocker.  Reiterated which medicine she should be on a handout was provided today  No orders of the defined types were placed in this encounter.  No orders of the defined types were placed in this encounter.    Raliegh Ip, DO [facility] [phone]

## 2021-10-06 NOTE — Patient Instructions (Signed)
Ibuprofen sent for pain. I called your pharmacy (mail order). You should ONLY be taking Famotidine and Lansoprazole for acid reflux You ARE supposed to be on diltiazem AND losartan for blood pressure. We talked about Reginal LutesWegovy as an indicated medication for weight loss. If the PA in [place] can administer this, I think it's ok for you to take.  Unfortunately, Greggory KeenMounjaro is NOT indicated in weight loss yet.  But it is being reviewed by the FDA for 2024. Calorie Counting for Weight Loss Calories are units of energy. Your body needs a certain number of calories from food to keep going throughout the day. When you eat or drink more calories than your body needs, your body stores the extra calories mostly as fat. When you eat or drink fewer calories than your body needs, your body burns fat to get the energy it needs. Calorie counting means keeping track of how many calories you eat and drink each day. Calorie counting can be helpful if you need to lose weight. If you eat fewer calories than your body needs, you should lose weight. Ask your health care provider what a healthy weight is for you. For calorie counting to work, you will need to eat the right number of calories each day to lose a healthy amount of weight per week. A dietitian can help you figure out how many calories you need in a day and will suggest ways to reach your calorie goal. A healthy amount of weight to lose each week is usually 1-2 lb (0.5-0.9 kg). This usually means that your daily calorie intake should be reduced by 500-750 calories. Eating 1,200-1,500 calories a day can help most women lose weight. Eating 1,500-1,800 calories a day can help most men lose weight. What do I need to know about calorie counting? Work with your health care provider or dietitian to determine how many calories you should get each day. To meet your daily calorie goal, you will need to: Find out how many calories are in each food that you would like to eat. Try  to do this before you eat. Decide how much of the food you plan to eat. Keep a food log. Do this by writing down what you ate and how many calories it had. To successfully lose weight, it is important to balance calorie counting with a healthy lifestyle that includes regular activity. Where do I find calorie information?  The number of calories in a food can be found on a Nutrition Facts label. If a food does not have a Nutrition Facts label, try to look up the calories online or ask your dietitian for help. Remember that calories are listed per serving. If you choose to have more than one serving of a food, you will have to multiply the calories per serving by the number of servings you plan to eat. For example, the label on a package of bread might say that a serving size is 1 slice and that there are 90 calories in a serving. If you eat 1 slice, you will have eaten 90 calories. If you eat 2 slices, you will have eaten 180 calories. How do I keep a food log? After each time that you eat, record the following in your food log as soon as possible: What you ate. Be sure to include toppings, sauces, and other extras on the food. How much you ate. This can be measured in cups, ounces, or number of items. How many calories were in each food and  drink. The total number of calories in the food you ate. Keep your food log near you, such as in a pocket-sized notebook or on an app or website on your mobile phone. Some programs will calculate calories for you and show you how many calories you have left to meet your daily goal. What are some portion-control tips? Know how many calories are in a serving. This will help you know how many servings you can have of a certain food. Use a measuring cup to measure serving sizes. You could also try weighing out portions on a kitchen scale. With time, you will be able to estimate serving sizes for some foods. Take time to put servings of different foods on your  favorite plates or in your favorite bowls and cups so you know what a serving looks like. Try not to eat straight from a food's packaging, such as from a bag or box. Eating straight from the package makes it hard to see how much you are eating and can lead to overeating. Put the amount you would like to eat in a cup or on a plate to make sure you are eating the right portion. Use smaller plates, glasses, and bowls for smaller portions and to prevent overeating. Try not to multitask. For example, avoid watching TV or using your computer while eating. If it is time to eat, sit down at a table and enjoy your food. This will help you recognize when you are full. It will also help you be more mindful of what and how much you are eating. What are tips for following this plan? Reading food labels Check the calorie count compared with the serving size. The serving size may be smaller than what you are used to eating. Check the source of the calories. Try to choose foods that are high in protein, fiber, and vitamins, and low in saturated fat, trans fat, and sodium. Shopping Read nutrition labels while you shop. This will help you make healthy decisions about which foods to buy. Pay attention to nutrition labels for low-fat or fat-free foods. These foods sometimes have the same number of calories or more calories than the full-fat versions. They also often have added sugar, starch, or salt to make up for flavor that was removed with the fat. Make a grocery list of lower-calorie foods and stick to it. Cooking Try to cook your favorite foods in a healthier way. For example, try baking instead of frying. Use low-fat dairy products. Meal planning Use more fruits and vegetables. One-half of your plate should be fruits and vegetables. Include lean proteins, such as chicken, [place], and fish. Lifestyle Each week, aim to do one of the following: 150 minutes of moderate exercise, such as walking. 75 minutes of  vigorous exercise, such as running. General information Know how many calories are in the foods you eat most often. This will help you calculate calorie counts faster. Find a way of tracking calories that works for you. Get creative. Try different apps or programs if writing down calories does not work for you. What foods should I eat?  Eat nutritious foods. It is better to have a nutritious, high-calorie food, such as an avocado, than a food with few nutrients, such as a bag of potato chips. Use your calories on foods and drinks that will fill you up and will not leave you hungry soon after eating. Examples of foods that fill you up are nuts and nut butters, vegetables, lean proteins, and high-fiber  foods such as whole grains. High-fiber foods are foods with more than 5 g of fiber per serving. Pay attention to calories in drinks. Low-calorie drinks include water and unsweetened drinks. The items listed above may not be a complete list of foods and beverages you can eat. Contact a dietitian for more information. What foods should I limit? Limit foods or drinks that are not good sources of vitamins, minerals, or protein or that are high in unhealthy fats. These include: Candy. Other sweets. Sodas, specialty coffee drinks, alcohol, and juice. The items listed above may not be a complete list of foods and beverages you should avoid. Contact a dietitian for more information. How do I count calories when eating out? Pay attention to portions. Often, portions are much larger when eating out. Try these tips to keep portions smaller: Consider sharing a meal instead of getting your own. If you get your own meal, eat only half of it. Before you start eating, ask for a container and put half of your meal into it. When available, consider ordering smaller portions from the menu instead of full portions. Pay attention to your food and drink choices. Knowing the way food is cooked and what is included with  the meal can help you eat fewer calories. If calories are listed on the menu, choose the lower-calorie options. Choose dishes that include vegetables, fruits, whole grains, low-fat dairy products, and lean proteins. Choose items that are boiled, broiled, grilled, or steamed. Avoid items that are buttered, battered, fried, or served with cream sauce. Items labeled as crispy are usually fried, unless stated otherwise. Choose water, low-fat milk, unsweetened iced tea, or other drinks without added sugar. If you want an alcoholic beverage, choose a lower-calorie option, such as a glass of wine or light beer. Ask for dressings, sauces, and syrups on the side. These are usually high in calories, so you should limit the amount you eat. If you want a salad, choose a garden salad and ask for grilled meats. Avoid extra toppings such as bacon, cheese, or fried items. Ask for the dressing on the side, or ask for olive oil and vinegar or lemon to use as dressing. Estimate how many servings of a food you are given. Knowing serving sizes will help you be aware of how much food you are eating at restaurants. Where to find more information Centers for Disease Control and Prevention: FootballExhibition.com.br U.S. Department of Agriculture: WrestlingReporter.dk Summary Calorie counting means keeping track of how many calories you eat and drink each day. If you eat fewer calories than your body needs, you should lose weight. A healthy amount of weight to lose per week is usually 1-2 lb (0.5-0.9 kg). This usually means reducing your daily calorie intake by 500-750 calories. The number of calories in a food can be found on a Nutrition Facts label. If a food does not have a Nutrition Facts label, try to look up the calories online or ask your dietitian for help. Use smaller plates, glasses, and bowls for smaller portions and to prevent overeating. Use your calories on foods and drinks that will fill you up and not leave you hungry shortly after  a meal. This information is not intended to replace advice given to you by your health care provider. Make sure you discuss any questions you have with your health care provider. Document Revised: 06/07/2019 Document Reviewed: 06/07/2019 Elsevier Patient Education  2023 [employer].

## 2021-10-13 DIAGNOSIS — G4733 Obstructive sleep apnea (adult) (pediatric): Secondary | ICD-10-CM | POA: Diagnosis not present

## 2021-10-20 ENCOUNTER — Other Ambulatory Visit: Payer: Self-pay | Admitting: Family Medicine

## 2021-10-20 DIAGNOSIS — Z1231 Encounter for screening mammogram for malignant neoplasm of breast: Secondary | ICD-10-CM

## 2021-10-21 DIAGNOSIS — M2351 Chronic instability of knee, right knee: Secondary | ICD-10-CM | POA: Diagnosis not present

## 2021-10-21 DIAGNOSIS — G894 Chronic pain syndrome: Secondary | ICD-10-CM | POA: Diagnosis not present

## 2021-10-21 DIAGNOSIS — M170 Bilateral primary osteoarthritis of knee: Secondary | ICD-10-CM | POA: Diagnosis not present

## 2021-10-21 DIAGNOSIS — R296 Repeated falls: Secondary | ICD-10-CM | POA: Diagnosis not present

## 2021-10-21 DIAGNOSIS — R2681 Unsteadiness on feet: Secondary | ICD-10-CM | POA: Diagnosis not present

## 2021-10-27 ENCOUNTER — Ambulatory Visit (INDEPENDENT_AMBULATORY_CARE_PROVIDER_SITE_OTHER): Payer: Medicare PPO | Admitting: Nurse Practitioner

## 2021-10-27 ENCOUNTER — Encounter: Payer: Self-pay | Admitting: Nurse Practitioner

## 2021-10-27 VITALS — BP 144/72 | HR 67 | Temp 98.9°F | Ht 68.0 in | Wt 290.4 lb

## 2021-10-27 DIAGNOSIS — I872 Venous insufficiency (chronic) (peripheral): Secondary | ICD-10-CM

## 2021-10-27 NOTE — Progress Notes (Signed)
Acute Office Visit  Subjective:     Patient ID: Kim Wilkerson, female    DOB: 06/09/1945, 76 y.o.   MRN: [id number]  Chief Complaint  Patient presents with   Leg Pain    Hard to walk on both legs - states it is hard to stand , or walk at times - this has been going on for about 2 weeks - states it feels like she has weights on them     Leg Pain  The incident occurred more than 1 week ago (4 weeks). The incident occurred at home. There was no injury mechanism. The pain is present in the left leg and right leg. The quality of the pain is described as aching and shooting. The pain is severe. The pain has been Constant since onset. Associated symptoms include an inability to bear weight and muscle weakness. Pertinent negatives include no loss of motion, loss of sensation, numbness or tingling. She reports no foreign bodies present. She has tried nothing for the symptoms.        Review of Systems  Constitutional: Negative.  Negative for chills and fever.  HENT: Negative.    Eyes: Negative.   Respiratory: Negative.    Cardiovascular:  Positive for leg swelling.  Genitourinary: Negative.   Musculoskeletal:  Positive for joint pain and myalgias.  Skin: Negative.  Negative for rash.  Neurological:  Negative for tingling and numbness.  All other systems reviewed and are negative.       Objective:    BP (!) 144/72   Pulse 67   Temp 98.9 F (37.2 C)   Ht 5\' 8"  (1.727 m)   Wt 290 lb 6.4 oz (131.7 kg)   SpO2 98%   BMI 44.16 kg/m  BP Readings from Last 3 Encounters:  10/27/21 (!) 144/72  10/06/21 (!) 148/80  09/25/21 138/90   Wt Readings from Last 3 Encounters:  10/27/21 290 lb 6.4 oz (131.7 kg)  10/06/21 290 lb 12.8 oz (131.9 kg)  09/25/21 290 lb 9.6 oz (131.8 kg)      Physical Exam Vitals and nursing note reviewed.  Constitutional:      Appearance: Normal appearance. She is obese.  HENT:     Head: Normocephalic.     Right Ear: External ear normal.     Left Ear:  External ear normal.     Nose: Nose normal.  Eyes:     Conjunctiva/sclera: Conjunctivae normal.  Cardiovascular:     Rate and Rhythm: Normal rate and regular rhythm.     Pulses: Normal pulses.  Pulmonary:     Effort: Pulmonary effort is normal.     Breath sounds: Normal breath sounds.  Abdominal:     General: Bowel sounds are normal.  Musculoskeletal:     Right foot: Decreased range of motion. Tenderness present.     Left foot: Decreased range of motion. Tenderness present.       Legs:     Comments: Bilateral lower leg pain and discomfort  Neurological:     General: No focal deficit present.     Mental Status: She is alert and oriented to person, place, and time.  Psychiatric:        Behavior: Behavior normal.     No results found for any visits on 10/27/21.      Assessment & Plan:  Patients symptoms presents as stasis dermatitis. Bilateral lower leg pain not well controlled, symptoms present in the past 2-3 months.  -Discussed patient's weight and  stress on joints. Discussed the need to increase mobility and exeresis and weight loss  - DME orders completed  for compression socks and to  Elevate extremities -Continue Lasix as prescribed. [place] orders completed to rule out blood clots  - warm compress and or tylenol as needed for pain and discomfort as tolerated  Problem List Items Addressed This Visit   None Visit Diagnoses     Stasis dermatitis of both legs    -  Primary   Relevant Orders   [place] Lower Ext Art Bilat   For home use only DME Other see comment       No orders of the defined types were placed in this encounter.   Return if symptoms worsen or fail to improve.  Daryll Drown, NP

## 2021-10-27 NOTE — Patient Instructions (Signed)
Stasis Dermatitis Stasis dermatitis is a long-term (chronic) skin condition that happens when veins can no longer pump blood back to the heart (poor circulation). This condition causes a red or brown scaly rash or sores (ulcers) from the pooling of blood (stasis). This condition usually affects the lower legs. It may affect one leg or both legs. Without treatment, severe stasis dermatitis can lead to other skin conditions and infections. What are the causes? This condition is caused by poor circulation. What increases the risk? You are more likely to develop this condition if: You are not very active. You stand for long periods of time. You have veins that have become enlarged and twisted (varicose veins). You have leg veins that are not strong enough to send blood back to the heart (venous insufficiency). You have had a blood clot. You have been pregnant many times. You have had vein surgery. You are obese. You have heart or kidney failure. You are 50 years of age or older. You have had injuries to your legs in the past. What are the signs or symptoms? Common early symptoms of this condition include: Itchiness in one or both of your legs. Swelling in your ankle or leg. This might get better overnight but be worse again during the day. Skin that looks thin on your ankle and leg. Red or brown marks that develop slowly. Skin that is dry, cracked, or easily irritated. Red, swollen skin that is sore or has a burning feeling. An achy or heavy feeling after you walk or stand for long periods of time. Pain. Later and more severe symptoms of this condition include: Skin that looks shiny. Small, open sores (ulcers). These are often red or purple and leak fluid. Skin that feels hard. Severe itching. A change in the shape or color of your lower legs. Severe pain. Difficulty walking. How is this diagnosed? This condition may be diagnosed based on: Your symptoms and medical history. A  physical exam. You may also have tests, including: Blood tests. Imaging tests to check blood flow (Doppler ultrasound). Allergy tests. You may need to see a health care provider who specializes in skin diseases (dermatologist). How is this treated? This condition may be treated with: Compression stockings or an elastic wrap to improve circulation. Medicines, such as: Corticosteroid creams and ointments. Non-corticosteroid medicines applied to the skin (topical). Medicine to reduce swelling in the legs (diuretics). Antibiotics. Medicine to relieve itching (antihistamines). A bandage (dressing). A wrap that contains zinc and gelatin (Unna boot). Follow these instructions at home: Skin care Moisturize your skin as told by your health care provider. Do not use moisturizers with fragrance. This can irritate your skin. Apply a cool, wet cloth (cool compress) to the affected areas. Do not scratch your skin. Do not rub your skin dry after a bath or shower. Gently pat your skin dry. Do not use scented soaps, detergents, or perfumes. Medicines Take or use over-the-counter and prescription medicines only as told by your health care provider. If you were prescribed an antibiotic medicine, take or use it as told by your health care provider. Do not stop taking or using the antibiotic even if your condition improves. Activity Walk as told by your health care provider. Walking increases blood flow. Do calf and ankle exercises throughout the day as told by your health care provider. This will help increase blood flow. Raise (elevate) your legs above the level of your heart when you are sitting or lying down. Lifestyle Work with your health   care provider to lose weight, if needed. Do not cross your legs when you sit. Do not stand or sit in one position for long periods of time. Wear comfortable, loose-fitting clothing. Circulation in your legs will be worse if you wear tight pants, belts, and  waistbands. Do not use any products that contain nicotine or tobacco, such as cigarettes, e-cigarettes, and chewing tobacco. If you need help quitting, ask your health care provider. General instructions If you were asked to use one of the following to help with your condition, follow instructions from your health care provider on how to: Remove and change any dressing. Wear compression stockings. These stockings help to prevent blood clots and reduce swelling in your legs. Wear the Unna boot. Keep all follow-up visits as told by your health care provider. This is important. Contact a health care provider if: Your condition does not improve with treatment. Your condition gets worse. You have signs of infection in the affected area. Watch for: Swelling. Tenderness. Redness. Soreness. Warmth. You have a fever. Get help right away if: You notice red streaks coming from the affected area. Your bone or joint underneath the affected area becomes painful after the skin has healed. The affected area turns darker. You feel a deep pain in your leg or groin. You are short of breath. Summary Stasis dermatitis is a long-term (chronic) skin condition that happens when veins can no longer pump blood back to the heart (poor circulation). Wear compression stockings as told by your health care provider. These stockings help to prevent blood clots and reduce swelling in your legs. Follow instructions from your health care provider about activity, medicines, and lifestyle. Contact a health care provider if you have a fever or have signs of infection in the affected area. Keep all follow-up visits as told by your health care provider. This is important. This information is not intended to replace advice given to you by your health care provider. Make sure you discuss any questions you have with your health care provider. Document Revised: 07/07/2020 Document Reviewed: 07/07/2020 Elsevier Patient Education   2023 Elsevier Inc.  

## 2021-10-30 DIAGNOSIS — R2689 Other abnormalities of gait and mobility: Secondary | ICD-10-CM | POA: Diagnosis not present

## 2021-10-30 DIAGNOSIS — Z9181 History of falling: Secondary | ICD-10-CM | POA: Diagnosis not present

## 2021-10-30 DIAGNOSIS — R531 Weakness: Secondary | ICD-10-CM | POA: Diagnosis not present

## 2021-10-30 DIAGNOSIS — R2681 Unsteadiness on feet: Secondary | ICD-10-CM | POA: Diagnosis not present

## 2021-10-30 DIAGNOSIS — M2351 Chronic instability of knee, right knee: Secondary | ICD-10-CM | POA: Diagnosis not present

## 2021-10-30 DIAGNOSIS — R296 Repeated falls: Secondary | ICD-10-CM | POA: Diagnosis not present

## 2021-10-30 DIAGNOSIS — M170 Bilateral primary osteoarthritis of knee: Secondary | ICD-10-CM | POA: Diagnosis not present

## 2021-11-06 ENCOUNTER — Ambulatory Visit (HOSPITAL_COMMUNITY)
Admission: RE | Admit: 2021-11-06 | Discharge: 2021-11-06 | Disposition: A | Discharge status: Home / Self Care | Payer: Medicare PPO | Source: Ambulatory Visit | Attending: Nurse Practitioner | Admitting: Nurse Practitioner

## 2021-11-06 DIAGNOSIS — M79605 Pain in left leg: Secondary | ICD-10-CM | POA: Diagnosis not present

## 2021-11-06 DIAGNOSIS — M7121 Synovial cyst of popliteal space [Baker], right knee: Secondary | ICD-10-CM | POA: Diagnosis not present

## 2021-11-06 DIAGNOSIS — I872 Venous insufficiency (chronic) (peripheral): Secondary | ICD-10-CM | POA: Insufficient documentation

## 2021-11-06 DIAGNOSIS — R600 Localized edema: Secondary | ICD-10-CM | POA: Diagnosis not present

## 2021-11-10 ENCOUNTER — Other Ambulatory Visit: Payer: Self-pay | Admitting: Nurse Practitioner

## 2021-11-10 DIAGNOSIS — G8929 Other chronic pain: Secondary | ICD-10-CM

## 2021-11-10 MED ORDER — IBUPROFEN 800 MG PO TABS: 400.0000 mg | ORAL_TABLET | Freq: Two times a day (BID) | ORAL | 1 refills | Status: DC | PRN

## 2021-11-10 NOTE — Progress Notes (Signed)
I sent Ibuprofen to the pharmacy

## 2021-11-12 DIAGNOSIS — G4733 Obstructive sleep apnea (adult) (pediatric): Secondary | ICD-10-CM | POA: Diagnosis not present

## 2021-12-02 DIAGNOSIS — G894 Chronic pain syndrome: Secondary | ICD-10-CM | POA: Diagnosis not present

## 2021-12-02 DIAGNOSIS — M7122 Synovial cyst of popliteal space [Baker], left knee: Secondary | ICD-10-CM | POA: Diagnosis not present

## 2021-12-02 DIAGNOSIS — R296 Repeated falls: Secondary | ICD-10-CM | POA: Diagnosis not present

## 2021-12-02 DIAGNOSIS — M170 Bilateral primary osteoarthritis of knee: Secondary | ICD-10-CM | POA: Diagnosis not present

## 2021-12-09 ENCOUNTER — Ambulatory Visit: Payer: Medicare PPO | Admitting: Family Medicine

## 2021-12-13 DIAGNOSIS — G4733 Obstructive sleep apnea (adult) (pediatric): Secondary | ICD-10-CM | POA: Diagnosis not present

## 2021-12-14 ENCOUNTER — Inpatient Hospital Stay: Admission: RE | Admit: 2021-12-14 | Payer: Medicare PPO | Source: Ambulatory Visit

## 2021-12-18 ENCOUNTER — Ambulatory Visit: Payer: Medicare PPO | Admitting: Family Medicine

## 2021-12-21 ENCOUNTER — Encounter: Payer: Self-pay | Admitting: Family Medicine

## 2021-12-25 DIAGNOSIS — M7122 Synovial cyst of popliteal space [Baker], left knee: Secondary | ICD-10-CM | POA: Diagnosis not present

## 2021-12-25 DIAGNOSIS — M25562 Pain in left knee: Secondary | ICD-10-CM | POA: Diagnosis not present

## 2021-12-28 DIAGNOSIS — M25569 Pain in unspecified knee: Secondary | ICD-10-CM | POA: Diagnosis not present

## 2021-12-28 DIAGNOSIS — G4733 Obstructive sleep apnea (adult) (pediatric): Secondary | ICD-10-CM | POA: Diagnosis not present

## 2022-01-08 ENCOUNTER — Ambulatory Visit: Payer: Medicare PPO | Admitting: Family Medicine

## 2022-01-13 DIAGNOSIS — G4733 Obstructive sleep apnea (adult) (pediatric): Secondary | ICD-10-CM | POA: Diagnosis not present

## 2022-01-14 DIAGNOSIS — Z79899 Other long term (current) drug therapy: Secondary | ICD-10-CM | POA: Diagnosis not present

## 2022-01-14 DIAGNOSIS — M25562 Pain in left knee: Secondary | ICD-10-CM | POA: Diagnosis not present

## 2022-01-14 DIAGNOSIS — K219 Gastro-esophageal reflux disease without esophagitis: Secondary | ICD-10-CM | POA: Diagnosis not present

## 2022-01-14 DIAGNOSIS — E7800 Pure hypercholesterolemia, unspecified: Secondary | ICD-10-CM | POA: Diagnosis not present

## 2022-01-14 DIAGNOSIS — M25561 Pain in right knee: Secondary | ICD-10-CM | POA: Diagnosis not present

## 2022-01-14 DIAGNOSIS — Z0130 Encounter for examination of blood pressure without abnormal findings: Secondary | ICD-10-CM | POA: Diagnosis not present

## 2022-01-14 DIAGNOSIS — Z6841 Body Mass Index (BMI) 40.0 and over, adult: Secondary | ICD-10-CM | POA: Diagnosis not present

## 2022-01-14 DIAGNOSIS — I10 Essential (primary) hypertension: Secondary | ICD-10-CM | POA: Diagnosis not present

## 2022-01-14 DIAGNOSIS — M129 Arthropathy, unspecified: Secondary | ICD-10-CM | POA: Diagnosis not present

## 2022-01-18 DIAGNOSIS — I10 Essential (primary) hypertension: Secondary | ICD-10-CM | POA: Diagnosis not present

## 2022-01-18 DIAGNOSIS — E7800 Pure hypercholesterolemia, unspecified: Secondary | ICD-10-CM | POA: Diagnosis not present

## 2022-01-18 DIAGNOSIS — M25561 Pain in right knee: Secondary | ICD-10-CM | POA: Diagnosis not present

## 2022-01-18 DIAGNOSIS — F1120 Opioid dependence, uncomplicated: Secondary | ICD-10-CM | POA: Diagnosis not present

## 2022-01-18 DIAGNOSIS — G2581 Restless legs syndrome: Secondary | ICD-10-CM | POA: Diagnosis not present

## 2022-01-18 DIAGNOSIS — Z6841 Body Mass Index (BMI) 40.0 and over, adult: Secondary | ICD-10-CM | POA: Diagnosis not present

## 2022-01-18 DIAGNOSIS — K219 Gastro-esophageal reflux disease without esophagitis: Secondary | ICD-10-CM | POA: Diagnosis not present

## 2022-01-18 DIAGNOSIS — Z0130 Encounter for examination of blood pressure without abnormal findings: Secondary | ICD-10-CM | POA: Diagnosis not present

## 2022-01-18 DIAGNOSIS — M109 Gout, unspecified: Secondary | ICD-10-CM | POA: Diagnosis not present

## 2022-01-22 DIAGNOSIS — R748 Abnormal levels of other serum enzymes: Secondary | ICD-10-CM | POA: Diagnosis not present

## 2022-01-25 DIAGNOSIS — M109 Gout, unspecified: Secondary | ICD-10-CM | POA: Diagnosis not present

## 2022-01-25 DIAGNOSIS — Z0130 Encounter for examination of blood pressure without abnormal findings: Secondary | ICD-10-CM | POA: Diagnosis not present

## 2022-01-25 DIAGNOSIS — I10 Essential (primary) hypertension: Secondary | ICD-10-CM | POA: Diagnosis not present

## 2022-01-25 DIAGNOSIS — E7800 Pure hypercholesterolemia, unspecified: Secondary | ICD-10-CM | POA: Diagnosis not present

## 2022-01-25 DIAGNOSIS — Z6841 Body Mass Index (BMI) 40.0 and over, adult: Secondary | ICD-10-CM | POA: Diagnosis not present

## 2022-01-25 DIAGNOSIS — K219 Gastro-esophageal reflux disease without esophagitis: Secondary | ICD-10-CM | POA: Diagnosis not present

## 2022-01-25 DIAGNOSIS — R609 Edema, unspecified: Secondary | ICD-10-CM | POA: Diagnosis not present

## 2022-01-25 DIAGNOSIS — M549 Dorsalgia, unspecified: Secondary | ICD-10-CM | POA: Diagnosis not present

## 2022-01-25 DIAGNOSIS — G2581 Restless legs syndrome: Secondary | ICD-10-CM | POA: Diagnosis not present

## 2022-02-08 ENCOUNTER — Ambulatory Visit: Payer: Medicare PPO | Admitting: Family Medicine

## 2022-02-12 DIAGNOSIS — G4733 Obstructive sleep apnea (adult) (pediatric): Secondary | ICD-10-CM | POA: Diagnosis not present

## 2022-02-22 DIAGNOSIS — Z6841 Body Mass Index (BMI) 40.0 and over, adult: Secondary | ICD-10-CM | POA: Diagnosis not present

## 2022-02-22 DIAGNOSIS — I10 Essential (primary) hypertension: Secondary | ICD-10-CM | POA: Diagnosis not present

## 2022-02-22 DIAGNOSIS — M109 Gout, unspecified: Secondary | ICD-10-CM | POA: Diagnosis not present

## 2022-02-22 DIAGNOSIS — F1120 Opioid dependence, uncomplicated: Secondary | ICD-10-CM | POA: Diagnosis not present

## 2022-02-22 DIAGNOSIS — M25561 Pain in right knee: Secondary | ICD-10-CM | POA: Diagnosis not present

## 2022-02-22 DIAGNOSIS — G2581 Restless legs syndrome: Secondary | ICD-10-CM | POA: Diagnosis not present

## 2022-02-22 DIAGNOSIS — Z0130 Encounter for examination of blood pressure without abnormal findings: Secondary | ICD-10-CM | POA: Diagnosis not present

## 2022-02-22 DIAGNOSIS — K219 Gastro-esophageal reflux disease without esophagitis: Secondary | ICD-10-CM | POA: Diagnosis not present

## 2022-02-22 DIAGNOSIS — E7800 Pure hypercholesterolemia, unspecified: Secondary | ICD-10-CM | POA: Diagnosis not present

## 2022-03-05 DIAGNOSIS — M170 Bilateral primary osteoarthritis of knee: Secondary | ICD-10-CM | POA: Diagnosis not present

## 2022-03-12 DIAGNOSIS — M170 Bilateral primary osteoarthritis of knee: Secondary | ICD-10-CM | POA: Diagnosis not present

## 2022-03-15 DIAGNOSIS — G4733 Obstructive sleep apnea (adult) (pediatric): Secondary | ICD-10-CM | POA: Diagnosis not present

## 2022-03-23 DIAGNOSIS — K219 Gastro-esophageal reflux disease without esophagitis: Secondary | ICD-10-CM | POA: Diagnosis not present

## 2022-03-23 DIAGNOSIS — Z0130 Encounter for examination of blood pressure without abnormal findings: Secondary | ICD-10-CM | POA: Diagnosis not present

## 2022-03-23 DIAGNOSIS — F1120 Opioid dependence, uncomplicated: Secondary | ICD-10-CM | POA: Diagnosis not present

## 2022-03-23 DIAGNOSIS — I10 Essential (primary) hypertension: Secondary | ICD-10-CM | POA: Diagnosis not present

## 2022-03-23 DIAGNOSIS — G2581 Restless legs syndrome: Secondary | ICD-10-CM | POA: Diagnosis not present

## 2022-03-23 DIAGNOSIS — Z6841 Body Mass Index (BMI) 40.0 and over, adult: Secondary | ICD-10-CM | POA: Diagnosis not present

## 2022-03-23 DIAGNOSIS — E7800 Pure hypercholesterolemia, unspecified: Secondary | ICD-10-CM | POA: Diagnosis not present

## 2022-03-23 DIAGNOSIS — R609 Edema, unspecified: Secondary | ICD-10-CM | POA: Diagnosis not present

## 2022-03-23 DIAGNOSIS — M549 Dorsalgia, unspecified: Secondary | ICD-10-CM | POA: Diagnosis not present

## 2022-04-05 DIAGNOSIS — R2689 Other abnormalities of gait and mobility: Secondary | ICD-10-CM | POA: Diagnosis not present

## 2022-04-05 DIAGNOSIS — G4733 Obstructive sleep apnea (adult) (pediatric): Secondary | ICD-10-CM | POA: Diagnosis not present

## 2022-04-05 DIAGNOSIS — M129 Arthropathy, unspecified: Secondary | ICD-10-CM | POA: Diagnosis not present

## 2022-04-05 DIAGNOSIS — E669 Obesity, unspecified: Secondary | ICD-10-CM | POA: Diagnosis not present

## 2022-04-14 DIAGNOSIS — G4733 Obstructive sleep apnea (adult) (pediatric): Secondary | ICD-10-CM | POA: Diagnosis not present

## 2022-04-22 DIAGNOSIS — Z0130 Encounter for examination of blood pressure without abnormal findings: Secondary | ICD-10-CM | POA: Diagnosis not present

## 2022-04-22 DIAGNOSIS — I10 Essential (primary) hypertension: Secondary | ICD-10-CM | POA: Diagnosis not present

## 2022-04-22 DIAGNOSIS — G2581 Restless legs syndrome: Secondary | ICD-10-CM | POA: Diagnosis not present

## 2022-04-22 DIAGNOSIS — Z79899 Other long term (current) drug therapy: Secondary | ICD-10-CM | POA: Diagnosis not present

## 2022-04-22 DIAGNOSIS — M25561 Pain in right knee: Secondary | ICD-10-CM | POA: Diagnosis not present

## 2022-04-22 DIAGNOSIS — Z6841 Body Mass Index (BMI) 40.0 and over, adult: Secondary | ICD-10-CM | POA: Diagnosis not present

## 2022-04-22 DIAGNOSIS — M109 Gout, unspecified: Secondary | ICD-10-CM | POA: Diagnosis not present

## 2022-04-22 DIAGNOSIS — M549 Dorsalgia, unspecified: Secondary | ICD-10-CM | POA: Diagnosis not present

## 2022-04-22 DIAGNOSIS — M25562 Pain in left knee: Secondary | ICD-10-CM | POA: Diagnosis not present

## 2022-05-15 DIAGNOSIS — G4733 Obstructive sleep apnea (adult) (pediatric): Secondary | ICD-10-CM | POA: Diagnosis not present

## 2022-05-18 ENCOUNTER — Ambulatory Visit: Payer: Medicare PPO | Admitting: Family Medicine

## 2022-05-18 DIAGNOSIS — M549 Dorsalgia, unspecified: Secondary | ICD-10-CM | POA: Diagnosis not present

## 2022-05-18 DIAGNOSIS — Z6841 Body Mass Index (BMI) 40.0 and over, adult: Secondary | ICD-10-CM | POA: Diagnosis not present

## 2022-05-18 DIAGNOSIS — M109 Gout, unspecified: Secondary | ICD-10-CM | POA: Diagnosis not present

## 2022-05-18 DIAGNOSIS — M170 Bilateral primary osteoarthritis of knee: Secondary | ICD-10-CM | POA: Diagnosis not present

## 2022-05-18 DIAGNOSIS — Z0130 Encounter for examination of blood pressure without abnormal findings: Secondary | ICD-10-CM | POA: Diagnosis not present

## 2022-05-18 DIAGNOSIS — M25562 Pain in left knee: Secondary | ICD-10-CM | POA: Diagnosis not present

## 2022-05-18 DIAGNOSIS — M25561 Pain in right knee: Secondary | ICD-10-CM | POA: Diagnosis not present

## 2022-05-18 DIAGNOSIS — G2581 Restless legs syndrome: Secondary | ICD-10-CM | POA: Diagnosis not present

## 2022-05-18 DIAGNOSIS — Z79899 Other long term (current) drug therapy: Secondary | ICD-10-CM | POA: Diagnosis not present

## 2022-05-18 DIAGNOSIS — I10 Essential (primary) hypertension: Secondary | ICD-10-CM | POA: Diagnosis not present

## 2022-05-20 DIAGNOSIS — R2681 Unsteadiness on feet: Secondary | ICD-10-CM | POA: Diagnosis not present

## 2022-05-20 DIAGNOSIS — R296 Repeated falls: Secondary | ICD-10-CM | POA: Diagnosis not present

## 2022-05-20 DIAGNOSIS — N170 Acute kidney failure with tubular necrosis: Secondary | ICD-10-CM | POA: Diagnosis not present

## 2022-05-24 DIAGNOSIS — Z79899 Other long term (current) drug therapy: Secondary | ICD-10-CM | POA: Diagnosis not present

## 2022-05-27 DIAGNOSIS — R634 Abnormal weight loss: Secondary | ICD-10-CM | POA: Diagnosis not present

## 2022-05-27 DIAGNOSIS — E669 Obesity, unspecified: Secondary | ICD-10-CM | POA: Diagnosis not present

## 2022-05-27 DIAGNOSIS — M170 Bilateral primary osteoarthritis of knee: Secondary | ICD-10-CM | POA: Diagnosis not present

## 2022-05-27 DIAGNOSIS — G894 Chronic pain syndrome: Secondary | ICD-10-CM | POA: Diagnosis not present

## 2022-06-01 ENCOUNTER — Telehealth: Payer: Self-pay | Admitting: Family Medicine

## 2022-06-01 DIAGNOSIS — M170 Bilateral primary osteoarthritis of knee: Secondary | ICD-10-CM | POA: Diagnosis not present

## 2022-06-01 NOTE — Telephone Encounter (Signed)
Left message to call back

## 2022-06-09 ENCOUNTER — Other Ambulatory Visit: Payer: Self-pay | Admitting: Family Medicine

## 2022-06-09 DIAGNOSIS — G8929 Other chronic pain: Secondary | ICD-10-CM

## 2022-06-11 NOTE — Telephone Encounter (Signed)
Patient never returned call, encounter closed 

## 2022-06-18 ENCOUNTER — Telehealth: Payer: Self-pay

## 2022-06-18 NOTE — Telephone Encounter (Signed)
error 

## 2022-06-21 ENCOUNTER — Telehealth: Payer: Self-pay | Admitting: Family Medicine

## 2022-06-21 NOTE — Telephone Encounter (Signed)
Left message for patient to call back and schedule Medicare Annual Wellness Visit (AWV) to be completed by video or phone.   Last AWV: 05/08/2021   Please schedule at anytime with [place]     Any questions, please contact me at [phone]   Thank you,   [facility] Ambulatory Clinical Support for [place] [facility] ??[id number] or ??[id number]

## 2022-07-01 ENCOUNTER — Encounter: Payer: Self-pay | Admitting: Family Medicine

## 2022-07-06 ENCOUNTER — Encounter: Payer: Self-pay | Admitting: Family Medicine

## 2022-07-06 ENCOUNTER — Ambulatory Visit (INDEPENDENT_AMBULATORY_CARE_PROVIDER_SITE_OTHER): Payer: Medicare PPO | Admitting: Family Medicine

## 2022-07-06 VITALS — BP 131/79 | HR 85 | Temp 98.8°F | Ht 68.0 in | Wt 278.0 lb

## 2022-07-06 DIAGNOSIS — M25562 Pain in left knee: Secondary | ICD-10-CM

## 2022-07-06 DIAGNOSIS — G8929 Other chronic pain: Secondary | ICD-10-CM

## 2022-07-06 DIAGNOSIS — E785 Hyperlipidemia, unspecified: Secondary | ICD-10-CM

## 2022-07-06 DIAGNOSIS — K219 Gastro-esophageal reflux disease without esophagitis: Secondary | ICD-10-CM

## 2022-07-06 DIAGNOSIS — E1159 Type 2 diabetes mellitus with other circulatory complications: Secondary | ICD-10-CM | POA: Diagnosis not present

## 2022-07-06 DIAGNOSIS — E119 Type 2 diabetes mellitus without complications: Secondary | ICD-10-CM | POA: Insufficient documentation

## 2022-07-06 DIAGNOSIS — M85851 Other specified disorders of bone density and structure, right thigh: Secondary | ICD-10-CM

## 2022-07-06 DIAGNOSIS — I152 Hypertension secondary to endocrine disorders: Secondary | ICD-10-CM

## 2022-07-06 DIAGNOSIS — E1169 Type 2 diabetes mellitus with other specified complication: Secondary | ICD-10-CM

## 2022-07-06 LAB — BAYER DCA HB A1C WAIVED: HB A1C (BAYER DCA - WAIVED): 6.6 % — ABNORMAL HIGH (ref 4.8–5.6)

## 2022-07-06 LAB — LIPID PANEL

## 2022-07-06 MED ORDER — OZEMPIC (0.25 OR 0.5 MG/DOSE) 2 MG/3ML ~~LOC~~ SOPN: 0.5000 mg | PEN_INJECTOR | SUBCUTANEOUS | 3 refills | Status: DC

## 2022-07-06 MED ORDER — METFORMIN HCL 500 MG PO TABS: 500.0000 mg | ORAL_TABLET | Freq: Every day | ORAL | 3 refills | Status: DC

## 2022-07-06 NOTE — Patient Instructions (Signed)

## 2022-07-06 NOTE — Progress Notes (Signed)
Subjective: CC: Dyspnea on exertion PCP: Kim Norlander, DO [id number] Kim Wilkerson is a 77 y.o. female presenting to clinic today for:  1.  Dyspnea on exertion Continues to have dyspnea on exertion and this is attributed to her morbid obesity.  She has had cardiac workup in the past but has not seen a pulmonologist.  No reports of wheezing.  She admits that she continues to have difficulty losing weight because of decreased ability to ambulate.  She is currently under the care of pain management and is treated with Norco 10 mg for her chronic knee pain.  She has seen a provider at [facility] and is expected to undergo total knee replacement after she is able to lose about 25 pounds.  So far she is down about 8 pounds through cutting back.  She does continue to snack on chips in the evening time.  She is working on this.  Eliminated sugary beverages.   ROS: Per HPI  No Known Allergies Past Medical History:  Diagnosis Date   Arthritis    Functional heart murmur 2019   Hypertension 1980   Leg ulcer ([place])    Morbid obesity ([place])    Varicose veins     Current Outpatient Medications:    acetaminophen (TYLENOL) 650 MG CR tablet, Take 650 mg by mouth every 8 (eight) hours as needed for pain., Disp: , Rfl:    Ascorbic Acid 53 MG LOZG, Take by mouth., Disp: , Rfl:    atorvastatin (LIPITOR) 40 MG tablet, TAKE 1 TABLET (40 MG TOTAL) BY MOUTH DAILY., Disp: 90 tablet, Rfl: 1   cyclobenzaprine (FLEXERIL) 5 MG tablet, Take 1 tablet (5 mg total) by mouth 3 (three) times daily as needed for muscle spasms., Disp: 30 tablet, Rfl: 1   diclofenac Sodium (VOLTAREN) 1 % GEL, Apply 4 g topically 4 (four) times daily. As needed for joint pain, Disp: 400 g, Rfl: 3   diltiazem (CARDIZEM CD) 240 MG 24 hr capsule, TAKE 1 CAPSULE EVERY DAY, Disp: 90 capsule, Rfl: 1   famotidine (PEPCID) 40 MG tablet, TAKE 1 TABLET TWICE DAILY, Disp: 180 tablet, Rfl: 1   furosemide (LASIX) 40 MG tablet, TAKE 1 TABLET EVERY  DAY, Disp: 90 tablet, Rfl: 1   ibuprofen (ADVIL) 800 MG tablet, Take 0.5-1 tablets (400-800 mg total) by mouth 2 (two) times daily as needed for moderate pain., Disp: 180 tablet, Rfl: 1   lansoprazole (PREVACID) 30 MG capsule, TAKE 1 CAPSULE EVERY DAY AT 12 NOON., Disp: 90 capsule, Rfl: 1   losartan (COZAAR) 50 MG tablet, TAKE 1 TABLET EVERY DAY, Disp: 90 tablet, Rfl: 1   traMADol (ULTRAM) 50 MG tablet, Take 1 tablet (50 mg total) by mouth 3 (three) times daily as needed., Disp: 30 tablet, Rfl: 0 Social History   Socioeconomic History   Marital status: Widowed    Spouse name: Not on file   Number of children: 3   Years of education: 12   Highest education level: High school graduate  Occupational History   Occupation: retired    Comment: Retired Magazine features editor  Tobacco Use   Smoking status: Former    Types: Cigarettes    Quit date: 05/11/1975    Years since quitting: 47.1   Smokeless tobacco: Never  Vaping Use   Vaping Use: Never used  Substance and Sexual Activity   Alcohol use: Yes    Alcohol/week: 0.0 standard drinks of alcohol    Comment: 1 glass of wine every 2-3 weeks  Drug use: No   Sexual activity: Not Currently    Birth control/protection: Surgical  Other Topics Concern   Not on file  Social History Narrative   Not on file   Social Determinants of Health   Financial Resource Strain: Low Risk  (03/09/2019)   Overall Financial Resource Strain (CARDIA)    Difficulty of Paying Living Expenses: Not hard at all  Food Insecurity: No Food Insecurity (03/09/2019)   Hunger Vital Sign    Worried About Running Out of Food in the Last Year: Never true    Ran Out of Food in the Last Year: Never true  Transportation Needs: No Transportation Needs (03/09/2019)   PRAPARE - Hydrologist (Medical): No    Lack of Transportation (Non-Medical): No  Physical Activity: Inactive (03/09/2019)   Exercise Vital Sign    Days of Exercise per Week: 0 days    Minutes  of Exercise per Session: 0 min  Stress: No Stress Concern Present (03/09/2019)   [place]    Feeling of Stress : Not at all  Social Connections: Moderately Integrated (03/09/2019)   Social Connection and Isolation Panel [NHANES]    Frequency of Communication with Friends and Family: More than three times a week    Frequency of Social Gatherings with Friends and Family: More than three times a week    Attends Religious Services: More than 4 times per year    Active Member of [employer] or Organizations: Yes    Attends Archivist Meetings: More than 4 times per year    Marital Status: Widowed  Intimate Partner Violence: Not At Risk (03/09/2019)   Humiliation, Afraid, Rape, and Kick questionnaire    Fear of Current or Ex-Partner: No    Emotionally Abused: No    Physically Abused: No    Sexually Abused: No   Family History  Problem Relation Age of Onset   Hypertension Mother    Stroke Mother        after 53   Hyperlipidemia Mother    Thyroid disease Mother    Hypertension Father    Cirrhosis Father    Alcohol abuse Father    Heart attack Sister 23   Hypertension Brother    Ovarian cancer Neg Hx    Breast cancer Neg Hx    Prostate cancer Neg Hx    Colon cancer Neg Hx     Objective: Office vital signs reviewed. BP 131/79   Pulse 85   Temp 98.8 F (37.1 C)   Ht '5\' 8"'$  (1.727 m)   Wt 278 lb (126.1 kg)   SpO2 96%   BMI 42.27 kg/m   Physical Examination:  General: Awake, alert, morbidly obese, No acute distress HEENT: sclera white, MMM Cardio: regular rate and rhythm, S1S2 heard, no murmurs appreciated Pulm: clear to auscultation bilaterally, no wheezes, rhonchi or rales; normal work of breathing on room air but short of breath with ambulation.  She has normal work of breathing at rest MSK: Antalgic gait and station.  Assessment/ Plan: 77 y.o. female   New onset type 2 diabetes mellitus ([place]) -  Plan: Semaglutide,0.25 or 0.'5MG'$ /DOS, (OZEMPIC, 0.25 OR 0.5 MG/DOSE,) 2 MG/3ML SOPN, metFORMIN (GLUCOPHAGE) 500 MG tablet  Hypertension associated with diabetes ([place])  Hyperlipidemia associated with type 2 diabetes mellitus ([place]) - Plan: CMP14+EGFR, Lipid panel, TSH, CMP14+EGFR, Lipid panel, TSH  Morbid obesity ([place]) - Plan: Bayer DCA Hb A1c Waived, CMP14+EGFR, Bayer  DCA Hb A1c Waived, CMP14+EGFR  Chronic pain of left knee - Plan: CMP14+EGFR, CMP14+EGFR  Gastroesophageal reflux disease without esophagitis - Plan: CMP14+EGFR, CBC, CBC, CMP14+EGFR  Osteopenia of right femoral neck - Plan: CMP14+EGFR, DG [facility] DEXA, CMP14+EGFR  Unfortunately A1c demonstrated new onset of diabetes today.  A1c was 6.6.  We discussed consideration for GLP and metformin.  She was amenable and there are no contraindications to use.  First injection of 0.25 mg was administered today and I have given her 3 additional weeks of samples.  Order has been placed for 0.5 mg and I instructed her to start that in 1 month when she has completed the 0.25 mg for total 4 weeks.  We discussed potential side effects including nausea, vomiting, abdominal pain and diarrhea.  We discussed ways to reduce these risks including eating small meals that are not fried or heavy in carbs.  Hopefully the weight loss will allow her to be more mobile and have reduced knee pain.  Will plan for diabetic foot exam at next visit, urine microalbumin.  She will need to set up diabetic eye exam.  Handout provided on gold standards of care for diabetics.  We will plan to follow-up again in the next couple of months, sooner if concerns arise  Additionally, due for DEXA scan soon so we collected calcium level and future order for DEXA scan placed today  No orders of the defined types were placed in this encounter.  No orders of the defined types were placed in this encounter.    Kim Norlander, DO [place] [phone]

## 2022-07-07 LAB — LIPID PANEL
Chol/HDL Ratio: 2.5 ratio (ref 0.0–4.4)
Cholesterol, Total: 125 mg/dL (ref 100–199)
HDL: 50 mg/dL (ref >39)
LDL Chol Calc (NIH): 61 mg/dL (ref 0–99)
Triglycerides: 64 mg/dL (ref 0–149)
VLDL Cholesterol Cal: 14 mg/dL (ref 5–40)

## 2022-07-07 LAB — CMP14+EGFR
ALT: 6 IU/L (ref 0–32)
AST: 9 IU/L (ref 0–40)
Albumin/Globulin Ratio: 1.7 (ref 1.2–2.2)
Albumin: 4.2 g/dL (ref 3.8–4.8)
Alkaline Phosphatase: 169 IU/L — ABNORMAL HIGH (ref 44–121)
BUN/Creatinine Ratio: 16 (ref 12–28)
BUN: 17 mg/dL (ref 8–27)
Bilirubin Total: 1 mg/dL (ref 0.0–1.2)
CO2: 22 mmol/L (ref 20–29)
Calcium: 9.2 mg/dL (ref 8.7–10.3)
Chloride: 104 mmol/L (ref 96–106)
Creatinine, Ser: 1.07 mg/dL — ABNORMAL HIGH (ref 0.57–1.00)
Globulin, Total: 2.5 g/dL (ref 1.5–4.5)
Glucose: 99 mg/dL (ref 70–99)
Potassium: 4.6 mmol/L (ref 3.5–5.2)
Sodium: 144 mmol/L (ref 134–144)
Total Protein: 6.7 g/dL (ref 6.0–8.5)
eGFR: 55 mL/min/{1.73_m2} — ABNORMAL LOW (ref >59)

## 2022-07-07 LAB — CBC
Hematocrit: 35.6 % (ref 34.0–46.6)
Hemoglobin: 11.6 g/dL (ref 11.1–15.9)
MCH: 25.1 pg — ABNORMAL LOW (ref 26.6–33.0)
MCHC: 32.6 g/dL (ref 31.5–35.7)
MCV: 77 fL — ABNORMAL LOW (ref 79–97)
Platelets: 295 10*3/uL (ref 150–450)
RBC: 4.63 x10E6/uL (ref 3.77–5.28)
RDW: 14.6 % (ref 11.7–15.4)
WBC: 7.1 10*3/uL (ref 3.4–10.8)

## 2022-07-07 LAB — TSH: TSH: 1.49 u[IU]/mL (ref 0.450–4.500)

## 2022-07-12 ENCOUNTER — Telehealth: Payer: Self-pay | Admitting: Family Medicine

## 2022-07-12 NOTE — Telephone Encounter (Signed)
Called patient to schedule Medicare Annual Wellness Visit (AWV). Left message for patient to call back and schedule Medicare Annual Wellness Visit (AWV).  Last date of AWV: 05/08/2021   Please schedule an appointment at any time with either Mickel Baas or [place], [facility]. .  If any questions, please contact me at [phone].  Thank you,  Colletta [place],  [place] Direct Dial ??[phone]

## 2022-07-20 ENCOUNTER — Telehealth: Payer: Self-pay | Admitting: Family Medicine

## 2022-07-20 DIAGNOSIS — R0609 Other forms of dyspnea: Secondary | ICD-10-CM

## 2022-07-20 NOTE — Telephone Encounter (Signed)
done

## 2022-07-20 NOTE — Telephone Encounter (Signed)
Patient says Dr Lajuana Ripple told her at her last visit that if her SOB didn't get any better to let her know and she could refer her to see a lung specialist.  Pt says her breathing is not any better and does want Dr Lajuana Ripple to go ahead and send referral for her.

## 2022-07-30 ENCOUNTER — Telehealth: Payer: Self-pay | Admitting: Family Medicine

## 2022-07-30 DIAGNOSIS — E119 Type 2 diabetes mellitus without complications: Secondary | ICD-10-CM

## 2022-07-30 MED ORDER — OZEMPIC (0.25 OR 0.5 MG/DOSE) 2 MG/3ML ~~LOC~~ SOPN: 0.5000 mg | PEN_INJECTOR | SUBCUTANEOUS | 3 refills | Status: DC

## 2022-07-30 NOTE — Telephone Encounter (Signed)
Request came from pharmacy, resent

## 2022-08-04 ENCOUNTER — Ambulatory Visit (INDEPENDENT_AMBULATORY_CARE_PROVIDER_SITE_OTHER): Payer: Medicare PPO | Admitting: Family Medicine

## 2022-08-04 ENCOUNTER — Other Ambulatory Visit: Payer: Medicare PPO

## 2022-08-04 ENCOUNTER — Encounter: Payer: Self-pay | Admitting: Family Medicine

## 2022-08-04 VITALS — BP 136/68 | HR 63 | Temp 98.7°F | Ht 68.0 in | Wt 274.0 lb

## 2022-08-04 DIAGNOSIS — E1169 Type 2 diabetes mellitus with other specified complication: Secondary | ICD-10-CM

## 2022-08-04 DIAGNOSIS — E119 Type 2 diabetes mellitus without complications: Secondary | ICD-10-CM

## 2022-08-04 DIAGNOSIS — E785 Hyperlipidemia, unspecified: Secondary | ICD-10-CM

## 2022-08-04 DIAGNOSIS — I152 Hypertension secondary to endocrine disorders: Secondary | ICD-10-CM

## 2022-08-04 DIAGNOSIS — E1159 Type 2 diabetes mellitus with other circulatory complications: Secondary | ICD-10-CM | POA: Diagnosis not present

## 2022-08-04 MED ORDER — LANCET DEVICE MISC: 0 refills | Status: DC

## 2022-08-04 MED ORDER — BLOOD GLUCOSE TEST VI STRP: ORAL_STRIP | 99 refills | Status: DC

## 2022-08-04 MED ORDER — BLOOD GLUCOSE MONITORING SUPPL DEVI: 0 refills | Status: DC

## 2022-08-04 MED ORDER — LANCETS MISC. MISC: 99 refills | Status: DC

## 2022-08-04 NOTE — Progress Notes (Signed)
Subjective: CC:New DM PCP: Janora Norlander, DO [id number] D Sudderth is a 77 y.o. female presenting to clinic today for:  1. Type 2 Diabetes with hypertension, hyperlipidemia:  Newly diagnosed 06/2022  She was started on Metformin and Ozempic.  She just picked up the 0.5 mg weekly dose but has not yet started.  Last 0.25 mg was injected yesterday.  She reports loose stools with metformin.  She denies any nausea, vomiting, abdominal pain but does report decreased appetite such that she is typically having yogurt or something small in the morning, not having lunch and eating something like for supper like a chicken salad.  No GERD symptoms reported.  She feels like joints are actually feeling a little bit better.  She has been replacing some of her beverages with sugar-free lemonade and she is trying to stay hydrated  Last eye exam: needs Last foot exam: needs Last A1c:  Lab Results  Component Value Date   HGBA1C 6.6 (H) 07/06/2022   Nephropathy screen indicated?: needs Last flu, zoster and/or pneumovax:  Immunization History  Administered Date(s) Administered   Influenza, High Dose Seasonal PF 05/09/2018   Influenza,inj,Quad PF,6+ Mos 05/26/2020   PFIZER(Purple Top)SARS-COV-2 Vaccination 08/06/2019, 09/03/2019   Pneumococcal Conjugate-13 07/08/2020    ROS: No chest pain, shortness of breath, dizziness    ROS: Per HPI  No Known Allergies Past Medical History:  Diagnosis Date   Arthritis    Functional heart murmur 2019   Hypertension 1980   Leg ulcer ([place])    Morbid obesity ([place])    Varicose veins     Current Outpatient Medications:    acetaminophen (TYLENOL) 650 MG CR tablet, Take 650 mg by mouth every 8 (eight) hours as needed for pain., Disp: , Rfl:    Ascorbic Acid 53 MG LOZG, Take by mouth., Disp: , Rfl:    atorvastatin (LIPITOR) 40 MG tablet, TAKE 1 TABLET (40 MG TOTAL) BY MOUTH DAILY., Disp: 90 tablet, Rfl: 1   cyclobenzaprine (FLEXERIL) 5 MG tablet, Take 1  tablet (5 mg total) by mouth 3 (three) times daily as needed for muscle spasms., Disp: 30 tablet, Rfl: 1   diclofenac Sodium (VOLTAREN) 1 % GEL, Apply 4 g topically 4 (four) times daily. As needed for joint pain, Disp: 400 g, Rfl: 3   diltiazem (CARDIZEM CD) 240 MG 24 hr capsule, TAKE 1 CAPSULE EVERY DAY, Disp: 90 capsule, Rfl: 1   famotidine (PEPCID) 40 MG tablet, TAKE 1 TABLET TWICE DAILY, Disp: 180 tablet, Rfl: 1   furosemide (LASIX) 40 MG tablet, TAKE 1 TABLET EVERY DAY, Disp: 90 tablet, Rfl: 1   ibuprofen (ADVIL) 800 MG tablet, Take 0.5-1 tablets (400-800 mg total) by mouth 2 (two) times daily as needed for moderate pain., Disp: 180 tablet, Rfl: 1   lansoprazole (PREVACID) 30 MG capsule, TAKE 1 CAPSULE EVERY DAY AT 12 NOON., Disp: 90 capsule, Rfl: 1   losartan (COZAAR) 50 MG tablet, TAKE 1 TABLET EVERY DAY, Disp: 90 tablet, Rfl: 1   metFORMIN (GLUCOPHAGE) 500 MG tablet, Take 1 tablet (500 mg total) by mouth daily with breakfast. For sugar, Disp: 90 tablet, Rfl: 3   Semaglutide,0.25 or 0.5MG /DOS, (OZEMPIC, 0.25 OR 0.5 MG/DOSE,) 2 MG/3ML SOPN, Inject 0.5 mg into the skin every 7 (seven) days., Disp: 9 mL, Rfl: 3   traMADol (ULTRAM) 50 MG tablet, Take 1 tablet (50 mg total) by mouth 3 (three) times daily as needed., Disp: 30 tablet, Rfl: 0 Social History   Socioeconomic History  Marital status: Widowed    Spouse name: Not on file   Number of children: 3   Years of education: 12   Highest education level: High school graduate  Occupational History   Occupation: retired    Comment: Retired Magazine features editor  Tobacco Use   Smoking status: Former    Types: Cigarettes    Quit date: 05/11/1975    Years since quitting: 47.2   Smokeless tobacco: Never  Vaping Use   Vaping Use: Never used  Substance and Sexual Activity   Alcohol use: Yes    Alcohol/week: 0.0 standard drinks of alcohol    Comment: 1 glass of wine every 2-3 weeks   Drug use: No   Sexual activity: Not Currently    Birth  control/protection: Surgical  Other Topics Concern   Not on file  Social History Narrative   Not on file   Social Determinants of Health   Financial Resource Strain: Low Risk  (03/09/2019)   Overall Financial Resource Strain (CARDIA)    Difficulty of Paying Living Expenses: Not hard at all  Food Insecurity: No Food Insecurity (03/09/2019)   Hunger Vital Sign    Worried About Running Out of Food in the Last Year: Never true    Ran Out of Food in the Last Year: Never true  Transportation Needs: No Transportation Needs (03/09/2019)   PRAPARE - Hydrologist (Medical): No    Lack of Transportation (Non-Medical): No  Physical Activity: Inactive (03/09/2019)   Exercise Vital Sign    Days of Exercise per Week: 0 days    Minutes of Exercise per Session: 0 min  Stress: No Stress Concern Present (03/09/2019)   [place]    Feeling of Stress : Not at all  Social Connections: Moderately Integrated (03/09/2019)   Social Connection and Isolation Panel [NHANES]    Frequency of Communication with Friends and Family: More than three times a week    Frequency of Social Gatherings with Friends and Family: More than three times a week    Attends Religious Services: More than 4 times per year    Active Member of [employer] or Organizations: Yes    Attends Archivist Meetings: More than 4 times per year    Marital Status: Widowed  Intimate Partner Violence: Not At Risk (03/09/2019)   Humiliation, Afraid, Rape, and Kick questionnaire    Fear of Current or Ex-Partner: No    Emotionally Abused: No    Physically Abused: No    Sexually Abused: No   Family History  Problem Relation Age of Onset   Hypertension Mother    Stroke Mother        after 49   Hyperlipidemia Mother    Thyroid disease Mother    Hypertension Father    Cirrhosis Father    Alcohol abuse Father    Heart attack Sister 47    Hypertension Brother    Ovarian cancer Neg Hx    Breast cancer Neg Hx    Prostate cancer Neg Hx    Colon cancer Neg Hx     Objective: Office vital signs reviewed. BP 136/68   Pulse 63   Temp 98.7 F (37.1 C)   Ht 5\' 8"  (1.727 m)   Wt 274 lb (124.3 kg)   SpO2 94%   BMI 41.66 kg/m   Physical Examination:  General: Awake, alert, morbidly obese, No acute distress HEENT: Sclera white.  Moist mucous  membranes Cardio: regular rate and rhythm, S1S2 heard, no murmurs appreciated Pulm: clear to auscultation bilaterally, no wheezes, rhonchi or rales; normal work of breathing on room air Extremities: warm, lymphedema of the legs present Neuro: see DM foot Diabetic Foot Exam - Simple   Simple Foot Form Diabetic Foot exam was performed with the following findings: Yes 08/04/2022 12:38 PM  Visual Inspection No deformities, no ulcerations, no other skin breakdown bilaterally: Yes Sensation Testing Intact to touch and monofilament testing bilaterally: Yes Pulse Check Posterior Tibialis and Dorsalis pulse intact bilaterally: Yes Comments Long, onychomycotic toenails noted     Assessment/ Plan: 77 y.o. female   New onset type 2 diabetes mellitus ([place]) - Plan: Blood Glucose Monitoring Suppl DEVI, Glucose Blood (BLOOD GLUCOSE TEST STRIPS) STRP, Lancet Device MISC, Lancets Misc. MISC, Basic Metabolic Panel, CANCELED: Microalbumin / creatinine urine ratio  Hypertension associated with diabetes ([place]) - Plan: Basic Metabolic Panel  Hyperlipidemia associated with type 2 diabetes mellitus ([place])  Morbid obesity ([place])  Needs urine microalbumin but could not void today.  Recheck renal function given mild reduction in GFR noted on last checkup.  I have sent in diabetic testing supplies.  She will continue Ozempic 0.5 mg weekly as directed.  I will see her back in 2 months for repeat A1c.  She already seems to be having a positive clinical result from a weight standpoint with a 4 pound reduction  since our last visit.  Encouraged p.o. hydration so as not to exacerbate constipation.  She knows to discontinue metformin given the diarrhea that she has been experiencing.  Blood pressure is controlled.  No changes  Continue statin.   Orders Placed This Encounter  Procedures   Basic Metabolic Panel   No orders of the defined types were placed in this encounter.    Janora Norlander, DO [place] [phone]

## 2022-08-04 NOTE — Patient Instructions (Addendum)
Schedule DIABETIC eye exam with your eye doctor We performed a yearly foot exam for diabetes today. Plan for urine specimen next visit  Diabetes Mellitus and Standards of [place] with and managing diabetes (diabetes mellitus) can be complicated. Your diabetes treatment may be managed by a team of health care providers, including: A physician who specializes in diabetes (endocrinologist). You might also have visits with a nurse practitioner or physician assistant. Nurses. A registered dietitian. A certified diabetes care and education specialist. An exercise specialist. A pharmacist. An eye doctor. A foot specialist (podiatrist). A dental care provider. A primary care provider. A mental health care provider. How to manage your diabetes You can do many things to successfully manage your diabetes. Your health care providers will follow guidelines to help you get the best quality of care. Here are general guidelines for your diabetes management plan. Your health care providers may give you more specific instructions. Physical exams When you are diagnosed with diabetes, and each year after that, your health care provider will ask about your medical and family history. You will have a physical exam, which may include: Measuring your height, weight, and body mass index (BMI). Checking your blood pressure. This will be done at every routine medical visit. Your target blood pressure may vary depending on your medical conditions, your age, and other factors. A thyroid exam. A skin exam. Screening for nerve damage (peripheral neuropathy). This may include checking the pulse in your legs and feet and the level of sensation in your hands and feet. A foot exam to inspect the structure and skin of your feet, including checking for cuts, bruises, redness, blisters, sores, or other problems. Screening for blood vessel (vascular) problems. This may include checking the pulse in your legs and  feet and checking your temperature. Blood tests Depending on your treatment plan and your personal needs, you may have the following tests: Hemoglobin A1C (HbA1C). This test provides information about blood sugar (glucose) control over the previous 2-3 months. It is used to adjust your treatment plan, if needed. This test will be done: At least 2 times a year, if you are meeting your treatment goals. 4 times a year, if you are not meeting your treatment goals or if your goals have changed. Lipid testing, including total cholesterol, LDL and HDL cholesterol, and triglyceride levels. The goal for LDL is less than 100 mg/dL (5.5 mmol/L). If you are at high risk for complications, the goal is less than 70 mg/dL (3.9 mmol/L). The goal for HDL is 40 mg/dL (2.2 mmol/L) or higher for men, and 50 mg/dL (2.8 mmol/L) or higher for women. An HDL cholesterol of 60 mg/dL (3.3 mmol/L) or higher gives some protection against heart disease. The goal for triglycerides is less than 150 mg/dL (8.3 mmol/L). Liver function tests. Kidney function tests. Thyroid function tests.  Dental and eye exams  Visit your dentist two times a year. If you have type 1 diabetes, your health care provider may recommend an eye exam within 5 years after you are diagnosed, and then once a year after your first exam. For children with type 1 diabetes, the health care provider may recommend an eye exam when your child is age 65 or older and has had diabetes for 3-5 years. After the first exam, your child should get an eye exam once a year. If you have type 2 diabetes, your health care provider may recommend an eye exam as soon as you are diagnosed, and then every  1-2 years after your first exam. Immunizations A yearly flu (influenza) vaccine is recommended annually for everyone 6 months or older. This is especially important if you have diabetes. The pneumonia (pneumococcal) vaccine is recommended for everyone 2 years or older who has  diabetes. If you are age 71 or older, you may get the pneumonia vaccine as a series of two separate shots. The hepatitis B vaccine is recommended for adults shortly after being diagnosed with diabetes. Adults and children with diabetes should receive all other vaccines according to age-specific recommendations from the Centers for Disease Control and Prevention (CDC). Mental and emotional health Screening for symptoms of eating disorders, anxiety, and depression is recommended at the time of diagnosis and after as needed. If your screening shows that you have symptoms, you may need more evaluation. You may work with a mental health care provider. Follow these instructions at home: Treatment plan You will monitor your blood glucose levels and may give yourself insulin. Your treatment plan will be reviewed at every medical visit. You and your health care provider will discuss: How you are taking your medicines, including insulin. Any side effects you have. Your blood glucose level target goals. How often you monitor your blood glucose level. Lifestyle habits, such as activity level and tobacco, alcohol, and substance use. Education Your health care provider will assess how well you are monitoring your blood glucose levels and whether you are taking your insulin and medicines correctly. He or she may refer you to: A certified diabetes care and education specialist to manage your diabetes throughout your life, starting at diagnosis. A registered dietitian who can create and review your personal nutrition plan. An exercise specialist who can discuss your activity level and exercise plan. General instructions Take over-the-counter and prescription medicines only as told by your health care provider. Keep all follow-up visits. This is important. Where to find support There are many diabetes support networks, including: American Diabetes Association (ADA): diabetes.org Defeat Diabetes Foundation:  defeatdiabetes.org Where to find more information American Diabetes Association (ADA): www.diabetes.org Association of Diabetes Care & Education Specialists (ADCES): diabeteseducator.org International Diabetes Federation (IDF): https://www.munoz-bell.org/ Summary Managing diabetes (diabetes mellitus) can be complicated. Your diabetes treatment may be managed by a team of health care providers. Your health care providers follow guidelines to help you get the best quality care. You should have physical exams, blood tests, blood pressure monitoring, immunizations, and screening tests regularly. Stay updated on how to manage your diabetes. Your health care providers may also give you more specific instructions based on your individual health. This information is not intended to replace advice given to you by your health care provider. Make sure you discuss any questions you have with your health care provider. Document Revised: 11/01/2019 Document Reviewed: 11/01/2019 Elsevier Patient Education  [place].

## 2022-08-05 LAB — BASIC METABOLIC PANEL
BUN/Creatinine Ratio: 15 (ref 12–28)
BUN: 13 mg/dL (ref 8–27)
CO2: 22 mmol/L (ref 20–29)
Calcium: 8.9 mg/dL (ref 8.7–10.3)
Chloride: 103 mmol/L (ref 96–106)
Creatinine, Ser: 0.84 mg/dL (ref 0.57–1.00)
Glucose: 91 mg/dL (ref 70–99)
Potassium: 3.9 mmol/L (ref 3.5–5.2)
Sodium: 144 mmol/L (ref 134–144)
eGFR: 73 mL/min/{1.73_m2} (ref >59)

## 2022-08-09 ENCOUNTER — Ambulatory Visit (INDEPENDENT_AMBULATORY_CARE_PROVIDER_SITE_OTHER): Payer: Medicare PPO

## 2022-08-09 DIAGNOSIS — M8588 Other specified disorders of bone density and structure, other site: Secondary | ICD-10-CM | POA: Diagnosis not present

## 2022-08-09 DIAGNOSIS — M85851 Other specified disorders of bone density and structure, right thigh: Secondary | ICD-10-CM

## 2022-08-17 ENCOUNTER — Telehealth: Payer: Self-pay | Admitting: Family Medicine

## 2022-08-17 NOTE — Telephone Encounter (Signed)
Contacted Kim Wilkerson to schedule their annual wellness visit. Appointment made for 08/24/2022.  Thank you,  Judeth Cornfield,  AMB Clinical Support [facility] AWV Program Direct Dial ??[phone]

## 2022-08-24 NOTE — Progress Notes (Deleted)
Kim Wilkerson, female    DOB: 1945/05/27,   MRN: [id number]   Brief patient profile:  42  yobf quit smoking 1977 with wt baseline 180 with progressive wt gain referred to pulmonary clinic in [place]  06/29/2021 by DR [facility] for doe.  Last time wt was down 238 2022 slowed by knees and progressive wt gain since then    History of Present Illness  06/29/2021  Pulmonary/ 1st office eval/ Kim Wilkerson / [facility]  Chief Complaint  Patient presents with   Consult    Ref by Dr. Thea Gist for DOE for the last 6 months. Worse with steps.  Dyspnea:  mailbox to house 50 ft flat / walks with cane  Cough: some choking at night and bad HB/ freq throat clearing  Sleep: cpap x 2 weeks  per [facility] per [place] Dr Lacey Jensen / only doing 2 hours initially and building up to 6h  SABA use: none Rec In addition to the blood work you have planned next week you need TSH and BNP with chest xray and I will be able to see the results in our computer Prevacid 30 mg  Take  30-60 min before first meal of the day and Pepcid (famotidine)  40 mg after supper until return to office - this is the best way to tell whether stomach acid is contributing to your problem.  GERD diet reviewed, bed blocks rec   Please schedule a follow up visit in 3 months but call sooner if needed - PFTs  > not done as of 09/25/2021    09/25/2021  f/u ov/Kim Wilkerson re: doe maint on gerd  rx though confused with names of meds  Chief Complaint  Patient presents with   Follow-up    Breathing doing better  Echo done on 09/16/21   Dyspnea:  limited by knees / still does krogers leaning on basket  stops every aisle  Cough: none  Sleeping: bed is flat / 3-4 pillows  SABA use: none  02: none   Rec Pantoprazole (protonix) 40 mg   Take  30-60 min before first meal of the day and Pepcid (famotidine)  40 mg after supper     We will walk you again today for a baseline in case your breathing ever gets worse  Best bet is to  let your PCP work on your blood pressure and wt loss with pulmonary follow up as needed   We willneed to set up a PFT in [place] or [place] next available and call you with the results and get you back in if we see a lung problem, but I don't think you have one at this point.    08/25/2022  f/u ov/[place] office/Romina Divirgilio re: *** maint on ***  No chief complaint on file.   Dyspnea:  *** Cough: *** Sleeping: *** SABA use: *** 02: *** Covid status: *** Lung cancer screening: ***   No obvious day to day or daytime variability or assoc excess/ purulent sputum or mucus plugs or hemoptysis or cp or chest tightness, subjective wheeze or overt sinus or hb symptoms.   *** without nocturnal  or early am exacerbation  of respiratory  c/o's or need for noct saba. Also denies any obvious fluctuation of symptoms with weather or environmental changes or other aggravating or alleviating factors except as outlined above   No unusual exposure hx or h/o childhood pna/ asthma or knowledge of premature birth.  Current Allergies, Complete Past Medical History, Past Surgical History,  Family History, and Social History were reviewed in [employer] record.  ROS  The following are not active complaints unless bolded Hoarseness, sore throat, dysphagia, dental problems, itching, sneezing,  nasal congestion or discharge of excess mucus or purulent secretions, ear ache,   fever, chills, sweats, unintended wt loss or wt gain, classically pleuritic or exertional cp,  orthopnea pnd or arm/hand swelling  or leg swelling, presyncope, palpitations, abdominal pain, anorexia, nausea, vomiting, diarrhea  or change in bowel habits or change in bladder habits, change in stools or change in urine, dysuria, hematuria,  rash, arthralgias, visual complaints, headache, numbness, weakness or ataxia or problems with walking or coordination,  change in mood or  memory.        No outpatient medications have  been marked as taking for the 08/25/22 encounter (Appointment) with Nyoka Cowden, MD.           Past Medical History:  Diagnosis Date   Arthritis    Functional heart murmur 2019   Hypertension 1980   Leg ulcer (HCC)    Morbid obesity (HCC)    Varicose veins        Objective:    wts   08/25/2022       ***   09/25/21 290 lb 9.6 oz (131.8 kg)  09/23/21 289 lb 12.8 oz (131.5 kg)  06/29/21 297 lb (134.7 kg)     Vital signs reviewed  08/25/2022  - Note at rest 02 sats  ***% on ***   General appearance:    ***      trace pitting edema both LE ? Lymphedema also ?             Assessment

## 2022-08-25 ENCOUNTER — Institutional Professional Consult (permissible substitution): Payer: Medicare PPO | Admitting: Internal Medicine

## 2022-08-30 ENCOUNTER — Telehealth: Payer: Self-pay | Admitting: Family Medicine

## 2022-08-30 NOTE — Telephone Encounter (Signed)
Called patient to schedule Medicare Annual Wellness Visit (AWV). Left message for patient to call back and schedule Medicare Annual Wellness Visit (AWV).  Last date of AWV: 05/08/2021   Please schedule an appointment at any time with either Laura or Courtney, NHA's. .  If any questions, please contact me at [phone].  Thank you,  Stephanie,  AMB Clinical Support [facility] AWV Program Direct Dial ??[phone]    

## 2022-09-07 ENCOUNTER — Other Ambulatory Visit: Payer: Self-pay | Admitting: Family Medicine

## 2022-09-07 DIAGNOSIS — I10 Essential (primary) hypertension: Secondary | ICD-10-CM

## 2022-09-07 DIAGNOSIS — E782 Mixed hyperlipidemia: Secondary | ICD-10-CM

## 2022-09-07 DIAGNOSIS — K219 Gastro-esophageal reflux disease without esophagitis: Secondary | ICD-10-CM

## 2022-09-09 ENCOUNTER — Institutional Professional Consult (permissible substitution): Payer: Medicare PPO | Admitting: Internal Medicine

## 2022-09-09 ENCOUNTER — Other Ambulatory Visit: Payer: Self-pay | Admitting: *Deleted

## 2022-09-09 DIAGNOSIS — G8929 Other chronic pain: Secondary | ICD-10-CM

## 2022-09-09 MED ORDER — TRUE METRIX AIR GLUCOSE METER W/DEVICE KIT: PACK | 0 refills | Status: DC

## 2022-09-09 MED ORDER — TRUE METRIX BLOOD GLUCOSE TEST VI STRP: ORAL_STRIP | 3 refills | Status: DC

## 2022-09-10 ENCOUNTER — Other Ambulatory Visit: Payer: Self-pay | Admitting: *Deleted

## 2022-09-10 MED ORDER — TRUE METRIX LEVEL 1 LOW VI SOLN: 1 refills | Status: DC

## 2022-09-10 MED ORDER — TRUEPLUS LANCETS 33G MISC: 3 refills | Status: DC

## 2022-09-10 MED ORDER — BD SWAB SINGLE USE REGULAR PADS: MEDICATED_PAD | 3 refills | Status: DC

## 2022-09-10 NOTE — Telephone Encounter (Signed)
Is she requesting this refill?  I have a note that states she has severe gerd with NSAIDs?

## 2022-09-14 ENCOUNTER — Other Ambulatory Visit: Payer: Self-pay | Admitting: Family Medicine

## 2022-09-14 ENCOUNTER — Telehealth: Payer: Self-pay | Admitting: Family Medicine

## 2022-09-14 DIAGNOSIS — G8929 Other chronic pain: Secondary | ICD-10-CM

## 2022-09-14 IMAGING — DX DG CHEST 2V
2 series · 2 of 2 positions shown · non-contrast
Comparison: None.

CLINICAL DATA: Dyspnea on exertion, morbid obesity

EXAM:
CHEST - 2 VIEW

[chest pa]
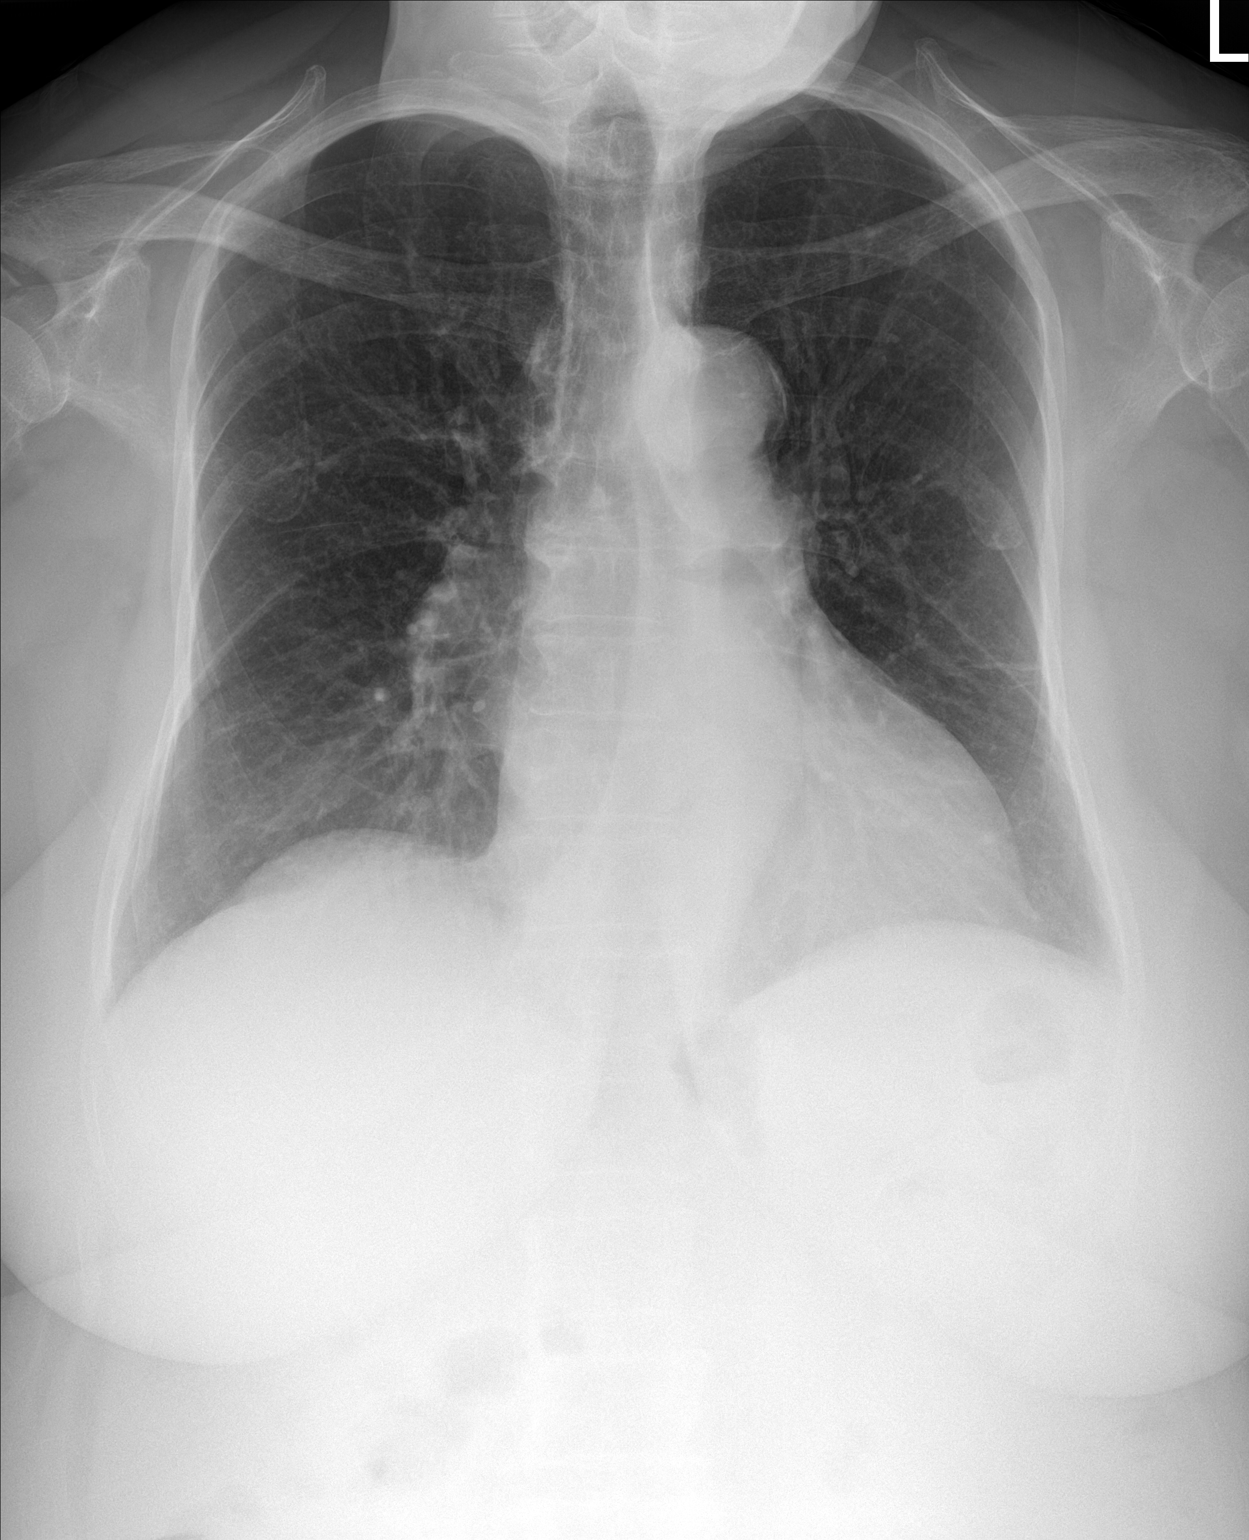

[chest lat]
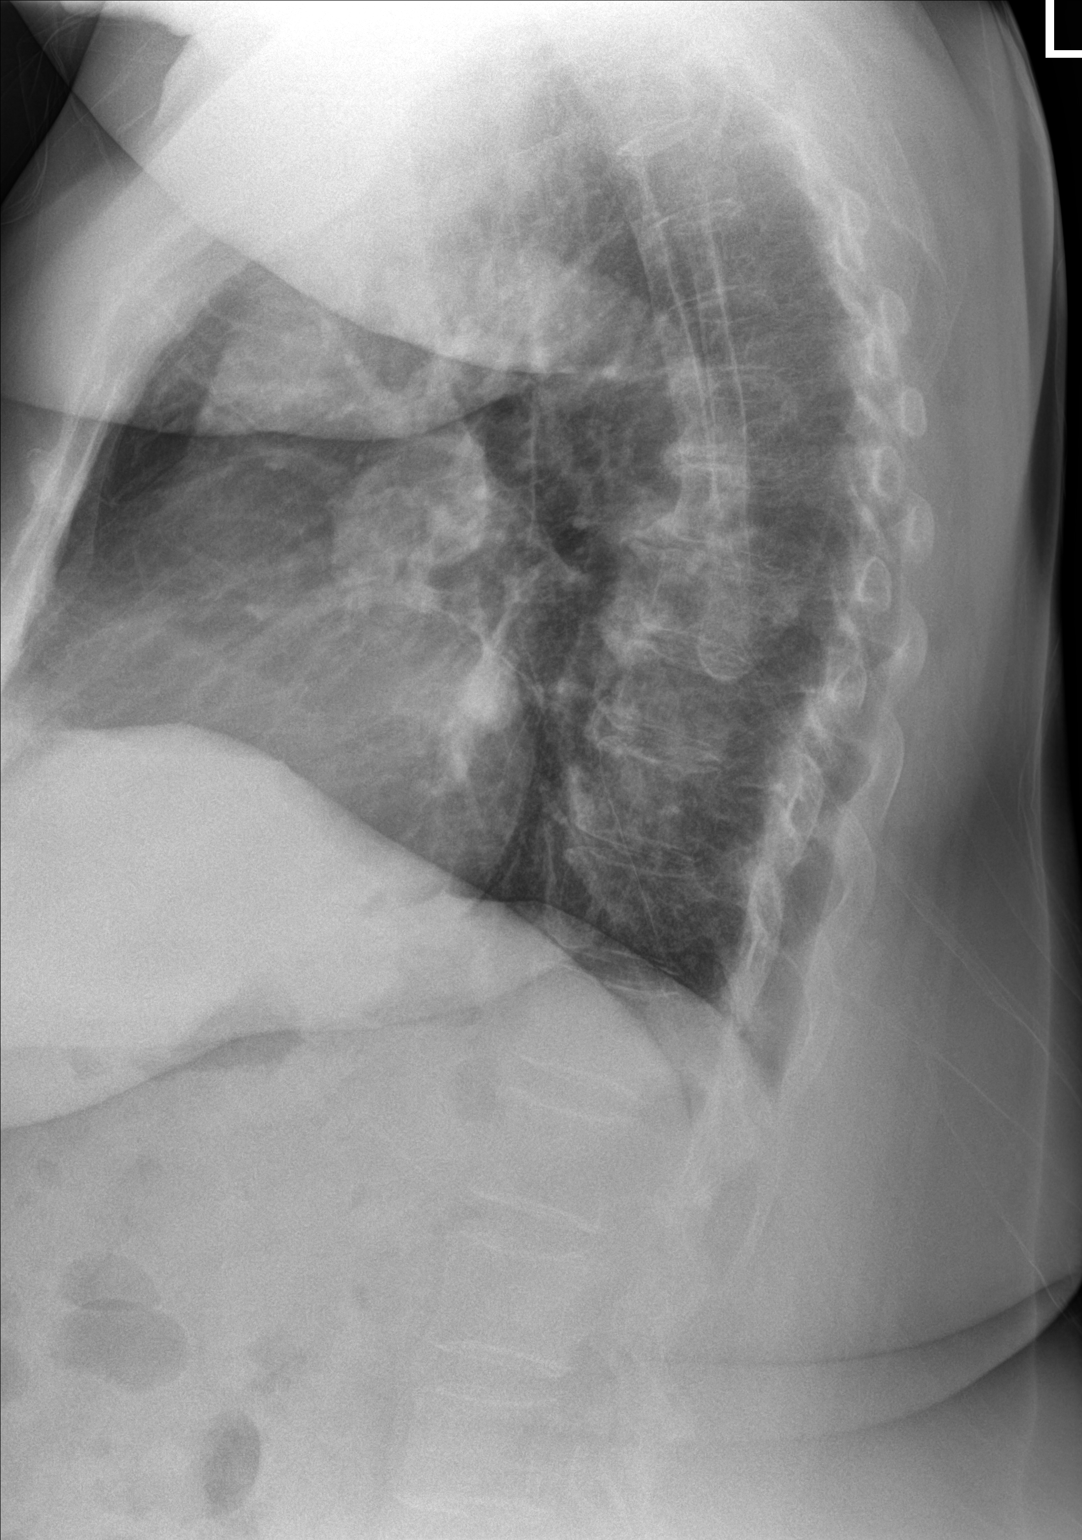

[2 of 2 positions shown; findings below may reference images not displayed]

FINDINGS: The heart size and mediastinal contours are within normal limits.
Both lungs are clear. The visualized skeletal structures are
unremarkable.
IMPRESSION: No active cardiopulmonary disease.

## 2022-09-14 MED ORDER — IBUPROFEN 800 MG PO TABS: 400.0000 mg | ORAL_TABLET | Freq: Two times a day (BID) | ORAL | 1 refills | Status: DC | PRN

## 2022-09-14 NOTE — Telephone Encounter (Signed)
  Prescription Request  09/14/2022  Is this a "Controlled Substance" medicine? no  Have you seen your PCP in the last 2 weeks? Pt last seen on 08/04/22  If YES, route message to pool  -  If NO, patient needs to be scheduled for appointment.  What is the name of the medication or equipment? ibuprofen (ADVIL) 800 MG tablet [   Have you contacted your pharmacy to request a refill? yes   Which pharmacy would you like this sent to? [facility]   Patient notified that their request is being sent to the clinical staff for review and that they should receive a response within 2 business days.

## 2022-09-23 ENCOUNTER — Ambulatory Visit: Payer: Medicare PPO | Admitting: Pulmonary Disease

## 2022-09-23 ENCOUNTER — Encounter: Payer: Self-pay | Admitting: Pulmonary Disease

## 2022-09-23 VITALS — BP 133/79 | HR 87 | Ht 68.0 in | Wt 261.0 lb

## 2022-09-23 DIAGNOSIS — R0609 Other forms of dyspnea: Secondary | ICD-10-CM

## 2022-09-23 DIAGNOSIS — G4733 Obstructive sleep apnea (adult) (pediatric): Secondary | ICD-10-CM

## 2022-09-23 NOTE — Assessment & Plan Note (Signed)
No clear cause for dyspnea is identified today.  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  Echo in 2023 did not show any evidence of pulmonary hypertension.  Clinically there is no evidence of ILD, chest x-ray was clear in 2023.  She has chronic leg edema for which she takes torsemide Will obtain a D-dimer and BNP , if D-dimer is elevated we will pursue CT angio chest to rule out pulm embolism although her history is more chronic.  She does have a remote history of DVT. If BNP is elevated we will repeat echo to rule out pulm hypertension as a possible cause.  We also have to consider that this may be an anginal equivalent in a diabetic and she may need referral to cardiology If above testing is negative, then we can attribute this simply to deconditioning and start an exercise/conditioning program

## 2022-09-23 NOTE — Assessment & Plan Note (Signed)
We will try to obtain her sleep studies from [facility] neurology, obtain a CPAP download from [facility] and take over her care. CPAP supplies will be renewed as needed  Weight loss encouraged, compliance with goal of at least 4-6 hrs every night is the expectation. Advised against medications with sedative side effects Cautioned against driving when sleepy - understanding that sleepiness will vary on a day to day basis

## 2022-09-23 NOTE — Patient Instructions (Addendum)
X obtain sleep studies from [facility] CPAP DL from [facility]  X Blood work - BMET, BNP, D -dimer Depending on this, we can decide about Ct scan of chest  X echocardiogram  X Amb sat

## 2022-09-23 NOTE — Progress Notes (Addendum)
Subjective:    Patient ID: Kim Wilkerson, female    DOB: 1945/09/24, 77 y.o.   MRN: [id number]  HPI  77 year old never smoker referred by PCP for evaluation of shortness of breath This has been ongoing since 2022 but she states this is worsened over the last 2 months.  She would also like to transfer her care for OSA to [place] from [facility] neurology She has seen my partner MW, last 09/2021 and dyspnea was attributed to obesity Of note, she had wt gain from baseline of 238 to a peak of 297 as of 06/29/2021 .  She has lost more than 30 pounds to her current weight of 261 pounds with Ozempic but continues to feel dyspneic.  In fact she is now dyspneic walking up the stairs or walking back from the mailbox.  She denies wheezing or charted history of asthma.  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  Prior echo has shown normal function. 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 She has a remote history of DVT,  duplex was neg 10/2021 -Hypertension Type 2 diabetes OSA was diagnosed by [facility] neurology, she is maintained on CPAP with a fullface mask, DME is [facility] She is a remote smoker and smoked less than 10 pack years before she quit in 1977   Chest x-ray 07/2021 was clear She did not desaturate on walking but was limited by pain in her knees  Significant tests/ events reviewed  NPSG 04/2020 moderate, AHI 16/hour, RDI 29/hour, low sat 81%, mild PLM's  Past Medical History:  Diagnosis Date   Arthritis    Functional heart murmur 2019   Hypertension 1980   Leg ulcer (HCC)    Morbid obesity (HCC)    Varicose veins     Past Surgical History:  Procedure Laterality Date   ABDOMINAL HYSTERECTOMY  1994   COLONOSCOPY N/A 07/01/2017   Procedure: COLONOSCOPY;  Surgeon: West Bali, MD;  Location: [facility];  Service: Endoscopy;  Laterality: N/A;  1:00   ESOPHAGEAL DILATION  11/2016   Dr Allena Katz in [place]     Allergies  Allergen Reactions   Metformin And Related Diarrhea    Social History   Socioeconomic History   Marital status: Widowed    Spouse name: Not on file   Number of children: 3   Years of education: 12   Highest education level: High school graduate  Occupational History   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  Tobacco Use   Smoking status: Former    Types: Cigarettes    Quit date: 05/11/1975    Years since quitting: 47.4   Smokeless tobacco: Never  Vaping Use   Vaping Use: Never used  Substance and Sexual Activity   Alcohol use: Yes    Alcohol/week: 0.0 standard drinks of alcohol    Comment: 1 glass of wine every 2-3 weeks   Drug use: No   Sexual activity: Not Currently    Birth control/protection: Surgical  Other Topics Concern   Not on file  Social History Narrative   Not on file   Social Determinants of Health   Financial Resource Strain: Low Risk  (03/09/2019)   Overall Financial Resource Strain (CARDIA)    Difficulty of Paying Living Expenses: Not hard at all  Food Insecurity: No Food Insecurity (03/09/2019)   Hunger Vital Sign    Worried About Programme researcher, broadcasting/film/video in  the Last Year: Never true    Ran Out of Food in the Last Year: Never true  Transportation Needs: No Transportation Needs (03/09/2019)   PRAPARE - Administrator, Civil Service (Medical): No    Lack of Transportation (Non-Medical): No  Physical Activity: Inactive (03/09/2019)   Exercise Vital Sign    Days of Exercise per Week: 0 days    Minutes of Exercise per Session: 0 min  Stress: No Stress Concern Present (03/09/2019)   [employer] of Occupational Health - Occupational Stress Questionnaire    Feeling of Stress : Not at all  Social Connections: Moderately Integrated (03/09/2019)   Social Connection and Isolation Panel [NHANES]    Frequency of Communication with Friends and Family: More than three times a week    Frequency of Social Gatherings with  Friends and Family: More than three times a week    Attends Religious Services: More than 4 times per year    Active Member of [employer] or Organizations: Yes    Attends Banker Meetings: More than 4 times per year    Marital Status: Widowed  Intimate Partner Violence: Not At Risk (03/09/2019)   Humiliation, Afraid, Rape, and Kick questionnaire    Fear of Current or Ex-Partner: No    Emotionally Abused: No    Physically Abused: No    Sexually Abused: No    Family History  Problem Relation Age of Onset   Hypertension Mother    Stroke Mother        after 69   Hyperlipidemia Mother    Thyroid disease Mother    Hypertension Father    Cirrhosis Father    Alcohol abuse Father    Heart attack Sister 70   Hypertension Brother    Ovarian cancer Neg Hx    Breast cancer Neg Hx    Prostate cancer Neg Hx    Colon cancer Neg Hx        Review of Systems Constitutional: negative for anorexia, fevers and sweats  Eyes: negative for irritation, redness and visual disturbance  Ears, nose, mouth, throat, and face: negative for earaches, epistaxis, nasal congestion and sore throat  Respiratory: negative for cough, sputum and wheezing  Cardiovascular: negative for chest pain,  orthopnea, palpitations and syncope  Gastrointestinal: negative for abdominal pain, constipation, diarrhea, melena, nausea and vomiting  Genitourinary:negative for dysuria, frequency and hematuria  Hematologic/lymphatic: negative for bleeding, easy bruising and lymphadenopathy  Musculoskeletal:negative for arthralgias, muscle weakness  Neurological: negative for coordination problems, gait problems, headaches and weakness  Endocrine: negative for diabetic symptoms including polydipsia, polyuria and weight loss     Objective:   Physical Exam  Gen. Pleasant, obese, in no distress, normal affect ENT - no pallor,icterus, no post nasal drip, class 2-3 airway Neck: No JVD, no thyromegaly, no carotid  bruits Lungs: no use of accessory muscles, no dullness to percussion, decreased without rales or rhonchi  Cardiovascular: Rhythm regular, heart sounds  normal, no murmurs or gallops, no peripheral edema Abdomen: soft and non-tender, no hepatosplenomegaly, BS normal. Musculoskeletal: No deformities, no cyanosis or clubbing Neuro:  alert, non focal, no tremors       Assessment & Plan:

## 2022-09-25 LAB — BASIC METABOLIC PANEL
BUN/Creatinine Ratio: 19 (ref 12–28)
BUN: 17 mg/dL (ref 8–27)
CO2: 25 mmol/L (ref 20–29)
Calcium: 9.2 mg/dL (ref 8.7–10.3)
Chloride: 102 mmol/L (ref 96–106)
Creatinine, Ser: 0.91 mg/dL (ref 0.57–1.00)
Glucose: 91 mg/dL (ref 70–99)
Potassium: 4.5 mmol/L (ref 3.5–5.2)
Sodium: 141 mmol/L (ref 134–144)
eGFR: 66 mL/min/{1.73_m2} (ref >59)

## 2022-09-25 LAB — D-DIMER, QUANTITATIVE: D-DIMER: 1.78 mg/L FEU — ABNORMAL HIGH (ref 0.00–0.49)

## 2022-09-25 LAB — BRAIN NATRIURETIC PEPTIDE: BNP: 22.1 pg/mL (ref 0.0–100.0)

## 2022-09-29 ENCOUNTER — Ambulatory Visit (HOSPITAL_COMMUNITY)
Admission: RE | Admit: 2022-09-29 | Discharge: 2022-09-29 | Disposition: A | Discharge status: Home / Self Care | Payer: Medicare PPO | Source: Ambulatory Visit | Attending: Pulmonary Disease | Admitting: Pulmonary Disease

## 2022-09-29 ENCOUNTER — Ambulatory Visit (HOSPITAL_COMMUNITY): Admission: RE | Admit: 2022-09-29 | Payer: Medicare PPO | Source: Ambulatory Visit

## 2022-09-29 ENCOUNTER — Other Ambulatory Visit: Payer: Self-pay

## 2022-09-29 DIAGNOSIS — R7989 Other specified abnormal findings of blood chemistry: Secondary | ICD-10-CM | POA: Diagnosis not present

## 2022-09-29 MED ORDER — IOHEXOL 350 MG/ML SOLN
75.0000 mL | Freq: Once | INTRAVENOUS | Status: AC | PRN
  Administered 2022-09-29: 75 mL via INTRAVENOUS

## 2022-09-30 ENCOUNTER — Telehealth: Payer: Self-pay | Admitting: Pulmonary Disease

## 2022-09-30 DIAGNOSIS — I2693 Single subsegmental pulmonary embolism without acute cor pulmonale: Secondary | ICD-10-CM

## 2022-09-30 NOTE — Telephone Encounter (Signed)
Reviewed CT scan results with her which showed right lower lobe segmental pulm embolism.  She was followed by Dr. Mathews Robinsons last night and asked to go to [facility] ER. She has been prescribed Eliquis -I reviewed dosing with her and asked her to start taking loading dose 10 mg twice daily and after 1 week decrease to 5 mg twice daily.  She understood these instructions. We reviewed possible side effects of anticoagulation.  Please schedule venous duplex both lower extremities for her. Also schedule office visit with me/APP in 1 to 2 weeks to review how she is doing

## 2022-09-30 NOTE — Telephone Encounter (Signed)
Pt. LVM with answering service and wanted to go over her Pet scan results I called her and told her it might take a few days to get back with her but a nurse would call if there were any concerns

## 2022-09-30 NOTE — Telephone Encounter (Signed)
CALL REPORT  

## 2022-10-05 ENCOUNTER — Telehealth: Payer: Self-pay | Admitting: Pulmonary Disease

## 2022-10-05 ENCOUNTER — Other Ambulatory Visit: Payer: Self-pay

## 2022-10-05 DIAGNOSIS — R7989 Other specified abnormal findings of blood chemistry: Secondary | ICD-10-CM

## 2022-10-06 NOTE — Telephone Encounter (Addendum)
Refer to phone encounter from 5/28.

## 2022-10-06 NOTE — Telephone Encounter (Signed)
Attempted to call pt to see if we could get her scheduled for an appt with Dr. Vassie Loll but unable to reach. Left message for her to return call.  Per phone encounter 5/23 which was a call report, Dr. Vassie Loll wanted pt to be seen in 1-2 weeks to see how she was doing.  When pt calls back, please schedule pt a sooner appt with either Dr. Vassie Loll or APP. Dr. Vassie Loll does have openings 5/30.

## 2022-10-06 NOTE — Telephone Encounter (Signed)
Patient has been scheduled for 5/30 10:30 nothing further needed.

## 2022-10-07 ENCOUNTER — Ambulatory Visit (HOSPITAL_BASED_OUTPATIENT_CLINIC_OR_DEPARTMENT_OTHER): Payer: Medicare PPO | Admitting: Pulmonary Disease

## 2022-10-07 ENCOUNTER — Encounter (HOSPITAL_BASED_OUTPATIENT_CLINIC_OR_DEPARTMENT_OTHER): Payer: Self-pay | Admitting: Pulmonary Disease

## 2022-10-07 VITALS — BP 120/66 | HR 71 | Temp 98.0°F | Ht 68.0 in | Wt 261.4 lb

## 2022-10-07 DIAGNOSIS — I2693 Single subsegmental pulmonary embolism without acute cor pulmonale: Secondary | ICD-10-CM

## 2022-10-07 DIAGNOSIS — I2699 Other pulmonary embolism without acute cor pulmonale: Secondary | ICD-10-CM | POA: Insufficient documentation

## 2022-10-07 MED ORDER — APIXABAN 2.5 MG PO TABS: 2.5000 mg | ORAL_TABLET | Freq: Two times a day (BID) | ORAL | 2 refills | Status: DC

## 2022-10-07 NOTE — Assessment & Plan Note (Signed)
Appears unprovoked but segmental. Will obtain venous duplex and echocardiogram but do not expect apparent RV strain. Not clear whether the small segmental pulmonary embolus could be the cause of her significant dyspnea.  Will see if she has improvement with anticoagulation. Would suggest at least 3 to 6 months of anticoagulation and then reassess

## 2022-10-07 NOTE — Patient Instructions (Addendum)
x schedule echocardiogram  Await Doppler test next week for your legs.  xContinue on Eliquis , refill x 2

## 2022-10-07 NOTE — Progress Notes (Signed)
   Subjective:    Patient ID: Kim Wilkerson, female    DOB: [birth year], 77 y.o.   MRN: [id number]  HPI  77 year old never smoker for follow-up of shortness of breath and OSA she had wt gain from baseline of 238 to a peak of 297 as of 06/29/2021 .   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  Prior echo has shown normal function. 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 She has a remote history of DVT,  duplex was neg 10/2021 -Hypertension Type 2 diabetes OSA was diagnosed by [facility] neurology, she is maintained on CPAP with a fullface mask, DME is [employer] She is a remote smoker and smoked less than 10 pack years before she quit in 1977    Initial OV 09/23/22 She had an elevated D-dimer and CT angiogram chest showed acute pulm embolism in right lower lobe segmental artery.  She was sent to [facility] ED after: Report and was started on Eliquis.  She has completed loading dose for a week and has 60 pills left.  She is tolerating this well and denies any side effects. She has not really tested her breathing because she has not been doing much over the last few days. Weight is unchanged, she has chronic pedal edema  Significant tests/ events reviewed   NPSG 04/2020 moderate, AHI 16/hour, RDI 29/hour, low sat 81%, mild PLM's 09/2021 echo normal LV function  Review of Systems neg for any significant sore throat, dysphagia, itching, sneezing, nasal congestion or excess/ purulent secretions, fever, chills, sweats, unintended wt loss, pleuritic or exertional cp, hempoptysis, orthopnea pnd or change in chronic leg swelling. Also denies presyncope, palpitations, heartburn, abdominal pain, nausea, vomiting, diarrhea or change in bowel or urinary habits, dysuria,hematuria, rash, arthralgias, visual complaints, headache, numbness weakness or ataxia.     Objective:   Physical Exam  Gen. Pleasant, obese, in no  distress ENT - no lesions, no post nasal drip Neck: No JVD, no thyromegaly, no carotid bruits Lungs: no use of accessory muscles, no dullness to percussion, decreased without rales or rhonchi  Cardiovascular: Rhythm regular, heart sounds  normal, no murmurs or gallops, chronic lymphedema Musculoskeletal: No deformities, no cyanosis or clubbing , no tremors       Assessment & Plan:

## 2022-10-11 ENCOUNTER — Ambulatory Visit (HOSPITAL_COMMUNITY)
Admission: RE | Admit: 2022-10-11 | Discharge: 2022-10-11 | Disposition: A | Discharge status: Home / Self Care | Payer: Medicare PPO | Source: Ambulatory Visit | Attending: Pulmonary Disease | Admitting: Pulmonary Disease

## 2022-10-11 DIAGNOSIS — R7989 Other specified abnormal findings of blood chemistry: Secondary | ICD-10-CM | POA: Insufficient documentation

## 2022-10-12 ENCOUNTER — Telehealth (HOSPITAL_BASED_OUTPATIENT_CLINIC_OR_DEPARTMENT_OTHER): Payer: Self-pay | Admitting: Pulmonary Disease

## 2022-10-12 ENCOUNTER — Telehealth: Payer: Self-pay | Admitting: Family Medicine

## 2022-10-12 NOTE — Telephone Encounter (Signed)
Patient would like to speak to a nurse regarding pain in her right leg she is having since Dr. Vassie Loll has ordered an ultrasound of her legs to check for clots. Patient states her right leg hurts to bend, straighten and put weight on. Was wondering if there is anything Dr. Vassie Loll could do to give her some relief. Please advise and call patient back.

## 2022-10-12 NOTE — Telephone Encounter (Signed)
Pt recently had doppler study performed. Routing encounter to Dr. Vassie Loll for him to review and advise recommendations.

## 2022-10-12 NOTE — Telephone Encounter (Signed)
Thanks Dr Vassie Loll.  She sees an orthopedist, Dr Emilia Beck so I will include him on the conversation to continue following her knees.

## 2022-10-13 ENCOUNTER — Ambulatory Visit (INDEPENDENT_AMBULATORY_CARE_PROVIDER_SITE_OTHER): Payer: Medicare PPO

## 2022-10-13 ENCOUNTER — Telehealth: Payer: Self-pay | Admitting: Pulmonary Disease

## 2022-10-13 VITALS — Ht 68.0 in | Wt 258.0 lb

## 2022-10-13 DIAGNOSIS — Z1231 Encounter for screening mammogram for malignant neoplasm of breast: Secondary | ICD-10-CM

## 2022-10-13 DIAGNOSIS — Z0000 Encounter for general adult medical examination without abnormal findings: Secondary | ICD-10-CM

## 2022-10-13 NOTE — Telephone Encounter (Signed)
Kim Milch, MD  You; Delynn Flavin M, DO; Jaynee Eagles, [place] hours ago (5:11 PM)    Doppler was negative for DVT, see result note. 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 Left message for her to return call. Routing to [place] pool for follow up.

## 2022-10-13 NOTE — Progress Notes (Signed)
Subjective:   Kim Wilkerson is a 77 y.o. female who presents for Medicare Annual (Subsequent) preventive examination. I connected with  Kim Wilkerson on 10/13/22 by a audio enabled telemedicine application and verified that I am speaking with the correct person using two identifiers.  Patient Location: Home  Provider Location: Home Office  I discussed the limitations of evaluation and management by telemedicine. The patient expressed understanding and agreed to proceed.  Review of Systems     Cardiac Risk Factors include: advanced age (>89men, >52 women);diabetes mellitus;dyslipidemia;hypertension     Objective:    Today's Vitals   10/13/22 0821  Weight: 258 lb (117 kg)  Height: 5\' 8"  (1.727 m)   Body mass index is 39.23 kg/m.     10/13/2022    8:24 AM 05/08/2021    8:32 AM 03/09/2019    8:38 AM 07/01/2017   12:16 PM  Advanced Directives  Does Patient Have a Medical Advance Directive? No No No No  Would patient like information on creating a medical advance directive? No - Patient declined No - Patient declined No - Patient declined No - Patient declined    Current Medications (verified) Outpatient Encounter Medications as of 10/13/2022  Medication Sig   acetaminophen (TYLENOL) 650 MG CR tablet Take 650 mg by mouth every 8 (eight) hours as needed for pain.   Alcohol Swabs (B-D SINGLE USE SWABS REGULAR) PADS Check BGs daily Dx E11.9   apixaban (ELIQUIS) 2.5 MG TABS tablet Take 1 tablet (2.5 mg total) by mouth 2 (two) times daily.   Ascorbic Acid 53 MG LOZG Take by mouth.   atorvastatin (LIPITOR) 40 MG tablet TAKE 1 TABLET (40 MG TOTAL) BY MOUTH DAILY.   Blood Glucose Calibration (TRUE METRIX LEVEL 1) Low SOLN Use w/ glucose meter Dx E11.9   Blood Glucose Monitoring Suppl (TRUE METRIX AIR GLUCOSE METER) w/Device KIT Check BGs daily Dx E11.9   cyclobenzaprine (FLEXERIL) 5 MG tablet Take 1 tablet (5 mg total) by mouth 3 (three) times daily as needed for muscle spasms.    diclofenac Sodium (VOLTAREN) 1 % GEL Apply 4 g topically 4 (four) times daily. As needed for joint pain   diltiazem (CARDIZEM CD) 240 MG 24 hr capsule TAKE 1 CAPSULE EVERY DAY   famotidine (PEPCID) 40 MG tablet TAKE 1 TABLET TWICE DAILY   furosemide (LASIX) 40 MG tablet TAKE 1 TABLET EVERY DAY   glucose blood (TRUE METRIX BLOOD GLUCOSE TEST) test strip Check BGs daily Dx E11.9   ibuprofen (ADVIL) 800 MG tablet Take 0.5-1 tablets (400-800 mg total) by mouth 2 (two) times daily as needed for moderate pain.   Lancet Device MISC UAD to check BGs. E11.9. May substitute to any manufacturer covered by patient's insurance.   Lancets Misc. MISC Check BGs daily. E11.9. May substitute to any manufacturer covered by patient's insurance.   lansoprazole (PREVACID) 30 MG capsule TAKE 1 CAPSULE EVERY DAY AT 12 NOON.   losartan (COZAAR) 50 MG tablet TAKE 1 TABLET EVERY DAY   Semaglutide,0.25 or 0.5MG /DOS, (OZEMPIC, 0.25 OR 0.5 MG/DOSE,) 2 MG/3ML SOPN Inject 0.5 mg into the skin every 7 (seven) days.   traMADol (ULTRAM) 50 MG tablet Take 1 tablet (50 mg total) by mouth 3 (three) times daily as needed.   TRUEplus Lancets 33G MISC Check BGs daily Dx E11.9   No facility-administered encounter medications on file as of 10/13/2022.    Allergies (verified) Metformin and related   History: Past Medical History:  Diagnosis  Date   Arthritis    Functional heart murmur 2019   Hypertension 1980   Leg ulcer (HCC)    Morbid obesity (HCC)    Varicose veins    Past Surgical History:  Procedure Laterality Date   ABDOMINAL HYSTERECTOMY  1994   COLONOSCOPY N/A 07/01/2017   Procedure: COLONOSCOPY;  Surgeon: West Bali, MD;  Location: [facility];  Service: Endoscopy;  Laterality: N/A;  1:00   ESOPHAGEAL DILATION  11/2016   Dr Allena Katz in [place]   Family History  Problem Relation Age of Onset   Hypertension Mother    Stroke Mother        after 85   Hyperlipidemia Mother    Thyroid disease Mother     Hypertension Father    Cirrhosis Father    Alcohol abuse Father    Heart attack Sister 103   Hypertension Brother    Ovarian cancer Neg Hx    Breast cancer Neg Hx    Prostate cancer Neg Hx    Colon cancer Neg Hx    Social History   Socioeconomic History   Marital status: Widowed    Spouse name: Not on file   Number of children: 3   Years of education: 12   Highest education level: High school graduate  Occupational History   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  Tobacco Use   Smoking status: Former    Types: Cigarettes    Quit date: 05/11/1975    Years since quitting: 47.4   Smokeless tobacco: Never  Vaping Use   Vaping Use: Never used  Substance and Sexual Activity   Alcohol use: Yes    Alcohol/week: 0.0 standard drinks of alcohol    Comment: 1 glass of wine every 2-3 weeks   Drug use: No   Sexual activity: Not Currently    Birth control/protection: Surgical  Other Topics Concern   Not on file  Social History Narrative   Not on file   Social Determinants of Health   Financial Resource Strain: Low Risk  (10/13/2022)   Overall Financial Resource Strain (CARDIA)    Difficulty of Paying Living Expenses: Not hard at all  Food Insecurity: No Food Insecurity (10/13/2022)   Hunger Vital Sign    Worried About Running Out of Food in the Last Year: Never true    Ran Out of Food in the Last Year: Never true  Transportation Needs: No Transportation Needs (10/13/2022)   PRAPARE - Administrator, Civil Service (Medical): No    Lack of Transportation (Non-Medical): No  Physical Activity: Inactive (10/13/2022)   Exercise Vital Sign    Days of Exercise per Week: 0 days    Minutes of Exercise per Session: 0 min  Stress: No Stress Concern Present (10/13/2022)   [employer] of Occupational Health - Occupational Stress Questionnaire    Feeling of Stress : Not at all  Social Connections: Moderately Isolated (10/13/2022)   Social Connection and Isolation Panel  [NHANES]    Frequency of Communication with Friends and Family: More than three times a week    Frequency of Social Gatherings with Friends and Family: More than three times a week    Attends Religious Services: More than 4 times per year    Active Member of [employer] or Organizations: No    Attends Banker Meetings: Never    Marital Status: Widowed    Tobacco Counseling Counseling given: Not Answered   Clinical Intake:  Pre-visit preparation completed: Yes  Pain : No/denies pain     Nutritional Risks: None Diabetes: Yes CBG done?: No Did pt. bring in CBG monitor from home?: No  How often do you need to have someone help you when you read instructions, pamphlets, or other written materials from your doctor or pharmacy?: 1 - Never  Diabetic?yes  Nutrition Risk Assessment:  Has the patient had any N/V/D within the last 2 months?  No  Does the patient have any non-healing wounds?  No  Has the patient had any unintentional weight loss or weight gain?  No   Diabetes:  Is the patient diabetic?  Yes  If diabetic, was a CBG obtained today?  No  Did the patient bring in their glucometer from home?  No  How often do you monitor your CBG's? Once a day .   Financial Strains and Diabetes Management:  Are you having any financial strains with the device, your supplies or your medication? No .  Does the patient want to be seen by Chronic Care Management for management of their diabetes?  No  Would the patient like to be referred to a Nutritionist or for Diabetic Management?  No   Diabetic Exams:  Diabetic Eye Exam: Overdue for diabetic eye exam. Pt has been advised about the importance in completing this exam. Patient advised to call and schedule an eye exam. Diabetic Foot Exam: Overdue, Pt has been advised about the importance in completing this exam. Pt is scheduled for diabetic foot exam on next office visit .   Interpreter Needed?: No  Information entered by ::  Renie Ora, LPN   Activities of Daily Living    10/13/2022    8:24 AM  In your present state of health, do you have any difficulty performing the following activities:  Hearing? 0  Vision? 0  Difficulty concentrating or making decisions? 0  Walking or climbing stairs? 0  Dressing or bathing? 0  Doing errands, shopping? 0  Preparing Food and eating ? N  Using the Toilet? N  In the past six months, have you accidently leaked urine? N  Do you have problems with loss of bowel control? N  Managing your Medications? N  Managing your Finances? N  Housekeeping or managing your Housekeeping? N    Patient Care Team: Raliegh Ip, DO as PCP - General (Family Medicine) West Bali, MD (Inactive) as Consulting Physician (Gastroenterology)  Indicate any recent Medical Services you may have received from other than [facility] providers in the past year (date may be approximate).     Assessment:   This is a routine wellness examination for [facility].  Hearing/Vision screen Vision Screening - Comments:: Unknown per patient   Dietary issues and exercise activities discussed: Current Exercise Habits: The patient does not participate in regular exercise at present   Goals Addressed             This Visit's Progress    DIET - INCREASE WATER INTAKE   On track    Try to drink 6-8 glasses of water daily       Depression Screen    10/13/2022    8:23 AM 08/04/2022   10:13 AM 08/04/2022   10:02 AM 07/06/2022   10:12 AM 10/27/2021    9:42 AM 10/06/2021    1:42 PM 09/23/2021    9:53 AM  PHQ 2/9 Scores  PHQ - 2 Score 0 0 0 0 0 2 0  PHQ- 9 Score  7  0 0 0 9     Fall Risk    10/13/2022    8:22 AM 07/06/2022   10:12 AM 10/06/2021    1:42 PM 06/24/2021    9:42 AM 05/08/2021    8:32 AM  Fall Risk   Falls in the past year? 0 0 1 1 1   Number falls in past yr: 0 0 1 1 1   Injury with Fall? 0 0 1 0 0  Risk for fall due to : No Fall Risks No Fall Risks History of fall(s) History of fall(s)  History of fall(s);Impaired balance/gait  Follow up Falls prevention discussed Education provided Education provided Education provided Falls evaluation completed    FALL RISK PREVENTION PERTAINING TO THE HOME:  Any stairs in or around the home? Yes  If so, are there any without handrails? Yes  Home free of loose throw rugs in walkways, pet beds, electrical cords, etc? Yes  Adequate lighting in your home to reduce risk of falls? Yes   ASSISTIVE DEVICES UTILIZED TO PREVENT FALLS:  Life alert? No  Use of a cane, walker or w/c? Yes  Grab bars in the bathroom? Yes  Shower chair or bench in shower? Yes  Elevated toilet seat or a handicapped toilet? Yes        10/13/2022    8:24 AM 05/08/2021    8:37 AM 03/09/2019    8:43 AM  6CIT Screen  What Year? 0 points 0 points 0 points  What month? 0 points 0 points 0 points  What time? 0 points 0 points 0 points  Count back from 20 0 points 0 points 0 points  Months in reverse 0 points 0 points 0 points  Repeat phrase 0 points 2 points 0 points  Total Score 0 points 2 points 0 points    Immunizations Immunization History  Administered Date(s) Administered   Influenza, High Dose Seasonal PF 05/09/2018   Influenza,inj,Quad PF,6+ Mos 05/26/2020   PFIZER(Purple Top)SARS-COV-2 Vaccination 08/06/2019, 09/03/2019, 05/08/2020   Pneumococcal Conjugate-13 07/08/2020    TDAP status: Due, Education has been provided regarding the importance of this vaccine. Advised may receive this vaccine at local pharmacy or Health Dept. Aware to provide a copy of the vaccination record if obtained from local pharmacy or Health Dept. Verbalized acceptance and understanding.  Flu Vaccine status: Declined, Education has been provided regarding the importance of this vaccine but patient still declined. Advised may receive this vaccine at local pharmacy or Health Dept. Aware to provide a copy of the vaccination record if obtained from local pharmacy or Health Dept.  Verbalized acceptance and understanding.  Pneumococcal vaccine status: Due, Education has been provided regarding the importance of this vaccine. Advised may receive this vaccine at local pharmacy or Health Dept. Aware to provide a copy of the vaccination record if obtained from local pharmacy or Health Dept. Verbalized acceptance and understanding.  Covid-19 vaccine status: Completed vaccines  Qualifies for Shingles Vaccine? Yes   Zostavax completed No   Shingrix Completed?: No.    Education has been provided regarding the importance of this vaccine. Patient has been advised to call insurance company to determine out of pocket expense if they have not yet received this vaccine. Advised may also receive vaccine at local pharmacy or Health Dept. Verbalized acceptance and understanding.  Screening Tests Health Maintenance  Topic Date Due   OPHTHALMOLOGY EXAM  Never done   Diabetic kidney evaluation - Urine ACR  Never done   Zoster Vaccines- Shingrix (1 of  2) Never done   COVID-19 Vaccine (4 - 2023-24 season) 01/08/2022   Pneumonia Vaccine 46+ Years old (2 of 2 - PPSV23 or PCV20) 07/07/2023 (Originally 09/02/2020)   MAMMOGRAM  08/04/2023 (Originally 12/04/2020)   INFLUENZA VACCINE  12/09/2022   HEMOGLOBIN A1C  01/04/2023   FOOT EXAM  08/04/2023   Diabetic kidney evaluation - eGFR measurement  09/23/2023   Medicare Annual Wellness (AWV)  10/13/2023   DEXA SCAN  08/11/2024   Colonoscopy  07/02/2027   Hepatitis C Screening  Completed   HPV VACCINES  Aged Out   DTaP/Tdap/Td  Discontinued    Health Maintenance  Health Maintenance Due  Topic Date Due   OPHTHALMOLOGY EXAM  Never done   Diabetic kidney evaluation - Urine ACR  Never done   Zoster Vaccines- Shingrix (1 of 2) Never done   COVID-19 Vaccine (4 - 2023-24 season) 01/08/2022    Colorectal cancer screening: Type of screening: Colonoscopy. Completed 07/01/2017. Repeat every 10 years  Mammogram status: Ordered 10/13/2022. Pt  provided with contact info and advised to call to schedule appt.   Bone Density status: Completed 04/014/2024. Results reflect: Bone density results: OSTEOPOROSIS. Repeat every 2 years.  Lung Cancer Screening: (Low Dose CT Chest recommended if Age 55-80 years, 30 pack-year currently smoking OR have quit w/in 15years.) does not qualify.   Lung Cancer Screening Referral: n/a  Additional Screening:  Hepatitis C Screening: does not qualify; Completed 05/20/2017  Vision Screening: Recommended annual ophthalmology exams for early detection of glaucoma and other disorders of the eye. Is the patient up to date with their annual eye exam?  No  Who is the provider or what is the name of the office in which the patient attends annual eye exams? Unknown per patient  If pt is not established with a provider, would they like to be referred to a provider to establish care? No .   Dental Screening: Recommended annual dental exams for proper oral hygiene  Community Resource Referral / Chronic Care Management: CRR required this visit?  No   CCM required this visit?  No      Plan:     I have personally reviewed and noted the following in the patient's chart:   Medical and social history Use of alcohol, tobacco or illicit drugs  Current medications and supplements including opioid prescriptions. Patient is not currently taking opioid prescriptions. Functional ability and status Nutritional status Physical activity Advanced directives List of other physicians Hospitalizations, surgeries, and ER visits in previous 12 months Vitals Screenings to include cognitive, depression, and falls Referrals and appointments  In addition, I have reviewed and discussed with patient certain preventive protocols, quality metrics, and best practice recommendations. A written personalized care plan for preventive services as well as general preventive health recommendations were provided to patient.     Lorrene Reid, LPN   05/15/1094   Nurse Notes: Due TDAP Yevonne Aline Vaccine

## 2022-10-13 NOTE — Patient Instructions (Signed)
Ms. Schlader , Thank you for taking time to come for your Medicare Wellness Visit. I appreciate your ongoing commitment to your health goals. Please review the following plan we discussed and let me know if I can assist you in the future.   These are the goals we discussed:  Goals      DIET - INCREASE WATER INTAKE     Try to drink 6-8 glasses of water daily     Exercise 150 min/wk Moderate Activity     Have 3 meals a day     Prevent falls        This is a list of the screening recommended for you and due dates:  Health Maintenance  Topic Date Due   Eye exam for diabetics  Never done   Yearly kidney health urinalysis for diabetes  Never done   Zoster (Shingles) Vaccine (1 of 2) Never done   COVID-19 Vaccine (4 - 2023-24 season) 01/08/2022   Pneumonia Vaccine (2 of 2 - PPSV23 or PCV20) 07/07/2023*   Mammogram  08/04/2023*   Flu Shot  12/09/2022   Hemoglobin A1C  01/04/2023   Complete foot exam   08/04/2023   Yearly kidney function blood test for diabetes  09/23/2023   Medicare Annual Wellness Visit  10/13/2023   DEXA scan (bone density measurement)  08/11/2024   Colon Cancer Screening  07/02/2027   Hepatitis C Screening  Completed   HPV Vaccine  Aged Out   DTaP/Tdap/Td vaccine  Discontinued  *Topic was postponed. The date shown is not the original due date.    Advanced directives: Advance directive discussed with you today. I have provided a copy for you to complete at home and have notarized. Once this is complete please bring a copy in to our office so we can scan it into your chart.   Conditions/risks identified: Aim for 30 minutes of exercise or brisk walking, 6-8 glasses of water, and 5 servings of fruits and vegetables each day.   Next appointment: Follow up in one year for your annual wellness visit    Preventive Care 65 Years and Older, Female Preventive care refers to lifestyle choices and visits with your health care provider that can promote health and  wellness. What does preventive care include? A yearly physical exam. This is also called an annual well check. Dental exams once or twice a year. Routine eye exams. Ask your health care provider how often you should have your eyes checked. Personal lifestyle choices, including: Daily care of your teeth and gums. Regular physical activity. Eating a healthy diet. Avoiding tobacco and drug use. Limiting alcohol use. Practicing safe sex. Taking low-dose aspirin every day. Taking vitamin and mineral supplements as recommended by your health care provider. What happens during an annual well check? The services and screenings done by your health care provider during your annual well check will depend on your age, overall health, lifestyle risk factors, and family history of disease. Counseling  Your health care provider may ask you questions about your: Alcohol use. Tobacco use. Drug use. Emotional well-being. Home and relationship well-being. Sexual activity. Eating habits. History of falls. Memory and ability to understand (cognition). Work and work Astronomer. Reproductive health. Screening  You may have the following tests or measurements: Height, weight, and BMI. Blood pressure. Lipid and cholesterol levels. These may be checked every 5 years, or more frequently if you are over 24 years old. Skin check. Lung cancer screening. You may have this screening every year  starting at age 41 if you have a 30-pack-year history of smoking and currently smoke or have quit within the past 15 years. Fecal occult blood test (FOBT) of the stool. You may have this test every year starting at age 56. Flexible sigmoidoscopy or colonoscopy. You may have a sigmoidoscopy every 5 years or a colonoscopy every 10 years starting at age 4. Hepatitis C blood test. Hepatitis B blood test. Sexually transmitted disease (STD) testing. Diabetes screening. This is done by checking your blood sugar (glucose)  after you have not eaten for a while (fasting). You may have this done every 1-3 years. Bone density scan. This is done to screen for osteoporosis. You may have this done starting at age 51. Mammogram. This may be done every 1-2 years. Talk to your health care provider about how often you should have regular mammograms. Talk with your health care provider about your test results, treatment options, and if necessary, the need for more tests. Vaccines  Your health care provider may recommend certain vaccines, such as: Influenza vaccine. This is recommended every year. Tetanus, diphtheria, and acellular pertussis (Tdap, Td) vaccine. You may need a Td booster every 10 years. Zoster vaccine. You may need this after age 66. Pneumococcal 13-valent conjugate (PCV13) vaccine. One dose is recommended after age 100. Pneumococcal polysaccharide (PPSV23) vaccine. One dose is recommended after age 25. Talk to your health care provider about which screenings and vaccines you need and how often you need them. This information is not intended to replace advice given to you by your health care provider. Make sure you discuss any questions you have with your health care provider. Document Released: 05/23/2015 Document Revised: 01/14/2016 Document Reviewed: 02/25/2015 Elsevier Interactive Patient Education  2017 [employer].  Fall Prevention in the Home Falls can cause injuries. They can happen to people of all ages. There are many things you can do to make your home safe and to help prevent falls. What can I do on the outside of my home? Regularly fix the edges of walkways and driveways and fix any cracks. Remove anything that might make you trip as you walk through a door, such as a raised step or threshold. Trim any bushes or trees on the path to your home. Use bright outdoor lighting. Clear any walking paths of anything that might make someone trip, such as rocks or tools. Regularly check to see if  handrails are loose or broken. Make sure that both sides of any steps have handrails. Any raised decks and porches should have guardrails on the edges. Have any leaves, snow, or ice cleared regularly. Use sand or salt on walking paths during winter. Clean up any spills in your garage right away. This includes oil or grease spills. What can I do in the bathroom? Use night lights. Install grab bars by the toilet and in the tub and shower. Do not use towel bars as grab bars. Use non-skid mats or decals in the tub or shower. If you need to sit down in the shower, use a plastic, non-slip stool. Keep the floor dry. Clean up any water that spills on the floor as soon as it happens. Remove soap buildup in the tub or shower regularly. Attach bath mats securely with double-sided non-slip rug tape. Do not have throw rugs and other things on the floor that can make you trip. What can I do in the bedroom? Use night lights. Make sure that you have a light by your bed that is  easy to reach. Do not use any sheets or blankets that are too big for your bed. They should not hang down onto the floor. Have a firm chair that has side arms. You can use this for support while you get dressed. Do not have throw rugs and other things on the floor that can make you trip. What can I do in the kitchen? Clean up any spills right away. Avoid walking on wet floors. Keep items that you use a lot in easy-to-reach places. If you need to reach something above you, use a strong step stool that has a grab bar. Keep electrical cords out of the way. Do not use floor polish or wax that makes floors slippery. If you must use wax, use non-skid floor wax. Do not have throw rugs and other things on the floor that can make you trip. What can I do with my stairs? Do not leave any items on the stairs. Make sure that there are handrails on both sides of the stairs and use them. Fix handrails that are broken or loose. Make sure that  handrails are as long as the stairways. Check any carpeting to make sure that it is firmly attached to the stairs. Fix any carpet that is loose or worn. Avoid having throw rugs at the top or bottom of the stairs. If you do have throw rugs, attach them to the floor with carpet tape. Make sure that you have a light switch at the top of the stairs and the bottom of the stairs. If you do not have them, ask someone to add them for you. What else can I do to help prevent falls? Wear shoes that: Do not have high heels. Have rubber bottoms. Are comfortable and fit you well. Are closed at the toe. Do not wear sandals. If you use a stepladder: Make sure that it is fully opened. Do not climb a closed stepladder. Make sure that both sides of the stepladder are locked into place. Ask someone to hold it for you, if possible. Clearly mark and make sure that you can see: Any grab bars or handrails. First and last steps. Where the edge of each step is. Use tools that help you move around (mobility aids) if they are needed. These include: Canes. Walkers. Scooters. Crutches. Turn on the lights when you go into a dark area. Replace any light bulbs as soon as they burn out. Set up your furniture so you have a clear path. Avoid moving your furniture around. If any of your floors are uneven, fix them. If there are any pets around you, be aware of where they are. Review your medicines with your doctor. Some medicines can make you feel dizzy. This can increase your chance of falling. Ask your doctor what other things that you can do to help prevent falls. This information is not intended to replace advice given to you by your health care provider. Make sure you discuss any questions you have with your health care provider. Document Released: 02/20/2009 Document Revised: 10/02/2015 Document Reviewed: 05/31/2014 Elsevier Interactive Patient Education  2017 [employer].

## 2022-10-13 NOTE — Telephone Encounter (Signed)
PT calling for results of her leg scan for clots. States they (where she had the test done)  told her the Dr. would get the results yesterday and call her today. Please call to advise if based on the findings can Dr. Vassie Loll help her or will she be referred. She is in a lot of pain. Can't even walk on left knee.   Her # is [phone]

## 2022-10-15 NOTE — Telephone Encounter (Signed)
Called and spoke w/ pt - she verbalized understanding of RA message NFN att.

## 2022-10-18 NOTE — Telephone Encounter (Signed)
RA, please advise.  

## 2022-10-19 ENCOUNTER — Ambulatory Visit: Payer: Medicare PPO | Admitting: Nurse Practitioner

## 2022-10-19 ENCOUNTER — Ambulatory Visit: Payer: Medicare PPO | Admitting: Family Medicine

## 2022-10-21 NOTE — Telephone Encounter (Signed)
Please call PT back to advise of Dr's comments.

## 2022-10-27 NOTE — Telephone Encounter (Signed)
October 18, 2022 Kim Milch, MD  to Kim Wilkerson, [facility]     10/18/22 11:25 AM Please see phone note 6/4  Doppler was negative for DVT, see result note. 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

## 2022-11-17 ENCOUNTER — Ambulatory Visit: Payer: Medicare PPO | Admitting: Family Medicine

## 2022-11-22 ENCOUNTER — Ambulatory Visit (HOSPITAL_BASED_OUTPATIENT_CLINIC_OR_DEPARTMENT_OTHER): Payer: Medicare PPO

## 2022-11-22 DIAGNOSIS — I2693 Single subsegmental pulmonary embolism without acute cor pulmonale: Secondary | ICD-10-CM

## 2022-11-22 LAB — ECHOCARDIOGRAM COMPLETE
AR max vel: 2.49 cm2
AV Area VTI: 2.42 cm2
AV Area mean vel: 2.27 cm2
AV Mean grad: 4 mmHg
AV Peak grad: 6.8 mmHg
Ao pk vel: 1.3 m/s
Area-P 1/2: 3.89 cm2
Calc EF: 51.9 %
S' Lateral: 3.99 cm
Single Plane A2C EF: 48.9 %
Single Plane A4C EF: 53.1 %

## 2022-11-25 LAB — HM DIABETES EYE EXAM

## 2022-12-09 ENCOUNTER — Encounter (HOSPITAL_BASED_OUTPATIENT_CLINIC_OR_DEPARTMENT_OTHER): Payer: Self-pay | Admitting: Pulmonary Disease

## 2022-12-09 ENCOUNTER — Ambulatory Visit (HOSPITAL_BASED_OUTPATIENT_CLINIC_OR_DEPARTMENT_OTHER): Payer: Medicare PPO | Admitting: Pulmonary Disease

## 2022-12-09 VITALS — BP 120/78 | HR 71 | Ht 68.0 in | Wt 259.6 lb

## 2022-12-09 DIAGNOSIS — I2693 Single subsegmental pulmonary embolism without acute cor pulmonale: Secondary | ICD-10-CM | POA: Diagnosis not present

## 2022-12-09 NOTE — Assessment & Plan Note (Signed)
I provided her with assistance forms for Eliquis.  If she is not eligible we can look at alternatives for anticoagulation while she is in the donut hole. I explained that this was an unprovoked event.  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  As such she may need long-term anticoagulation. She is also planning for knee surgery

## 2022-12-09 NOTE — Patient Instructions (Signed)
x continue on Eliquis. Refills as needed

## 2022-12-09 NOTE — Progress Notes (Signed)
   Subjective:    Patient ID: Kim Wilkerson, female    DOB: 20-May-1945, 77 y.o.   MRN: [id number]  HPI  77 yo never smoker for follow-up of shortness of breath and OSA she had wt gain from baseline of 238 to a peak of 297 .   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 -Hypertension Type 2 diabetes OSA was diagnosed by [facility] neurology, she is maintained on CPAP with a fullface mask, DME is [employer] She is a remote smoker and smoked less than 10 pack years before she quit in 1977  10-month follow-up visit. Weight is unchanged.  She reports dyspnea has improved.  She continues on Eliquis but cost is increased because she is in the donut hole.  We reviewed echo which showed decreased RV function and venous Doppler showing Baker's cyst.  She had severe bilateral knee pain and she went to orthopedics and got Baker's cyst aspirated and this improved her mobility significantly She is compliant with CPAP and denies any problems with mask or pressure  Significant tests/ events reviewed   NPSG 04/2020 moderate, AHI 16/hour, RDI 29/hour, low sat 81%, mild PLM's 11/2022 echo normal LV function, RV SF mildly decreased, RVSP 49  10/2022 venous doppler neg DVT, bakers cyst    Review of Systems neg for any significant sore throat, dysphagia, itching, sneezing, nasal congestion or excess/ purulent secretions, fever, chills, sweats, unintended wt loss, pleuritic or exertional cp, hempoptysis, orthopnea pnd or change in chronic leg swelling. Also denies presyncope, palpitations, heartburn, abdominal pain, nausea, vomiting, diarrhea or change in bowel or urinary habits, dysuria,hematuria, rash, arthralgias, visual  complaints, headache, numbness weakness or ataxia.     Objective:   Physical Exam  Gen. Pleasant, obese, in no distress ENT - no lesions, no post nasal drip Neck: No JVD, no thyromegaly, no carotid bruits Lungs: no use of accessory muscles, no dullness to percussion, decreased without rales or rhonchi  Cardiovascular: Rhythm regular, heart sounds  normal, no murmurs or gallops, no peripheral edema Musculoskeletal: No deformities, no cyanosis or clubbing , no tremors       Assessment & Plan:   Preop clearance-she would be cleared for knee surgery since she has completed 2 months of anticoagulation.  Eliquis can be stopped for 2 days prior to surgery and resumed immediately after surgery.  Routine precautions for OSA during anesthesia and postoperatively

## 2022-12-13 ENCOUNTER — Encounter: Payer: Self-pay | Admitting: Family Medicine

## 2022-12-13 ENCOUNTER — Ambulatory Visit (INDEPENDENT_AMBULATORY_CARE_PROVIDER_SITE_OTHER): Payer: Medicare PPO | Admitting: Family Medicine

## 2022-12-13 VITALS — BP 120/71 | HR 79 | Temp 98.6°F | Ht 68.0 in | Wt 258.0 lb

## 2022-12-13 DIAGNOSIS — I83893 Varicose veins of bilateral lower extremities with other complications: Secondary | ICD-10-CM

## 2022-12-13 DIAGNOSIS — I890 Lymphedema, not elsewhere classified: Secondary | ICD-10-CM | POA: Diagnosis not present

## 2022-12-13 NOTE — Patient Instructions (Signed)
Lymphedema  Lymphedema is swelling that happens when an abnormal amount of lymph collects in the soft tissues under your skin. Lymph is fluid that moves through your lymphatic system. This system: Is part of your body's defense system, also called your immune system. Filters germs and waste from tissues in your body to your bloodstream. Lymphedema happens when your lymphatic system is blocked. This keeps lymph from draining as it should and leads to swelling. What are the causes? The cause of lymphedema depends on which type you have. Primary lymphedema is when you're born without lymph vessels or with lymph vessels that aren't normal. Secondary lymphedema is more common. It happens when lymph vessels are blocked or damaged from: Infection. Injury. Radiation therapy. Cancer. Scar tissue that forms. Surgery. What are the signs or symptoms? A swollen arm, leg, feet, toes, or fingers. A heavy or tight feeling in the swollen area. Skin that turns red near the swollen area. Not being able to move your arm or leg. Your arm or leg is sensitive to touch. Discomfort in your arm or leg. How is this diagnosed? Lymphedema may be diagnosed based on: Your symptoms and medical history. A physical exam. Bioimpedance spectroscopy. This test uses painless electrical currents. They help measure fluid levels in your body. Imaging tests, such as: MRI or CT scan. Duplex ultrasound. This test uses sound waves to make pictures on a screen. The pictures show your blood vessels and blood flow. Lymphoscintigraphy. In this test, a low dose of radioactive substance is given through a needle that goes through your skin. The substance traces the flow of lymph through your lymph vessels. Lymphangiography. In this test, a contrast dye is put into your lymph vessel. The dye helps show if the vessel is blocked. How is this treated?  If another condition is causing your lymphedema, that condition will be treated.  For example, antibiotics may be used to treat infection. Treatment for lymphedema depends on the cause. Treatment may include: Complete decongestive therapy (CDT). This lowers fluid buildup. CDT includes: Pressure (compression) wrapping of the area. Manual lymph drainage. This helps lymph drain out of your arm or leg. Certain exercises. These help fluid move out of your arm or leg. Compression. This puts pressure on your arm or leg to lower swelling. It includes: Compression stockings or sleeves. Special bandage wraps. Surgery. This is normally done only for severe cases that don't get better with other treatments. Follow these instructions at home: Self-care Your swollen area is more likely to get hurt or infected. To help prevent infection: Keep the area clean and dry. Use creams or lotions that your health care provider says are okay. These keep your skin moist. Protect your skin from cuts. Use gloves when you cook or garden. Do not walk barefoot. If you shave the area, use an Neurosurgeon. Do not wear tight clothes, shoes, or jewelry. Eat a healthy diet. Eat a lot of fruits and vegetables. Activity Do exercises as told by your provider. Do not sit with your legs crossed. When you can, keep the swollen leg or foot raised above the level of your heart. Avoid using an arm with lymphedema to carry things. General instructions Wear compression stockings or sleeves as told by your provider. Note any changes in size of the swollen arm or leg. You may be told to measure it at set times and track the results. Take over-the-counter and prescription medicines only as told by your provider. If you were prescribed antibiotics, use them  as told by your provider. Do not stop using the antibiotic even if you start to feel better or if your condition improves. Do not use heating pads or ice packs on the swollen area. Avoid having your swollen arm or leg used for: Blood draws. IVs. Blood  pressure checks. Contact a health care provider if: You get new swelling in your arm or leg all of a sudden. Fluid leaks from the skin of your swollen arm or leg. You have a cut that doesn't heal. The swollen area hurts or turns red. You get purple spots, a rash, blisters, or sores on your swollen arm or leg. You have a fever or chills. This information is not intended to replace advice given to you by your health care provider. Make sure you discuss any questions you have with your health care provider. Document Revised: 07/21/2022 Document Reviewed: 07/21/2022 Elsevier Patient Education  2024 [employer].

## 2022-12-13 NOTE — Progress Notes (Signed)
Subjective: CC: Lymphedema PCP: Raliegh Ip, DO Kim Wilkerson is a 77 y.o. female presenting to clinic today for:  1.  Lymphedema Patient reports that she was seen by her pain specialist and they did not feel that the changes in her lower extremities had anything to do with osteoarthritis and or gout.  She was noted to have Baker's cyst posteriorly on bilateral lower extremities after having venous ultrasound with her pulmonologist, who discovered pulmonary emboli.  She is currently being treated with oral anticoagulation for this.  She reports no weeping of the skin.  She is wanting to get her legs looked at   ROS: Per HPI  Allergies  Allergen Reactions   Metformin And Related Diarrhea   Past Medical History:  Diagnosis Date   Arthritis    Functional heart murmur 2019   Hypertension 1980   Leg ulcer (HCC)    Morbid obesity (HCC)    Varicose veins     Current Outpatient Medications:    acetaminophen (TYLENOL) 650 MG CR tablet, Take 650 mg by mouth every 8 (eight) hours as needed for pain., Disp: , Rfl:    Alcohol Swabs (B-D SINGLE USE SWABS REGULAR) PADS, Check BGs daily Dx E11.9, Disp: 100 each, Rfl: 3   apixaban (ELIQUIS) 2.5 MG TABS tablet, Take 1 tablet (2.5 mg total) by mouth 2 (two) times daily., Disp: 60 tablet, Rfl: 2   Ascorbic Acid 53 MG LOZG, Take by mouth., Disp: , Rfl:    atorvastatin (LIPITOR) 40 MG tablet, TAKE 1 TABLET (40 MG TOTAL) BY MOUTH DAILY., Disp: 90 tablet, Rfl: 1   Blood Glucose Calibration (TRUE METRIX LEVEL 1) Low SOLN, Use w/ glucose meter Dx E11.9, Disp: 3 each, Rfl: 1   Blood Glucose Monitoring Suppl (TRUE METRIX AIR GLUCOSE METER) w/Device KIT, Check BGs daily Dx E11.9, Disp: 1 kit, Rfl: 0   cyclobenzaprine (FLEXERIL) 5 MG tablet, Take 1 tablet (5 mg total) by mouth 3 (three) times daily as needed for muscle spasms., Disp: 30 tablet, Rfl: 1   diclofenac Sodium (VOLTAREN) 1 % GEL, Apply 4 g topically 4 (four) times daily. As needed for  joint pain, Disp: 400 g, Rfl: 3   diltiazem (CARDIZEM CD) 240 MG 24 hr capsule, TAKE 1 CAPSULE EVERY DAY, Disp: 90 capsule, Rfl: 1   famotidine (PEPCID) 40 MG tablet, TAKE 1 TABLET TWICE DAILY, Disp: 180 tablet, Rfl: 1   furosemide (LASIX) 40 MG tablet, TAKE 1 TABLET EVERY DAY, Disp: 90 tablet, Rfl: 1   glucose blood (TRUE METRIX BLOOD GLUCOSE TEST) test strip, Check BGs daily Dx E11.9, Disp: 100 each, Rfl: 3   ibuprofen (ADVIL) 800 MG tablet, Take 0.5-1 tablets (400-800 mg total) by mouth 2 (two) times daily as needed for moderate pain., Disp: 180 tablet, Rfl: 1   Lancet Device MISC, UAD to check BGs. E11.9. May substitute to any manufacturer covered by patient's insurance., Disp: 1 each, Rfl: 0   Lancets Misc. MISC, Check BGs daily. E11.9. May substitute to any manufacturer covered by patient's insurance., Disp: 100 each, Rfl: PRN   lansoprazole (PREVACID) 30 MG capsule, TAKE 1 CAPSULE EVERY DAY AT 12 NOON., Disp: 90 capsule, Rfl: 1   losartan (COZAAR) 50 MG tablet, TAKE 1 TABLET EVERY DAY, Disp: 90 tablet, Rfl: 1   Semaglutide,0.25 or 0.5MG /DOS, (OZEMPIC, 0.25 OR 0.5 MG/DOSE,) 2 MG/3ML SOPN, Inject 0.5 mg into the skin every 7 (seven) days., Disp: 9 mL, Rfl: 3   traMADol (ULTRAM) 50 MG  tablet, Take 1 tablet (50 mg total) by mouth 3 (three) times daily as needed., Disp: 30 tablet, Rfl: 0   TRUEplus Lancets 33G MISC, Check BGs daily Dx E11.9, Disp: 100 each, Rfl: 3 Social History   Socioeconomic History   Marital status: Widowed    Spouse name: Not on file   Number of children: 3   Years of education: 12   Highest education level: High school graduate  Occupational History   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  Tobacco Use   Smoking status: Former    Current packs/day: 0.00    Types: Cigarettes    Quit date: 05/11/1975    Years since quitting: 47.6   Smokeless tobacco: Never  Vaping Use   Vaping status: Never Used  Substance and Sexual Activity   Alcohol use: Yes     Alcohol/week: 0.0 standard drinks of alcohol    Comment: 1 glass of wine every 2-3 weeks   Drug use: No   Sexual activity: Not Currently    Birth control/protection: Surgical  Other Topics Concern   Not on file  Social History Narrative   Not on file   Social Determinants of Health   Financial Resource Strain: Low Risk  (10/13/2022)   Overall Financial Resource Strain (CARDIA)    Difficulty of Paying Living Expenses: Not hard at all  Food Insecurity: No Food Insecurity (10/13/2022)   Hunger Vital Sign    Worried About Running Out of Food in the Last Year: Never true    Ran Out of Food in the Last Year: Never true  Transportation Needs: No Transportation Needs (10/13/2022)   PRAPARE - Administrator, Civil Service (Medical): No    Lack of Transportation (Non-Medical): No  Physical Activity: Inactive (10/13/2022)   Exercise Vital Sign    Days of Exercise per Week: 0 days    Minutes of Exercise per Session: 0 min  Stress: No Stress Concern Present (10/13/2022)   [employer] of Occupational Health - Occupational Stress Questionnaire    Feeling of Stress : Not at all  Social Connections: Moderately Isolated (10/13/2022)   Social Connection and Isolation Panel [NHANES]    Frequency of Communication with Friends and Family: More than three times a week    Frequency of Social Gatherings with Friends and Family: More than three times a week    Attends Religious Services: More than 4 times per year    Active Member of [employer] or Organizations: No    Attends Banker Meetings: Never    Marital Status: Widowed  Intimate Partner Violence: Not At Risk (10/13/2022)   Humiliation, Afraid, Rape, and Kick questionnaire    Fear of Current or Ex-Partner: No    Emotionally Abused: No    Physically Abused: No    Sexually Abused: No   Family History  Problem Relation Age of Onset   Hypertension Mother    Stroke Mother        after 18   Hyperlipidemia Mother    Thyroid disease  Mother    Hypertension Father    Cirrhosis Father    Alcohol abuse Father    Heart attack Sister 47   Hypertension Brother    Ovarian cancer Neg Hx    Breast cancer Neg Hx    Prostate cancer Neg Hx    Colon cancer Neg Hx     Objective: Office vital signs reviewed. BP 120/71   Pulse 79   Temp 98.6  F (37 C)   Ht 5\' 8"  (1.727 m)   Wt 258 lb (117 kg)   SpO2 96%   BMI 39.23 kg/m   Physical Examination:  General: Awake, alert, well nourished, No acute distress Extremities: Lymphedema and adiposity of bilateral lower extremities present.  No skin breakdown.  She has several varicose veins.  1 large one across the right ventral foot and right lower lateral leg.  She has another on the left medial ankle  Assessment/ Plan: 77 y.o. 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  Discussed consideration for lymphedema clinic at some point if not coordinated by vascular surgery.  She will follow-up as needed management of diabetes  No orders of the defined types were placed in this encounter.  No orders of the defined types were placed in this encounter.    Raliegh Ip, DO [facility] [phone]

## 2022-12-28 ENCOUNTER — Ambulatory Visit: Payer: Medicare PPO | Admitting: Family Medicine

## 2022-12-28 ENCOUNTER — Encounter: Payer: Self-pay | Admitting: Family Medicine

## 2022-12-28 VITALS — BP 140/75 | HR 61 | Temp 98.6°F | Ht 68.0 in | Wt 259.0 lb

## 2022-12-28 DIAGNOSIS — Z638 Other specified problems related to primary support group: Secondary | ICD-10-CM | POA: Diagnosis not present

## 2022-12-28 DIAGNOSIS — Z7985 Long-term (current) use of injectable non-insulin antidiabetic drugs: Secondary | ICD-10-CM

## 2022-12-28 DIAGNOSIS — E118 Type 2 diabetes mellitus with unspecified complications: Secondary | ICD-10-CM

## 2022-12-28 DIAGNOSIS — F419 Anxiety disorder, unspecified: Secondary | ICD-10-CM

## 2022-12-28 DIAGNOSIS — F5101 Primary insomnia: Secondary | ICD-10-CM | POA: Diagnosis not present

## 2022-12-28 LAB — BAYER DCA HB A1C WAIVED: HB A1C (BAYER DCA - WAIVED): 5.6 % (ref 4.8–5.6)

## 2022-12-28 MED ORDER — MIRTAZAPINE 7.5 MG PO TABS
7.5000 mg | ORAL_TABLET | Freq: Every day | ORAL | 0 refills | Status: DC
Start: 2022-12-28 — End: 2023-02-14

## 2022-12-28 NOTE — Progress Notes (Signed)
Subjective: CC: problems sleeping PCP: Raliegh Ip, DO UEA:VWUJ D Buxbaum is a 77 y.o. female presenting to clinic today for:  1. Insomnia Patient reports that she has been having increased anxiety such that sometimes she feels tremulous.  This occurs a lot at nighttime where she is pacing.  On average she is getting 4 to 6 hours of sleep per night.  She admits that she has been having some increased anxiety surrounding her son, who suffers from addiction.  He resides with her.  He supposed to be getting some hernia repairs soon and she is hopeful that his attitude will change after he is no longer in pain.  He apparently is on parole and residing with her.  2.  Type 2 diabetes Patient is tolerating the Ozempic without difficulty.  Continues to take her Cardizem, Cozaar and atorvastatin as directed   ROS: Per HPI  Allergies  Allergen Reactions   Metformin And Related Diarrhea   Past Medical History:  Diagnosis Date   Arthritis    Functional heart murmur 2019   Hypertension 1980   Leg ulcer (HCC)    Morbid obesity (HCC)    Varicose veins     Current Outpatient Medications:    acetaminophen (TYLENOL) 650 MG CR tablet, Take 650 mg by mouth every 8 (eight) hours as needed for pain., Disp: , Rfl:    Alcohol Swabs (B-D SINGLE USE SWABS REGULAR) PADS, Check BGs daily Dx E11.9, Disp: 100 each, Rfl: 3   apixaban (ELIQUIS) 2.5 MG TABS tablet, Take 1 tablet (2.5 mg total) by mouth 2 (two) times daily., Disp: 60 tablet, Rfl: 2   Ascorbic Acid 53 MG LOZG, Take by mouth., Disp: , Rfl:    atorvastatin (LIPITOR) 40 MG tablet, TAKE 1 TABLET (40 MG TOTAL) BY MOUTH DAILY., Disp: 90 tablet, Rfl: 1   Blood Glucose Calibration (TRUE METRIX LEVEL 1) Low SOLN, Use w/ glucose meter Dx E11.9, Disp: 3 each, Rfl: 1   Blood Glucose Monitoring Suppl (TRUE METRIX AIR GLUCOSE METER) w/Device KIT, Check BGs daily Dx E11.9, Disp: 1 kit, Rfl: 0   cyclobenzaprine (FLEXERIL) 5 MG tablet, Take 1 tablet (5  mg total) by mouth 3 (three) times daily as needed for muscle spasms., Disp: 30 tablet, Rfl: 1   diclofenac Sodium (VOLTAREN) 1 % GEL, Apply 4 g topically 4 (four) times daily. As needed for joint pain, Disp: 400 g, Rfl: 3   diltiazem (CARDIZEM CD) 240 MG 24 hr capsule, TAKE 1 CAPSULE EVERY DAY, Disp: 90 capsule, Rfl: 1   famotidine (PEPCID) 40 MG tablet, TAKE 1 TABLET TWICE DAILY, Disp: 180 tablet, Rfl: 1   furosemide (LASIX) 40 MG tablet, TAKE 1 TABLET EVERY DAY, Disp: 90 tablet, Rfl: 1   glucose blood (TRUE METRIX BLOOD GLUCOSE TEST) test strip, Check BGs daily Dx E11.9, Disp: 100 each, Rfl: 3   ibuprofen (ADVIL) 800 MG tablet, Take 0.5-1 tablets (400-800 mg total) by mouth 2 (two) times daily as needed for moderate pain., Disp: 180 tablet, Rfl: 1   Lancet Device MISC, UAD to check BGs. E11.9. May substitute to any manufacturer covered by patient's insurance., Disp: 1 each, Rfl: 0   Lancets Misc. MISC, Check BGs daily. E11.9. May substitute to any manufacturer covered by patient's insurance., Disp: 100 each, Rfl: PRN   lansoprazole (PREVACID) 30 MG capsule, TAKE 1 CAPSULE EVERY DAY AT 12 NOON., Disp: 90 capsule, Rfl: 1   losartan (COZAAR) 50 MG tablet, TAKE 1 TABLET EVERY DAY,  Disp: 90 tablet, Rfl: 1   Semaglutide,0.25 or 0.5MG /DOS, (OZEMPIC, 0.25 OR 0.5 MG/DOSE,) 2 MG/3ML SOPN, Inject 0.5 mg into the skin every 7 (seven) days., Disp: 9 mL, Rfl: 3   traMADol (ULTRAM) 50 MG tablet, Take 1 tablet (50 mg total) by mouth 3 (three) times daily as needed., Disp: 30 tablet, Rfl: 0   TRUEplus Lancets 33G MISC, Check BGs daily Dx E11.9, Disp: 100 each, Rfl: 3 Social History   Socioeconomic History   Marital status: Widowed    Spouse name: Not on file   Number of children: 3   Years of education: 12   Highest education level: High school graduate  Occupational History   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  Tobacco Use   Smoking status: Former    Current packs/day: 0.00    Types:  Cigarettes    Quit date: 05/11/1975    Years since quitting: 47.6   Smokeless tobacco: Never  Vaping Use   Vaping status: Never Used  Substance and Sexual Activity   Alcohol use: Yes    Alcohol/week: 0.0 standard drinks of alcohol    Comment: 1 glass of wine every 2-3 weeks   Drug use: No   Sexual activity: Not Currently    Birth control/protection: Surgical  Other Topics Concern   Not on file  Social History Narrative   Not on file   Social Determinants of Health   Financial Resource Strain: Low Risk  (10/13/2022)   Overall Financial Resource Strain (CARDIA)    Difficulty of Paying Living Expenses: Not hard at all  Food Insecurity: No Food Insecurity (10/13/2022)   Hunger Vital Sign    Worried About Running Out of Food in the Last Year: Never true    Ran Out of Food in the Last Year: Never true  Transportation Needs: No Transportation Needs (10/13/2022)   PRAPARE - Administrator, Civil Service (Medical): No    Lack of Transportation (Non-Medical): No  Physical Activity: Inactive (10/13/2022)   Exercise Vital Sign    Days of Exercise per Week: 0 days    Minutes of Exercise per Session: 0 min  Stress: No Stress Concern Present (10/13/2022)   [employer] of Occupational Health - Occupational Stress Questionnaire    Feeling of Stress : Not at all  Social Connections: Moderately Isolated (10/13/2022)   Social Connection and Isolation Panel [NHANES]    Frequency of Communication with Friends and Family: More than three times a week    Frequency of Social Gatherings with Friends and Family: More than three times a week    Attends Religious Services: More than 4 times per year    Active Member of [employer] or Organizations: No    Attends Banker Meetings: Never    Marital Status: Widowed  Intimate Partner Violence: Not At Risk (10/13/2022)   Humiliation, Afraid, Rape, and Kick questionnaire    Fear of Current or Ex-Partner: No    Emotionally Abused: No     Physically Abused: No    Sexually Abused: No   Family History  Problem Relation Age of Onset   Hypertension Mother    Stroke Mother        after 25   Hyperlipidemia Mother    Thyroid disease Mother    Hypertension Father    Cirrhosis Father    Alcohol abuse Father    Heart attack Sister 45   Hypertension Brother    Ovarian cancer Neg Hx  Breast cancer Neg Hx    Prostate cancer Neg Hx    Colon cancer Neg Hx     Objective: Office vital signs reviewed. BP (!) 140/75   Pulse 61   Temp 98.6 F (37 C)   Ht 5\' 8"  (1.727 m)   Wt 259 lb (117.5 kg)   SpO2 96%   BMI 39.38 kg/m   Physical Examination:  General: Awake, alert, well appearing female, No acute distress Psych: Appears anxious when talking about the struggle with her son.  Eye contact is fair     12/28/2022    8:52 AM 12/13/2022   11:06 AM 10/13/2022    8:23 AM 08/04/2022   10:13 AM 08/04/2022   10:02 AM  Depression screen PHQ 2/9  Decreased Interest 2 0 0 0 0  Down, Depressed, Hopeless 2 0 0 0 0  PHQ - 2 Score 4 0 0 0 0  Altered sleeping 3 0  2 0  Tired, decreased energy 3 0  3 0  Change in appetite 2 0  2 0  Feeling bad or failure about yourself  0 0  0 0  Trouble concentrating 1 0  0 0  Moving slowly or fidgety/restless 0 0  0 0  Suicidal thoughts 0 0  0 0  PHQ-9 Score 13 0  7 0  Difficult doing work/chores Somewhat difficult Not difficult at all  Not difficult at all       12/28/2022    8:52 AM 12/13/2022   11:05 AM 08/04/2022   10:13 AM 08/04/2022   10:02 AM  GAD 7 : Generalized Anxiety Score  Nervous, Anxious, on Edge 1 2 0 0  Control/stop worrying 1 2 2  0  Worry too much - different things 1 2 1  0  Trouble relaxing 1 0 1 0  Restless 1 2 2  0  Easily annoyed or irritable 1 1 1  0  Afraid - awful might happen 0 0 0 0  Total GAD 7 Score 6 9 7  0  Anxiety Difficulty Somewhat difficult Somewhat difficult Somewhat difficult Not difficult at all    Assessment/ Plan: 77 y.o. female   Primary insomnia -  Plan: mirtazapine (REMERON) 7.5 MG tablet  Stress due to family tension - Plan: mirtazapine (REMERON) 7.5 MG tablet  Anxiety - Plan: mirtazapine (REMERON) 7.5 MG tablet  Controlled type 2 diabetes mellitus with complication, without long-term current use of insulin (HCC) - Plan: Bayer DCA Hb A1c Waived, Microalbumin / creatinine urine ratio  I suspect that the sleep issue is in fact due to anxiety in the setting of the situation with her son.  I have offered referral to counseling services.  Will be glad to start mirtazapine but discussed that this may not in fact fix the underlying issue which I encouraged her to address with her son.  She declined counseling services but is amenable to mirtazapine.  I have encouraged her to contact me in the next 2 weeks to inform asked whether or not this dose is efficacious or we need to advance.  Did not discuss her diabetes into much detail this visit as much of her focus was spent on her mental health.  has been tolerating the GLP without difficulty.  Check urine microalbumin, A1c  Raliegh Ip, DO [facility] [phone]

## 2022-12-29 LAB — MICROALBUMIN / CREATININE URINE RATIO
Creatinine, Urine: 167.9 mg/dL
Microalb/Creat Ratio: 3 mg/g{creat} (ref 0–29)
Microalbumin, Urine: 4.7 ug/mL

## 2023-01-14 ENCOUNTER — Encounter: Payer: Self-pay | Admitting: Family Medicine

## 2023-01-14 ENCOUNTER — Ambulatory Visit (INDEPENDENT_AMBULATORY_CARE_PROVIDER_SITE_OTHER): Payer: Medicare PPO | Admitting: Family Medicine

## 2023-01-14 VITALS — BP 128/72 | HR 63 | Temp 98.5°F | Ht 68.0 in | Wt 260.0 lb

## 2023-01-14 DIAGNOSIS — M62838 Other muscle spasm: Secondary | ICD-10-CM | POA: Diagnosis not present

## 2023-01-14 DIAGNOSIS — M5030 Other cervical disc degeneration, unspecified cervical region: Secondary | ICD-10-CM

## 2023-01-14 NOTE — Progress Notes (Signed)
Subjective: CC: Not on neck PCP: Raliegh Ip, DO VZD:GLOV D Bicker is a 77 y.o. female presenting to clinic today for:  1. Knot on neck Patient reports about 1 month history of worsening right-sided neck pain.  She has pain when she rotates her head to the right.  She felt a knot at the back of her neck.  She has been taking hydrocodone as prescribed by her specialist twice daily.  She has muscle relaxer at home.  She utilized some topical heat and muscle rubs.  Has a history of degenerative changes in the C-spine based on MRI of C-spine in 2014.  She was told that she could have surgery in this area for disc issues but that they could not ensure that this would resolve her pain.  She subsequently did not pursue treatment.   ROS: Per HPI  Allergies  Allergen Reactions   Metformin And Related Diarrhea   Past Medical History:  Diagnosis Date   Arthritis    Functional heart murmur 2019   Hypertension 1980   Leg ulcer (HCC)    Morbid obesity (HCC)    Varicose veins     Current Outpatient Medications:    acetaminophen (TYLENOL) 650 MG CR tablet, Take 650 mg by mouth every 8 (eight) hours as needed for pain., Disp: , Rfl:    Alcohol Swabs (B-D SINGLE USE SWABS REGULAR) PADS, Check BGs daily Dx E11.9, Disp: 100 each, Rfl: 3   apixaban (ELIQUIS) 2.5 MG TABS tablet, Take 1 tablet (2.5 mg total) by mouth 2 (two) times daily., Disp: 60 tablet, Rfl: 2   Ascorbic Acid 53 MG LOZG, Take by mouth., Disp: , Rfl:    atorvastatin (LIPITOR) 40 MG tablet, TAKE 1 TABLET (40 MG TOTAL) BY MOUTH DAILY., Disp: 90 tablet, Rfl: 1   Blood Glucose Calibration (TRUE METRIX LEVEL 1) Low SOLN, Use w/ glucose meter Dx E11.9, Disp: 3 each, Rfl: 1   Blood Glucose Monitoring Suppl (TRUE METRIX AIR GLUCOSE METER) w/Device KIT, Check BGs daily Dx E11.9, Disp: 1 kit, Rfl: 0   cyclobenzaprine (FLEXERIL) 5 MG tablet, Take 1 tablet (5 mg total) by mouth 3 (three) times daily as needed for muscle spasms., Disp: 30  tablet, Rfl: 1   diclofenac Sodium (VOLTAREN) 1 % GEL, Apply 4 g topically 4 (four) times daily. As needed for joint pain, Disp: 400 g, Rfl: 3   diltiazem (CARDIZEM CD) 240 MG 24 hr capsule, TAKE 1 CAPSULE EVERY DAY, Disp: 90 capsule, Rfl: 1   famotidine (PEPCID) 40 MG tablet, TAKE 1 TABLET TWICE DAILY, Disp: 180 tablet, Rfl: 1   furosemide (LASIX) 40 MG tablet, TAKE 1 TABLET EVERY DAY, Disp: 90 tablet, Rfl: 1   glucose blood (TRUE METRIX BLOOD GLUCOSE TEST) test strip, Check BGs daily Dx E11.9, Disp: 100 each, Rfl: 3   ibuprofen (ADVIL) 800 MG tablet, Take 0.5-1 tablets (400-800 mg total) by mouth 2 (two) times daily as needed for moderate pain., Disp: 180 tablet, Rfl: 1   Lancet Device MISC, UAD to check BGs. E11.9. May substitute to any manufacturer covered by patient's insurance., Disp: 1 each, Rfl: 0   Lancets Misc. MISC, Check BGs daily. E11.9. May substitute to any manufacturer covered by patient's insurance., Disp: 100 each, Rfl: PRN   lansoprazole (PREVACID) 30 MG capsule, TAKE 1 CAPSULE EVERY DAY AT 12 NOON., Disp: 90 capsule, Rfl: 1   losartan (COZAAR) 50 MG tablet, TAKE 1 TABLET EVERY DAY, Disp: 90 tablet, Rfl: 1  mirtazapine (REMERON) 7.5 MG tablet, Take 1 tablet (7.5 mg total) by mouth at bedtime., Disp: 30 tablet, Rfl: 0   Semaglutide,0.25 or 0.5MG /DOS, (OZEMPIC, 0.25 OR 0.5 MG/DOSE,) 2 MG/3ML SOPN, Inject 0.5 mg into the skin every 7 (seven) days., Disp: 9 mL, Rfl: 3   traMADol (ULTRAM) 50 MG tablet, Take 1 tablet (50 mg total) by mouth 3 (three) times daily as needed., Disp: 30 tablet, Rfl: 0   TRUEplus Lancets 33G MISC, Check BGs daily Dx E11.9, Disp: 100 each, Rfl: 3 Social History   Socioeconomic History   Marital status: Widowed    Spouse name: Not on file   Number of children: 3   Years of education: 12   Highest education level: High school graduate  Occupational History   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  Tobacco Use   Smoking status: Former     Current packs/day: 0.00    Types: Cigarettes    Quit date: 05/11/1975    Years since quitting: 47.7   Smokeless tobacco: Never  Vaping Use   Vaping status: Never Used  Substance and Sexual Activity   Alcohol use: Yes    Alcohol/week: 0.0 standard drinks of alcohol    Comment: 1 glass of wine every 2-3 weeks   Drug use: No   Sexual activity: Not Currently    Birth control/protection: Surgical  Other Topics Concern   Not on file  Social History Narrative   Not on file   Social Determinants of Health   Financial Resource Strain: Low Risk  (10/13/2022)   Overall Financial Resource Strain (CARDIA)    Difficulty of Paying Living Expenses: Not hard at all  Food Insecurity: No Food Insecurity (10/13/2022)   Hunger Vital Sign    Worried About Running Out of Food in the Last Year: Never true    Ran Out of Food in the Last Year: Never true  Transportation Needs: No Transportation Needs (10/13/2022)   PRAPARE - Administrator, Civil Service (Medical): No    Lack of Transportation (Non-Medical): No  Physical Activity: Inactive (10/13/2022)   Exercise Vital Sign    Days of Exercise per Week: 0 days    Minutes of Exercise per Session: 0 min  Stress: No Stress Concern Present (10/13/2022)   [employer] of Occupational Health - Occupational Stress Questionnaire    Feeling of Stress : Not at all  Social Connections: Moderately Isolated (10/13/2022)   Social Connection and Isolation Panel [NHANES]    Frequency of Communication with Friends and Family: More than three times a week    Frequency of Social Gatherings with Friends and Family: More than three times a week    Attends Religious Services: More than 4 times per year    Active Member of [employer] or Organizations: No    Attends Banker Meetings: Never    Marital Status: Widowed  Intimate Partner Violence: Not At Risk (10/13/2022)   Humiliation, Afraid, Rape, and Kick questionnaire    Fear of Current or Ex-Partner: No     Emotionally Abused: No    Physically Abused: No    Sexually Abused: No   Family History  Problem Relation Age of Onset   Hypertension Mother    Stroke Mother        after 35   Hyperlipidemia Mother    Thyroid disease Mother    Hypertension Father    Cirrhosis Father    Alcohol abuse Father    Heart  attack Sister 96   Hypertension Brother    Ovarian cancer Neg Hx    Breast cancer Neg Hx    Prostate cancer Neg Hx    Colon cancer Neg Hx     Objective: Office vital signs reviewed. BP 128/72   Pulse 63   Temp 98.5 F (36.9 C)   Ht 5\' 8"  (1.727 m)   Wt 260 lb (117.9 kg)   SpO2 96%   BMI 39.53 kg/m   Physical Examination:  General: Awake, alert, No acute distress MSK: Increased tonicity of the paraspinal muscles and trapezius muscle on the right.  She has no midline tenderness palpation to the C-spine.  She has visible discomfort with rotation to the right.  Also has some difficulty with flexion of the C-spine.  Assessment/ Plan: 77 y.o. female   Trapezius muscle spasm  DDD (degenerative disc disease), cervical  Physical exam consistent with trapezius muscle spasm which I suspect is secondary to degenerative disc disease of the C-spine.  I did offer referral to spinal to consider corticosteroid injection should she desire but she would like to defer this.  She will continue pain medication, muscle relaxant, heat and topicals.  Encouraged her to follow-up as needed with me on this issue   Raliegh Ip, DO [facility] [phone]

## 2023-01-14 NOTE — Patient Instructions (Signed)
Cervical Strain and Sprain Rehab Ask your health care provider which exercises are safe for you. Do exercises exactly as told by your health care provider and adjust them as directed. It is normal to feel mild stretching, pulling, tightness, or discomfort as you do these exercises. Stop right away if you feel sudden pain or your pain gets worse. Do not begin these exercises until told by your health care provider. Stretching and range-of-motion exercises Cervical side bending  Using good posture, sit on a stable chair or stand up. Without moving your shoulders, slowly tilt your left / right ear to your shoulder until you feel a stretch in the neck muscles on the opposite side. You should be looking straight ahead. Hold for __________ seconds. Repeat with the other side of your neck. Repeat __________ times. Complete this exercise __________ times a day. Cervical rotation  Using good posture, sit on a stable chair or stand up. Slowly turn your head to the side as if you are looking over your left / right shoulder. Keep your eyes level with the ground. Stop when you feel a stretch along the side and the back of your neck. Hold for __________ seconds. Repeat this by turning to your other side. Repeat __________ times. Complete this exercise __________ times a day. Thoracic extension and pectoral stretch  Roll a towel or a small blanket so it is about 4 inches (10 cm) in diameter. Lie down on your back on a firm surface. Put the towel in the middle of your back across your spine. It should not be under your shoulder blades. Put your hands behind your head and let your elbows fall out to your sides. Hold for __________ seconds. Repeat __________ times. Complete this exercise __________ times a day. Strengthening exercises Upper cervical flexion  Lie on your back with a thin pillow behind your head or a small, rolled-up towel under your neck. Gently tuck your chin toward your chest and nod  your head down to look toward your feet. Do not lift your head off the pillow. Hold for __________ seconds. Release the tension slowly. Relax your neck muscles completely before you repeat this exercise. Repeat __________ times. Complete this exercise __________ times a day. Cervical extension  Stand about 6 inches (15 cm) away from a wall, with your back facing the wall. Place a soft object, about 6-8 inches (15-20 cm) in diameter, between the back of your head and the wall. A soft object could be a small pillow, a ball, or a folded towel. Gently tilt your head back and press into the soft object. Keep your jaw and forehead relaxed. Hold for __________ seconds. Release the tension slowly. Relax your neck muscles completely before you repeat this exercise. Repeat __________ times. Complete this exercise __________ times a day. Posture and body mechanics Body mechanics refer to the movements and positions of your body while you do your daily activities. Posture is part of body mechanics. Good posture and healthy body mechanics can help to relieve stress in your body's tissues and joints. Good posture means that your spine is in its natural S-curve position (your spine is neutral), your shoulders are pulled back slightly, and your head is not tipped forward. The following are general guidelines for using improved posture and body mechanics in your everyday activities. Sitting  When sitting, keep your spine neutral and keep your feet flat on the floor. Use a footrest, if needed, and keep your thighs parallel to the floor. Avoid rounding  your shoulders. Avoid tilting your head forward. When working at a desk or a computer, keep your desk at a height where your hands are slightly lower than your elbows. Slide your chair under your desk so you are close enough to maintain good posture. When working at a computer, place your monitor at a height where you are looking straight ahead and you do not have to  tilt your head forward or downward to look at the screen. Standing  When standing, keep your spine neutral and keep your feet about hip-width apart. Keep a slight bend in your knees. Your ears, shoulders, and hips should line up. When you do a task in which you stand in one place for a long time, place one foot up on a stable object that is 2-4 inches (5-10 cm) high, such as a footstool. This helps keep your spine neutral. Resting When lying down and resting, avoid positions that are most painful for you. Try to support your neck in a neutral position. You can use a contour pillow or a small rolled-up towel. Your pillow should support your neck but not push on it. This information is not intended to replace advice given to you by your health care provider. Make sure you discuss any questions you have with your health care provider. Document Revised: 08/30/2022 Document Reviewed: 11/16/2021 Elsevier Patient Education  2024 [employer].

## 2023-02-02 ENCOUNTER — Telehealth: Payer: Self-pay | Admitting: Family Medicine

## 2023-02-02 ENCOUNTER — Other Ambulatory Visit: Payer: Self-pay | Admitting: Family Medicine

## 2023-02-02 DIAGNOSIS — I10 Essential (primary) hypertension: Secondary | ICD-10-CM

## 2023-02-02 DIAGNOSIS — E782 Mixed hyperlipidemia: Secondary | ICD-10-CM

## 2023-02-02 DIAGNOSIS — K219 Gastro-esophageal reflux disease without esophagitis: Secondary | ICD-10-CM

## 2023-02-02 DIAGNOSIS — G8929 Other chronic pain: Secondary | ICD-10-CM

## 2023-02-03 ENCOUNTER — Other Ambulatory Visit (HOSPITAL_BASED_OUTPATIENT_CLINIC_OR_DEPARTMENT_OTHER): Payer: Self-pay | Admitting: Pulmonary Disease

## 2023-02-07 ENCOUNTER — Other Ambulatory Visit: Payer: Self-pay | Admitting: *Deleted

## 2023-02-07 DIAGNOSIS — M7989 Other specified soft tissue disorders: Secondary | ICD-10-CM

## 2023-02-07 DIAGNOSIS — I8390 Asymptomatic varicose veins of unspecified lower extremity: Secondary | ICD-10-CM

## 2023-02-09 ENCOUNTER — Ambulatory Visit: Payer: Medicare PPO | Admitting: Family Medicine

## 2023-02-10 ENCOUNTER — Telehealth: Payer: Self-pay | Admitting: Pulmonary Disease

## 2023-02-10 NOTE — Telephone Encounter (Signed)
Received surgical clearance request from Dr Alfonso Patten. Patient is undergoing left total knee arthroplasty on 03/22/23 and needs clearance prior to then. RA is booked until 12/13 and Apps are booked until 11/20. LOV 12/09/22.

## 2023-02-11 NOTE — Telephone Encounter (Signed)
Dr Vassie Loll- please advise if you can add risk assessment to LOV 12/09/22. Also will need to know about holding eliquis. Thanks!

## 2023-02-14 ENCOUNTER — Ambulatory Visit (INDEPENDENT_AMBULATORY_CARE_PROVIDER_SITE_OTHER): Payer: Medicare PPO | Admitting: Family Medicine

## 2023-02-14 ENCOUNTER — Encounter: Payer: Self-pay | Admitting: Family Medicine

## 2023-02-14 VITALS — BP 140/70 | HR 72 | Temp 98.4°F | Ht 68.0 in | Wt 269.0 lb

## 2023-02-14 DIAGNOSIS — Z638 Other specified problems related to primary support group: Secondary | ICD-10-CM | POA: Diagnosis not present

## 2023-02-14 DIAGNOSIS — F419 Anxiety disorder, unspecified: Secondary | ICD-10-CM | POA: Diagnosis not present

## 2023-02-14 DIAGNOSIS — F5101 Primary insomnia: Secondary | ICD-10-CM | POA: Diagnosis not present

## 2023-02-14 DIAGNOSIS — I152 Hypertension secondary to endocrine disorders: Secondary | ICD-10-CM

## 2023-02-14 DIAGNOSIS — E118 Type 2 diabetes mellitus with unspecified complications: Secondary | ICD-10-CM

## 2023-02-14 DIAGNOSIS — E1169 Type 2 diabetes mellitus with other specified complication: Secondary | ICD-10-CM

## 2023-02-14 DIAGNOSIS — Z23 Encounter for immunization: Secondary | ICD-10-CM | POA: Diagnosis not present

## 2023-02-14 DIAGNOSIS — E1159 Type 2 diabetes mellitus with other circulatory complications: Secondary | ICD-10-CM

## 2023-02-14 DIAGNOSIS — M1712 Unilateral primary osteoarthritis, left knee: Secondary | ICD-10-CM

## 2023-02-14 DIAGNOSIS — E785 Hyperlipidemia, unspecified: Secondary | ICD-10-CM

## 2023-02-14 MED ORDER — ATORVASTATIN CALCIUM 40 MG PO TABS
40.0000 mg | ORAL_TABLET | Freq: Every day | ORAL | 3 refills | Status: AC
Start: 2023-02-14 — End: ?

## 2023-02-14 MED ORDER — LANCETS MISC. MISC
3 refills | Status: AC
Start: 2023-02-14 — End: ?

## 2023-02-14 MED ORDER — BLOOD GLUCOSE TEST VI STRP
ORAL_STRIP | 3 refills | Status: DC
Start: 2023-02-14 — End: 2023-06-30

## 2023-02-14 MED ORDER — LOSARTAN POTASSIUM 50 MG PO TABS: ORAL_TABLET | ORAL | 3 refills | Status: DC

## 2023-02-14 MED ORDER — LANCET DEVICE MISC
0 refills | Status: AC
Start: 2023-02-14 — End: ?

## 2023-02-14 MED ORDER — DILTIAZEM HCL ER COATED BEADS 240 MG PO CP24
240.0000 mg | ORAL_CAPSULE | Freq: Every day | ORAL | 3 refills | Status: AC
Start: 2023-02-14 — End: ?

## 2023-02-14 MED ORDER — MIRTAZAPINE 7.5 MG PO TABS
7.5000 mg | ORAL_TABLET | Freq: Every day | ORAL | 3 refills | Status: AC
Start: 2023-02-14 — End: ?

## 2023-02-14 MED ORDER — BLOOD GLUCOSE MONITORING SUPPL DEVI
0 refills | Status: AC
Start: 2023-02-14 — End: ?

## 2023-02-14 NOTE — Progress Notes (Signed)
Subjective: CC: Insomnia PCP: Raliegh Ip, DO NWG:NFAO D Brotherton is a 77 y.o. female presenting to clinic today for:  1.  Insomnia, anxiety Patient reports that she has been feeling much better on the mirtazapine 7.5 mg nightly.  She reports no complaints would like to have that refilled  2.  Left-sided knee pain Patient with advanced osteoarthritis of the left knee.  She will be undergoing surgical intervention in the next month with a provider up in [place], Dr. Seth Bake with [facility].  She has not yet contacted Dr. Vassie Loll for pulmonary clearance.  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  They were going to discuss discontinuation of Eliquis next month anyways.  2.  Type 2 diabetes with hypertension hyperlipidemia She is compliant with medications.  Needs new meter.  Has not been able to afford Ozempic and so has been out for the last week.  She reports no hypoglycemic episodes.   ROS: Per HPI  Allergies  Allergen Reactions   Metformin And Related Diarrhea   Past Medical History:  Diagnosis Date   Arthritis    Functional heart murmur 2019   Hypertension 1980   Leg ulcer (HCC)    Morbid obesity (HCC)    Varicose veins     Current Outpatient Medications:    acetaminophen (TYLENOL) 650 MG CR tablet, Take 650 mg by mouth every 8 (eight) hours as needed for pain., Disp: , Rfl:    Alcohol Swabs (B-D SINGLE USE SWABS REGULAR) PADS, Check BGs daily Dx E11.9, Disp: 100 each, Rfl: 3   Ascorbic Acid 53 MG LOZG, Take by mouth., Disp: , Rfl:    Blood Glucose Monitoring Suppl DEVI, Check BGs daily as directed. May substitute to any manufacturer covered by patient's insurance. E11.9, Disp: 1 each, Rfl: 0   cyclobenzaprine (FLEXERIL) 5 MG tablet, Take 1 tablet (5 mg total) by mouth 3 (three) times daily as needed for muscle spasms., Disp: 30 tablet, Rfl: 1   diclofenac Sodium (VOLTAREN) 1 % GEL, Apply 4 g topically 4 (four)  times daily. As needed for joint pain, Disp: 400 g, Rfl: 3   ELIQUIS 2.5 MG TABS tablet, TAKE 1 TABLET(2.5 MG) BY MOUTH TWICE DAILY, Disp: 60 tablet, Rfl: 2   famotidine (PEPCID) 40 MG tablet, TAKE 1 TABLET TWICE DAILY, Disp: 180 tablet, Rfl: 1   furosemide (LASIX) 40 MG tablet, TAKE 1 TABLET EVERY DAY, Disp: 90 tablet, Rfl: 0   Glucose Blood (BLOOD GLUCOSE TEST STRIPS) STRP, Check BGs daily as directed. E11.9 May substitute to any manufacturer covered by patient's insurance., Disp: 100 strip, Rfl: 3   IBU 800 MG tablet, TAKE 1/2 TO 1 TABLET TWICE DAILY AS NEEDED FOR MODERATE PAIN, Disp: 180 tablet, Rfl: 0   Lancet Device MISC, Check BGs daily as directed.E11.9. May substitute to any manufacturer covered by patient's insurance., Disp: 1 each, Rfl: 0   Lancets Misc. MISC, Check BGs once daily as directed. E11.9 May substitute to any manufacturer covered by patient's insurance., Disp: 100 each, Rfl: 3   lansoprazole (PREVACID) 30 MG capsule, TAKE 1 CAPSULE EVERY DAY AT 12 NOON., Disp: 90 capsule, Rfl: 0   Semaglutide,0.25 or 0.5MG /DOS, (OZEMPIC, 0.25 OR 0.5 MG/DOSE,) 2 MG/3ML SOPN, Inject 0.5 mg into the skin every 7 (seven) days., Disp: 9 mL, Rfl: 3   traMADol (ULTRAM) 50 MG tablet, Take 1 tablet (50 mg total) by mouth 3 (three) times daily as needed., Disp: 30 tablet,  Rfl: 0   atorvastatin (LIPITOR) 40 MG tablet, Take 1 tablet (40 mg total) by mouth daily., Disp: 90 tablet, Rfl: 3   diltiazem (CARDIZEM CD) 240 MG 24 hr capsule, Take 1 capsule (240 mg total) by mouth daily., Disp: 90 capsule, Rfl: 3   losartan (COZAAR) 50 MG tablet, TAKE 1 TABLET EVERY DAY, Disp: 90 tablet, Rfl: 3   mirtazapine (REMERON) 7.5 MG tablet, Take 1 tablet (7.5 mg total) by mouth at bedtime., Disp: 90 tablet, Rfl: 3 Social History   Socioeconomic History   Marital status: Widowed    Spouse name: Not on file   Number of children: 3   Years of education: 12   Highest education level: High school graduate  Occupational  History   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  Tobacco Use   Smoking status: Former    Current packs/day: 0.00    Types: Cigarettes    Quit date: 05/11/1975    Years since quitting: 47.7   Smokeless tobacco: Never  Vaping Use   Vaping status: Never Used  Substance and Sexual Activity   Alcohol use: Yes    Alcohol/week: 0.0 standard drinks of alcohol    Comment: 1 glass of wine every 2-3 weeks   Drug use: No   Sexual activity: Not Currently    Birth control/protection: Surgical  Other Topics Concern   Not on file  Social History Narrative   Not on file   Social Determinants of Health   Financial Resource Strain: Low Risk  (10/13/2022)   Overall Financial Resource Strain (CARDIA)    Difficulty of Paying Living Expenses: Not hard at all  Food Insecurity: No Food Insecurity (10/13/2022)   Hunger Vital Sign    Worried About Running Out of Food in the Last Year: Never true    Ran Out of Food in the Last Year: Never true  Transportation Needs: No Transportation Needs (10/13/2022)   PRAPARE - Administrator, Civil Service (Medical): No    Lack of Transportation (Non-Medical): No  Physical Activity: Inactive (10/13/2022)   Exercise Vital Sign    Days of Exercise per Week: 0 days    Minutes of Exercise per Session: 0 min  Stress: No Stress Concern Present (10/13/2022)   [employer] of Occupational Health - Occupational Stress Questionnaire    Feeling of Stress : Not at all  Social Connections: Moderately Isolated (10/13/2022)   Social Connection and Isolation Panel [NHANES]    Frequency of Communication with Friends and Family: More than three times a week    Frequency of Social Gatherings with Friends and Family: More than three times a week    Attends Religious Services: More than 4 times per year    Active Member of [employer] or Organizations: No    Attends Banker Meetings: Never    Marital Status: Widowed  Intimate Partner Violence: Not At Risk  (10/13/2022)   Humiliation, Afraid, Rape, and Kick questionnaire    Fear of Current or Ex-Partner: No    Emotionally Abused: No    Physically Abused: No    Sexually Abused: No   Family History  Problem Relation Age of Onset   Hypertension Mother    Stroke Mother        after 6   Hyperlipidemia Mother    Thyroid disease Mother    Hypertension Father    Cirrhosis Father    Alcohol abuse Father    Heart attack Sister 21  Hypertension Brother    Ovarian cancer Neg Hx    Breast cancer Neg Hx    Prostate cancer Neg Hx    Colon cancer Neg Hx     Objective: Office vital signs reviewed. BP (!) 140/70   Pulse 72   Temp 98.4 F (36.9 C)   Ht 5\' 8"  (1.727 m)   Wt 269 lb (122 kg)   SpO2 97%   BMI 40.90 kg/m   Physical Examination:  General: Awake, alert, morbidly obese, No acute distress HEENT: Sclera white.  Moist mucous membranes Cardio: regular rate and rhythm, S1S2 heard, no murmurs appreciated Pulm: clear to auscultation bilaterally, no wheezes, rhonchi or rales; normal work of breathing on room air MSK: Antalgic gait and station.  Utilizing walker for ambulation.  Arthritic changes noted to bilateral knees  Assessment/ Plan: 77 y.o. female   Primary insomnia - Plan: mirtazapine (REMERON) 7.5 MG tablet  Stress due to family tension - Plan: mirtazapine (REMERON) 7.5 MG tablet  Anxiety - Plan: mirtazapine (REMERON) 7.5 MG tablet  Encounter for immunization - Plan: Flu Vaccine Trivalent High Dose (Fluad)  Controlled type 2 diabetes mellitus with complication, without long-term current use of insulin (HCC) - Plan: Blood Glucose Monitoring Suppl DEVI, Glucose Blood (BLOOD GLUCOSE TEST STRIPS) STRP, Lancet Device MISC, Lancets Misc. MISC, AMB Referral to Pharmacy Medication Management  Hypertension associated with diabetes (HCC) - Plan: losartan (COZAAR) 50 MG tablet, diltiazem (CARDIZEM CD) 240 MG 24 hr capsule  Hyperlipidemia associated with type 2 diabetes mellitus  (HCC) - Plan: atorvastatin (LIPITOR) 40 MG tablet  Primary osteoarthritis of left knee  Insomnia and anxiety responding well to mirtazapine.  This has been renewed and sent to mail order  Influenza vaccination administered  Her sugar has been controlled.  I have renewed a meter, testing supplies etc. and sent to mail order.  She was given 2 boxes of Ozempic and I encouraged her to follow-up with Raynelle Fanning for patient assistance program  Her blood pressure is controlled I have renewed her medication  She will continue statin for prevention of cardiovascular events in the setting of diabetes  I anticipate no barrier to proceeding with surgical intervention for her left knee.  Will see if we can get a patient clearance form from her orthopedist office.  She has preop clearance scheduled at an outside facility in [place] at the end of the month.  Discussed holding Ozempic for 1 week prior to surgery as well as Eliquis for at least 5 days.  Should be able to resume immediately following surgery barring no bleeding complications  Raliegh Ip, DO [facility] [phone]

## 2023-02-15 ENCOUNTER — Telehealth: Payer: Self-pay

## 2023-02-15 NOTE — Telephone Encounter (Signed)
Kim Milch, MD  to Lbpu Triage [facility]     02/14/23  9:52 PM Preop clearance was already in the note from her last visit  Note from 12/09/22 faxed to Dr. Alfonso Patten- [phone].

## 2023-02-15 NOTE — Progress Notes (Signed)
   Care Guide Note  02/15/2023 Name: [name] MRN: [id number] DOB: 07-16-1945  Referred by: Raliegh Ip, DO Reason for referral : Care Coordination (Outreach to schedule with Pharm d )   Kim Wilkerson is a 77 y.o. year old female who is a primary care patient of Raliegh Ip, DO. Kim Wilkerson was referred to the pharmacist for assistance related to DM.    An unsuccessful telephone outreach was attempted today to contact the patient who was referred to the pharmacy team for assistance with medication assistance. Additional attempts will be made to contact the patient.   Kim Wilkerson, RMA Care Guide [facility]  [place], [place] [address] Direct Dial: [phone] [email or web address]

## 2023-02-16 ENCOUNTER — Ambulatory Visit (HOSPITAL_COMMUNITY): Payer: Medicare PPO

## 2023-02-18 NOTE — Progress Notes (Signed)
   Care Guide Note  02/18/2023 Name: Kim Wilkerson MRN: [id number] DOB: [birth year]  Referred by: Raliegh Ip, DO Reason for referral : Care Coordination (Outreach to schedule with Pharm d )   [name] is a 77 y.o. year old female who is a primary care patient of Raliegh Ip, DO. ALEXCIS BICKING was referred to the pharmacist for assistance related to DM.    A second unsuccessful telephone outreach was attempted today to contact the patient who was referred to the pharmacy team for assistance with medication management. Additional attempts will be made to contact the patient.  Penne Lash, RMA Care Guide [facility]  [place], [place] [address] Direct Dial: [phone] [email or web address]

## 2023-02-22 NOTE — Progress Notes (Signed)
   Care Guide Note  02/22/2023 Name: Kim Wilkerson MRN: [id number] DOB: 03-19-1946  Referred by: Raliegh Ip, DO Reason for referral : Care Coordination (Outreach to schedule with Pharm d )   Kim Wilkerson is a 77 y.o. year old female who is a primary care patient of Raliegh Ip, DO. Kim Wilkerson was referred to the pharmacist for assistance related to DM.    A third unsuccessful telephone outreach was attempted today to contact the patient who was referred to the pharmacy team for assistance with medication assistance. The Population Health team is pleased to engage with this patient at any time in the future upon receipt of referral and should he/she be interested in assistance from the [facility] team.   Penne Lash, RMA Care Guide [facility]  [place], [place] [address] Direct Dial: [phone] [email or web address]

## 2023-02-24 ENCOUNTER — Telehealth: Payer: Self-pay | Admitting: Pulmonary Disease

## 2023-02-24 NOTE — Telephone Encounter (Signed)
Pt needs a earlier appt than 05/15/2022 to get her surgical clearance

## 2023-03-02 NOTE — Telephone Encounter (Signed)
Patient is calling back. She needs an appointment that is earlier than January in order for her to get surgical clearance for her knee replacement.Last Appointment was on August the 1st with Dr.Alva.  Please advise.

## 2023-03-03 NOTE — Telephone Encounter (Signed)
Please refer to 02/10/2023 phone note.  Patient is aware that appt is not needed. Risk assessment was added to last OV note.  Nothing further needed.

## 2023-03-15 ENCOUNTER — Telehealth: Payer: Self-pay

## 2023-03-15 NOTE — Telephone Encounter (Signed)
Pt will need to be seen for surgical clearance.

## 2023-03-15 NOTE — Telephone Encounter (Signed)
Just document that she needs to be seen and they can call and get her scheduled for surgical clearance

## 2023-03-15 NOTE — Telephone Encounter (Addendum)
Misty Stanley with [facility] called and states that they have patient scheduled for total knee replacement for Nov 12th and they need  recent office notes stating that the patient is cleared for surgery. Patient was seen on 02/14/2023 for regular follow up for chronic care - she is asking if addendum can be added to the note from that office visit.  Please advise if this can be done or if patient needs to come in for an office visit.   Dr. Nadine Counts patient - sending to covering provider.  Fax number for [facility] [phone]

## 2023-03-16 ENCOUNTER — Telehealth: Payer: Self-pay | Admitting: Pulmonary Disease

## 2023-03-16 NOTE — Telephone Encounter (Signed)
Lmtcb to schedule an apt for clearance

## 2023-03-16 NOTE — Telephone Encounter (Signed)
Surgical clearance papers faxed to Dr.Velyvis [phone]. Nothing else further needed

## 2023-03-16 NOTE — Telephone Encounter (Signed)
Kim Wilkerson needs surgical clearance note. Patient having pre op tomorrow morning. Kim Wilkerson phone number is [phone]. Fax number is [phone].

## 2023-03-18 NOTE — Telephone Encounter (Signed)
Called and LMTCB  -  discussed with Kim Wilkerson since were unable to get in touch with the patient to schedule appt.  Reviewed chart and Dr. Nadine Counts spoke about upcoming surgery and Pulmonary has done note for surgery.  Per Kim Wilkerson this should be what ortho needs - faxed office notes and advised on faxed to contact our office if anything else is needed .

## 2023-03-31 ENCOUNTER — Telehealth: Payer: Self-pay | Admitting: Family Medicine

## 2023-03-31 NOTE — Telephone Encounter (Signed)
Copied from CRM [id number]. Topic: Clinical - Medication Question >> Mar 30, 2023  3:35 PM Prudencio Pair wrote: Reason for CRM: Patient called stating that she just got out of the hospital. Dr. Nadine Counts has her on Ozempic. She was told to stop taking it when she was in the hospital. Stated she called the doctor that she was seeing in the hospital and they told her to ask her PCP when does she need to start back taking it. Pt is requesting for either Dr. Nadine Counts or nurse to give her a call back to let her know when does she resume with taking her Ozempic. CB #: [id number].

## 2023-04-01 NOTE — Telephone Encounter (Signed)
Did she have the surgery? Typically this is held for 1-2 weeks before surgery and then resumed after surgery is completed.

## 2023-04-04 NOTE — Telephone Encounter (Signed)
Pt had knee replacement , advised her she could start taking it since she is back home

## 2023-04-16 ENCOUNTER — Other Ambulatory Visit: Payer: Self-pay | Admitting: Family Medicine

## 2023-04-16 DIAGNOSIS — G8929 Other chronic pain: Secondary | ICD-10-CM

## 2023-04-16 DIAGNOSIS — I10 Essential (primary) hypertension: Secondary | ICD-10-CM

## 2023-04-16 DIAGNOSIS — K219 Gastro-esophageal reflux disease without esophagitis: Secondary | ICD-10-CM

## 2023-04-19 DIAGNOSIS — E6609 Other obesity due to excess calories: Secondary | ICD-10-CM | POA: Diagnosis not present

## 2023-04-19 DIAGNOSIS — M47816 Spondylosis without myelopathy or radiculopathy, lumbar region: Secondary | ICD-10-CM | POA: Diagnosis not present

## 2023-04-19 DIAGNOSIS — Z79899 Other long term (current) drug therapy: Secondary | ICD-10-CM | POA: Diagnosis not present

## 2023-04-19 DIAGNOSIS — I10 Essential (primary) hypertension: Secondary | ICD-10-CM | POA: Diagnosis not present

## 2023-04-19 DIAGNOSIS — M25561 Pain in right knee: Secondary | ICD-10-CM | POA: Diagnosis not present

## 2023-04-19 DIAGNOSIS — M25562 Pain in left knee: Secondary | ICD-10-CM | POA: Diagnosis not present

## 2023-04-19 DIAGNOSIS — M5134 Other intervertebral disc degeneration, thoracic region: Secondary | ICD-10-CM | POA: Diagnosis not present

## 2023-04-19 DIAGNOSIS — M170 Bilateral primary osteoarthritis of knee: Secondary | ICD-10-CM | POA: Diagnosis not present

## 2023-04-19 DIAGNOSIS — M47812 Spondylosis without myelopathy or radiculopathy, cervical region: Secondary | ICD-10-CM | POA: Diagnosis not present

## 2023-05-09 DIAGNOSIS — M170 Bilateral primary osteoarthritis of knee: Secondary | ICD-10-CM | POA: Diagnosis not present

## 2023-05-09 DIAGNOSIS — Z96652 Presence of left artificial knee joint: Secondary | ICD-10-CM | POA: Diagnosis not present

## 2023-05-10 DIAGNOSIS — Z96652 Presence of left artificial knee joint: Secondary | ICD-10-CM | POA: Diagnosis not present

## 2023-05-10 DIAGNOSIS — M25562 Pain in left knee: Secondary | ICD-10-CM | POA: Diagnosis not present

## 2023-05-10 DIAGNOSIS — R2689 Other abnormalities of gait and mobility: Secondary | ICD-10-CM | POA: Diagnosis not present

## 2023-05-10 DIAGNOSIS — R262 Difficulty in walking, not elsewhere classified: Secondary | ICD-10-CM | POA: Diagnosis not present

## 2023-05-10 DIAGNOSIS — M6281 Muscle weakness (generalized): Secondary | ICD-10-CM | POA: Diagnosis not present

## 2023-05-12 DIAGNOSIS — M25562 Pain in left knee: Secondary | ICD-10-CM | POA: Diagnosis not present

## 2023-05-12 DIAGNOSIS — M6281 Muscle weakness (generalized): Secondary | ICD-10-CM | POA: Diagnosis not present

## 2023-05-12 DIAGNOSIS — R2689 Other abnormalities of gait and mobility: Secondary | ICD-10-CM | POA: Diagnosis not present

## 2023-05-12 DIAGNOSIS — R262 Difficulty in walking, not elsewhere classified: Secondary | ICD-10-CM | POA: Diagnosis not present

## 2023-05-12 DIAGNOSIS — Z96652 Presence of left artificial knee joint: Secondary | ICD-10-CM | POA: Diagnosis not present

## 2023-05-13 DIAGNOSIS — R262 Difficulty in walking, not elsewhere classified: Secondary | ICD-10-CM | POA: Diagnosis not present

## 2023-05-13 DIAGNOSIS — M6281 Muscle weakness (generalized): Secondary | ICD-10-CM | POA: Diagnosis not present

## 2023-05-13 DIAGNOSIS — Z96652 Presence of left artificial knee joint: Secondary | ICD-10-CM | POA: Diagnosis not present

## 2023-05-13 DIAGNOSIS — R2689 Other abnormalities of gait and mobility: Secondary | ICD-10-CM | POA: Diagnosis not present

## 2023-05-13 DIAGNOSIS — M25562 Pain in left knee: Secondary | ICD-10-CM | POA: Diagnosis not present

## 2023-05-17 DIAGNOSIS — R2689 Other abnormalities of gait and mobility: Secondary | ICD-10-CM | POA: Diagnosis not present

## 2023-05-17 DIAGNOSIS — R262 Difficulty in walking, not elsewhere classified: Secondary | ICD-10-CM | POA: Diagnosis not present

## 2023-05-17 DIAGNOSIS — Z96652 Presence of left artificial knee joint: Secondary | ICD-10-CM | POA: Diagnosis not present

## 2023-05-17 DIAGNOSIS — M25562 Pain in left knee: Secondary | ICD-10-CM | POA: Diagnosis not present

## 2023-05-17 DIAGNOSIS — M6281 Muscle weakness (generalized): Secondary | ICD-10-CM | POA: Diagnosis not present

## 2023-05-18 DIAGNOSIS — R2689 Other abnormalities of gait and mobility: Secondary | ICD-10-CM | POA: Diagnosis not present

## 2023-05-18 DIAGNOSIS — M6281 Muscle weakness (generalized): Secondary | ICD-10-CM | POA: Diagnosis not present

## 2023-05-18 DIAGNOSIS — Z96652 Presence of left artificial knee joint: Secondary | ICD-10-CM | POA: Diagnosis not present

## 2023-05-18 DIAGNOSIS — R262 Difficulty in walking, not elsewhere classified: Secondary | ICD-10-CM | POA: Diagnosis not present

## 2023-05-18 DIAGNOSIS — M25562 Pain in left knee: Secondary | ICD-10-CM | POA: Diagnosis not present

## 2023-05-19 DIAGNOSIS — I10 Essential (primary) hypertension: Secondary | ICD-10-CM | POA: Diagnosis not present

## 2023-05-19 DIAGNOSIS — M5134 Other intervertebral disc degeneration, thoracic region: Secondary | ICD-10-CM | POA: Diagnosis not present

## 2023-05-19 DIAGNOSIS — M47816 Spondylosis without myelopathy or radiculopathy, lumbar region: Secondary | ICD-10-CM | POA: Diagnosis not present

## 2023-05-19 DIAGNOSIS — F1120 Opioid dependence, uncomplicated: Secondary | ICD-10-CM | POA: Diagnosis not present

## 2023-05-19 DIAGNOSIS — Z79899 Other long term (current) drug therapy: Secondary | ICD-10-CM | POA: Diagnosis not present

## 2023-05-19 DIAGNOSIS — M25561 Pain in right knee: Secondary | ICD-10-CM | POA: Diagnosis not present

## 2023-05-19 DIAGNOSIS — M25562 Pain in left knee: Secondary | ICD-10-CM | POA: Diagnosis not present

## 2023-05-19 DIAGNOSIS — G8929 Other chronic pain: Secondary | ICD-10-CM | POA: Diagnosis not present

## 2023-05-19 DIAGNOSIS — M47812 Spondylosis without myelopathy or radiculopathy, cervical region: Secondary | ICD-10-CM | POA: Diagnosis not present

## 2023-05-20 DIAGNOSIS — M25562 Pain in left knee: Secondary | ICD-10-CM | POA: Diagnosis not present

## 2023-05-20 DIAGNOSIS — R262 Difficulty in walking, not elsewhere classified: Secondary | ICD-10-CM | POA: Diagnosis not present

## 2023-05-20 DIAGNOSIS — Z96652 Presence of left artificial knee joint: Secondary | ICD-10-CM | POA: Diagnosis not present

## 2023-05-20 DIAGNOSIS — M6281 Muscle weakness (generalized): Secondary | ICD-10-CM | POA: Diagnosis not present

## 2023-05-20 DIAGNOSIS — R2689 Other abnormalities of gait and mobility: Secondary | ICD-10-CM | POA: Diagnosis not present

## 2023-05-23 DIAGNOSIS — M6281 Muscle weakness (generalized): Secondary | ICD-10-CM | POA: Diagnosis not present

## 2023-05-23 DIAGNOSIS — Z96652 Presence of left artificial knee joint: Secondary | ICD-10-CM | POA: Diagnosis not present

## 2023-05-23 DIAGNOSIS — R2689 Other abnormalities of gait and mobility: Secondary | ICD-10-CM | POA: Diagnosis not present

## 2023-05-23 DIAGNOSIS — R262 Difficulty in walking, not elsewhere classified: Secondary | ICD-10-CM | POA: Diagnosis not present

## 2023-05-23 DIAGNOSIS — M25562 Pain in left knee: Secondary | ICD-10-CM | POA: Diagnosis not present

## 2023-05-25 DIAGNOSIS — M6281 Muscle weakness (generalized): Secondary | ICD-10-CM | POA: Diagnosis not present

## 2023-05-25 DIAGNOSIS — Z96652 Presence of left artificial knee joint: Secondary | ICD-10-CM | POA: Diagnosis not present

## 2023-05-25 DIAGNOSIS — R2689 Other abnormalities of gait and mobility: Secondary | ICD-10-CM | POA: Diagnosis not present

## 2023-05-25 DIAGNOSIS — R262 Difficulty in walking, not elsewhere classified: Secondary | ICD-10-CM | POA: Diagnosis not present

## 2023-05-25 DIAGNOSIS — M25562 Pain in left knee: Secondary | ICD-10-CM | POA: Diagnosis not present

## 2023-05-27 DIAGNOSIS — R2689 Other abnormalities of gait and mobility: Secondary | ICD-10-CM | POA: Diagnosis not present

## 2023-05-27 DIAGNOSIS — M25562 Pain in left knee: Secondary | ICD-10-CM | POA: Diagnosis not present

## 2023-05-27 DIAGNOSIS — Z96652 Presence of left artificial knee joint: Secondary | ICD-10-CM | POA: Diagnosis not present

## 2023-05-27 DIAGNOSIS — M6281 Muscle weakness (generalized): Secondary | ICD-10-CM | POA: Diagnosis not present

## 2023-05-27 DIAGNOSIS — R262 Difficulty in walking, not elsewhere classified: Secondary | ICD-10-CM | POA: Diagnosis not present

## 2023-05-30 DIAGNOSIS — M6281 Muscle weakness (generalized): Secondary | ICD-10-CM | POA: Diagnosis not present

## 2023-05-30 DIAGNOSIS — M25562 Pain in left knee: Secondary | ICD-10-CM | POA: Diagnosis not present

## 2023-05-30 DIAGNOSIS — R2689 Other abnormalities of gait and mobility: Secondary | ICD-10-CM | POA: Diagnosis not present

## 2023-05-30 DIAGNOSIS — R262 Difficulty in walking, not elsewhere classified: Secondary | ICD-10-CM | POA: Diagnosis not present

## 2023-05-30 DIAGNOSIS — Z96652 Presence of left artificial knee joint: Secondary | ICD-10-CM | POA: Diagnosis not present

## 2023-06-01 DIAGNOSIS — Z96652 Presence of left artificial knee joint: Secondary | ICD-10-CM | POA: Diagnosis not present

## 2023-06-01 DIAGNOSIS — R262 Difficulty in walking, not elsewhere classified: Secondary | ICD-10-CM | POA: Diagnosis not present

## 2023-06-01 DIAGNOSIS — R2689 Other abnormalities of gait and mobility: Secondary | ICD-10-CM | POA: Diagnosis not present

## 2023-06-01 DIAGNOSIS — M25562 Pain in left knee: Secondary | ICD-10-CM | POA: Diagnosis not present

## 2023-06-01 DIAGNOSIS — M6281 Muscle weakness (generalized): Secondary | ICD-10-CM | POA: Diagnosis not present

## 2023-06-03 DIAGNOSIS — M25562 Pain in left knee: Secondary | ICD-10-CM | POA: Diagnosis not present

## 2023-06-03 DIAGNOSIS — Z96652 Presence of left artificial knee joint: Secondary | ICD-10-CM | POA: Diagnosis not present

## 2023-06-03 DIAGNOSIS — R2689 Other abnormalities of gait and mobility: Secondary | ICD-10-CM | POA: Diagnosis not present

## 2023-06-03 DIAGNOSIS — R262 Difficulty in walking, not elsewhere classified: Secondary | ICD-10-CM | POA: Diagnosis not present

## 2023-06-03 DIAGNOSIS — M6281 Muscle weakness (generalized): Secondary | ICD-10-CM | POA: Diagnosis not present

## 2023-06-06 DIAGNOSIS — M25562 Pain in left knee: Secondary | ICD-10-CM | POA: Diagnosis not present

## 2023-06-06 DIAGNOSIS — Z96652 Presence of left artificial knee joint: Secondary | ICD-10-CM | POA: Diagnosis not present

## 2023-06-06 DIAGNOSIS — R262 Difficulty in walking, not elsewhere classified: Secondary | ICD-10-CM | POA: Diagnosis not present

## 2023-06-06 DIAGNOSIS — M6281 Muscle weakness (generalized): Secondary | ICD-10-CM | POA: Diagnosis not present

## 2023-06-06 DIAGNOSIS — R2689 Other abnormalities of gait and mobility: Secondary | ICD-10-CM | POA: Diagnosis not present

## 2023-06-08 DIAGNOSIS — R2689 Other abnormalities of gait and mobility: Secondary | ICD-10-CM | POA: Diagnosis not present

## 2023-06-08 DIAGNOSIS — M6281 Muscle weakness (generalized): Secondary | ICD-10-CM | POA: Diagnosis not present

## 2023-06-08 DIAGNOSIS — Z96652 Presence of left artificial knee joint: Secondary | ICD-10-CM | POA: Diagnosis not present

## 2023-06-08 DIAGNOSIS — M25562 Pain in left knee: Secondary | ICD-10-CM | POA: Diagnosis not present

## 2023-06-08 DIAGNOSIS — R262 Difficulty in walking, not elsewhere classified: Secondary | ICD-10-CM | POA: Diagnosis not present

## 2023-06-10 DIAGNOSIS — Z96652 Presence of left artificial knee joint: Secondary | ICD-10-CM | POA: Diagnosis not present

## 2023-06-10 DIAGNOSIS — M25562 Pain in left knee: Secondary | ICD-10-CM | POA: Diagnosis not present

## 2023-06-10 DIAGNOSIS — M6281 Muscle weakness (generalized): Secondary | ICD-10-CM | POA: Diagnosis not present

## 2023-06-10 DIAGNOSIS — R262 Difficulty in walking, not elsewhere classified: Secondary | ICD-10-CM | POA: Diagnosis not present

## 2023-06-10 DIAGNOSIS — R2689 Other abnormalities of gait and mobility: Secondary | ICD-10-CM | POA: Diagnosis not present

## 2023-06-14 ENCOUNTER — Other Ambulatory Visit (HOSPITAL_BASED_OUTPATIENT_CLINIC_OR_DEPARTMENT_OTHER): Payer: Self-pay | Admitting: Pulmonary Disease

## 2023-06-30 ENCOUNTER — Other Ambulatory Visit: Payer: Self-pay | Admitting: Family Medicine

## 2023-06-30 DIAGNOSIS — I10 Essential (primary) hypertension: Secondary | ICD-10-CM

## 2023-06-30 DIAGNOSIS — E118 Type 2 diabetes mellitus with unspecified complications: Secondary | ICD-10-CM

## 2023-07-07 ENCOUNTER — Ambulatory Visit: Payer: Medicare PPO | Admitting: Family Medicine

## 2023-07-07 ENCOUNTER — Other Ambulatory Visit: Payer: Medicare PPO

## 2023-07-07 ENCOUNTER — Encounter: Payer: Self-pay | Admitting: Family Medicine

## 2023-07-07 VITALS — BP 136/75 | HR 62 | Temp 98.4°F | Ht 68.0 in | Wt 260.0 lb

## 2023-07-07 DIAGNOSIS — E785 Hyperlipidemia, unspecified: Secondary | ICD-10-CM

## 2023-07-07 DIAGNOSIS — E1159 Type 2 diabetes mellitus with other circulatory complications: Secondary | ICD-10-CM | POA: Diagnosis not present

## 2023-07-07 DIAGNOSIS — Z86711 Personal history of pulmonary embolism: Secondary | ICD-10-CM

## 2023-07-07 DIAGNOSIS — I152 Hypertension secondary to endocrine disorders: Secondary | ICD-10-CM | POA: Diagnosis not present

## 2023-07-07 DIAGNOSIS — Z7985 Long-term (current) use of injectable non-insulin antidiabetic drugs: Secondary | ICD-10-CM

## 2023-07-07 DIAGNOSIS — I499 Cardiac arrhythmia, unspecified: Secondary | ICD-10-CM

## 2023-07-07 DIAGNOSIS — E1169 Type 2 diabetes mellitus with other specified complication: Secondary | ICD-10-CM | POA: Diagnosis not present

## 2023-07-07 NOTE — Progress Notes (Signed)
 Subjective: ZO:XWRUEAV arrhythmia PCP: Raliegh Ip, DO WUJ:[id number] Kim Wilkerson is a 78 y.o. female presenting to clinic today for:  1. Cardiac arrhythmia Patient was seen by pain management and they noted that she had some type of cardiac arrhythmia.  She notes that they did get an EKG on her and told her to follow-up with her PCP  2.  Type 2 diabetes associate with hypertension, hyperlipidemia She reports compliance with medications.  Ozempic has been quite unaffordable at over $250 per month.  She has been compliant with medication but finds it to be very unaffordable.  She notes that she has not taken her losartan in the last several days and blood pressures have remained normal.  Often she forgets to take that 1 but she does remember her Cardizem and her Lasix for fluid.  Compliant with Lipitor.  3.  History of DVT/PE Patient has not taken her Eliquis in several weeks now due to cost.  When she bought it on May 20, 2023 it was $280.  This was for only a 2-week supply.  She subsequently discontinued the medication.  She reports no chest pain, shortness of breath, unilateral swelling   ROS: Per HPI  Allergies  Allergen Reactions   Metformin And Related Diarrhea   Past Medical History:  Diagnosis Date   Arthritis    Functional heart murmur 2019   Hypertension 1980   Leg ulcer (HCC)    Morbid obesity (HCC)    Varicose veins     Current Outpatient Medications:    acetaminophen (TYLENOL) 650 MG CR tablet, Take 650 mg by mouth every 8 (eight) hours as needed for pain., Disp: , Rfl:    Alcohol Swabs (DROPSAFE ALCOHOL PREP) 70 % PADS, USE TO CHECK BLOOD GLUCOSE DAILY, Disp: 100 each, Rfl: 0   apixaban (ELIQUIS) 2.5 MG TABS tablet, TAKE 1 TABLET(2.5 MG) BY MOUTH TWICE DAILY, Disp: 60 tablet, Rfl: 3   Ascorbic Acid 53 MG LOZG, Take by mouth., Disp: , Rfl:    atorvastatin (LIPITOR) 40 MG tablet, Take 1 tablet (40 mg total) by mouth daily., Disp: 90 tablet, Rfl: 3   Blood  Glucose Monitoring Suppl DEVI, Check BGs daily as directed. May substitute to any manufacturer covered by patient's insurance. E11.9, Disp: 1 each, Rfl: 0   cyclobenzaprine (FLEXERIL) 5 MG tablet, Take 1 tablet (5 mg total) by mouth 3 (three) times daily as needed for muscle spasms., Disp: 30 tablet, Rfl: 1   diclofenac Sodium (VOLTAREN) 1 % GEL, Apply 4 g topically 4 (four) times daily. As needed for joint pain, Disp: 400 g, Rfl: 3   diltiazem (CARDIZEM CD) 240 MG 24 hr capsule, Take 1 capsule (240 mg total) by mouth daily., Disp: 90 capsule, Rfl: 3   famotidine (PEPCID) 40 MG tablet, TAKE 1 TABLET TWICE DAILY, Disp: 180 tablet, Rfl: 1   furosemide (LASIX) 40 MG tablet, TAKE 1 TABLET EVERY DAY, Disp: 90 tablet, Rfl: 3   Glucose Blood (BLOOD GLUCOSE TEST STRIPS) STRP, TEST BLOOD SUGAR EVERY DAY, Disp: 100 strip, Rfl: 3   ibuprofen (IBU) 800 MG tablet, TAKE 1/2 TO 1 TABLET TWICE DAILY AS NEEDED FOR MODERATE PAIN, Disp: 180 tablet, Rfl: 1   Lancet Device MISC, Check BGs daily as directed.E11.9. May substitute to any manufacturer covered by patient's insurance., Disp: 1 each, Rfl: 0   Lancets Misc. MISC, Check BGs once daily as directed. E11.9 May substitute to any manufacturer covered by patient's insurance., Disp: 100 each, Rfl: 3  lansoprazole (PREVACID) 30 MG capsule, TAKE 1 CAPSULE EVERY DAY AT 12 NOON., Disp: 90 capsule, Rfl: 1   losartan (COZAAR) 50 MG tablet, TAKE 1 TABLET EVERY DAY, Disp: 90 tablet, Rfl: 3   mirtazapine (REMERON) 7.5 MG tablet, Take 1 tablet (7.5 mg total) by mouth at bedtime., Disp: 90 tablet, Rfl: 3   Semaglutide,0.25 or 0.5MG /DOS, (OZEMPIC, 0.25 OR 0.5 MG/DOSE,) 2 MG/3ML SOPN, Inject 0.5 mg into the skin every 7 (seven) days., Disp: 9 mL, Rfl: 3   traMADol (ULTRAM) 50 MG tablet, Take 1 tablet (50 mg total) by mouth 3 (three) times daily as needed., Disp: 30 tablet, Rfl: 0   TRUEplus Lancets 33G MISC, CHECK BLOOD GLUCOSE DAILY, Disp: 100 each, Rfl: 3 Social History    Socioeconomic History   Marital status: Widowed    Spouse name: Not on file   Number of children: 3   Years of education: 12   Highest education level: High school graduate  Occupational History   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  Tobacco Use   Smoking status: Former    Current packs/day: 0.00    Types: Cigarettes    Quit date: 05/11/1975    Years since quitting: 48.1   Smokeless tobacco: Never  Vaping Use   Vaping status: Never Used  Substance and Sexual Activity   Alcohol use: Yes    Alcohol/week: 0.0 standard drinks of alcohol    Comment: 1 glass of wine every 2-3 weeks   Drug use: No   Sexual activity: Not Currently    Birth control/protection: Surgical  Other Topics Concern   Not on file  Social History Narrative   Not on file   Social Drivers of Health   Financial Resource Strain: Low Risk  (10/13/2022)   Overall Financial Resource Strain (CARDIA)    Difficulty of Paying Living Expenses: Not hard at all  Food Insecurity: No Food Insecurity (10/13/2022)   Hunger Vital Sign    Worried About Running Out of Food in the Last Year: Never true    Ran Out of Food in the Last Year: Never true  Transportation Needs: No Transportation Needs (04/22/2023)   Received from [facility]   OASIS A1250: Transportation    Lack of Transportation (Medical): No    Lack of Transportation (Non-Medical): No    Patient Unable or Declines to Respond: No  Physical Activity: Inactive (10/13/2022)   Exercise Vital Sign    Days of Exercise per Week: 0 days    Minutes of Exercise per Session: 0 min  Stress: No Stress Concern Present (10/13/2022)   [employer] of Occupational Health - Occupational Stress Questionnaire    Feeling of Stress : Not at all  Social Connections: Moderately Isolated (10/13/2022)   Social Connection and Isolation Panel [NHANES]    Frequency of Communication with Friends and Family: More than three times a week    Frequency of Social Gatherings with  Friends and Family: More than three times a week    Attends Religious Services: More than 4 times per year    Active Member of [employer] or Organizations: No    Attends Banker Meetings: Never    Marital Status: Widowed  Intimate Partner Violence: Not At Risk (10/13/2022)   Humiliation, Afraid, Rape, and Kick questionnaire    Fear of Current or Ex-Partner: No    Emotionally Abused: No    Physically Abused: No    Sexually Abused: No   Family History  Problem  Relation Age of Onset   Hypertension Mother    Stroke Mother        after 50   Hyperlipidemia Mother    Thyroid disease Mother    Hypertension Father    Cirrhosis Father    Alcohol abuse Father    Heart attack Sister 32   Hypertension Brother    Ovarian cancer Neg Hx    Breast cancer Neg Hx    Prostate cancer Neg Hx    Colon cancer Neg Hx     Objective: Office vital signs reviewed. BP 136/75   Pulse 62   Temp 98.4 F (36.9 C)   Ht 5\' 8"  (1.727 m)   Wt 260 lb (117.9 kg)   SpO2 96%   BMI 39.53 kg/m   Physical Examination:  General: Awake, alert, morbidly obese, No acute distress HEENT: Sclera white.  Moist mucous membranes Cardio: regular rate with irregular extra beat heard, S1S2 heard, no murmurs appreciated Pulm: clear to auscultation bilaterally, no wheezes, rhonchi or rales; normal work of breathing on room air MSK: Antalgic gait  Assessment/ Plan: 78 y.o. female   Cardiac arrhythmia, unspecified cardiac arrhythmia type - Plan: EKG 12-Lead, CMP14+EGFR, Magnesium, TSH + free T4, CBC, LONG TERM MONITOR (3-14 DAYS)  Diabetes mellitus treated with injections of non-insulin medication (HCC) - Plan: Hemoglobin A1c, AMB Referral VBCI Care Management, CANCELED: Bayer DCA Hb A1c Waived  Hypertension associated with diabetes (HCC) - Plan: AMB Referral [facility]BCI Care Management  Hyperlipidemia associated with type 2 diabetes mellitus (HCC) - Plan: AMB Referral VBCI Care Management  History of pulmonary  embolism - Plan: AMB Referral [facility]BCI Care Management  While I did not capture cardiac arrhythmia on EKG I was able to auscultate intermittent extra beats.  These were not regular some going to place her on a Holter monitor to investigate for possible paroxysmal atrial fibrillation or excessive burden of PVCs.  She is anticoagulated but has been out of her medication due to cost.  Raynelle Fanning did meet with her today and Eliquis 5mg  samples were given along with a application for patient assistance program.  I did advise her to cut the Eliquis 5 mg in half and take twice daily as she is prescribed 2.5 mg  In the meantime check magnesium, thyroid levels, CBC and CMP to look for any metabolic derangements that may be causing arrhythmia  A1c collected to ensure that diabetes is well-controlled.  She is also working with her on getting Ozempic as this is been costly   Her blood pressure was controlled and she will continue medications as prescribed.  Okay to DC Cozaar as she has not taken this in several days and she was normotensive today   Kim Tory Hulen Skains, DO [facility] [phone]

## 2023-07-08 ENCOUNTER — Telehealth: Payer: Self-pay

## 2023-07-08 ENCOUNTER — Encounter: Payer: Self-pay | Admitting: Family Medicine

## 2023-07-08 ENCOUNTER — Other Ambulatory Visit: Payer: Self-pay | Admitting: Family Medicine

## 2023-07-08 DIAGNOSIS — D649 Anemia, unspecified: Secondary | ICD-10-CM

## 2023-07-08 LAB — CBC
Hematocrit: 34.8 % (ref 34.0–46.6)
Hemoglobin: 10.7 g/dL — ABNORMAL LOW (ref 11.1–15.9)
MCH: 24.5 pg — ABNORMAL LOW (ref 26.6–33.0)
MCHC: 30.7 g/dL — ABNORMAL LOW (ref 31.5–35.7)
MCV: 80 fL (ref 79–97)
Platelets: 270 10*3/uL (ref 150–450)
RBC: 4.37 x10E6/uL (ref 3.77–5.28)
RDW: 16 % — ABNORMAL HIGH (ref 11.7–15.4)
WBC: 5.6 10*3/uL (ref 3.4–10.8)

## 2023-07-08 LAB — CMP14+EGFR
ALT: 7 IU/L (ref 0–32)
AST: 11 IU/L (ref 0–40)
Albumin: 3.9 g/dL (ref 3.8–4.8)
Alkaline Phosphatase: 177 IU/L — ABNORMAL HIGH (ref 44–121)
BUN/Creatinine Ratio: 11 — ABNORMAL LOW (ref 12–28)
BUN: 10 mg/dL (ref 8–27)
Bilirubin Total: 0.7 mg/dL (ref 0.0–1.2)
CO2: 25 mmol/L (ref 20–29)
Calcium: 8.7 mg/dL (ref 8.7–10.3)
Chloride: 102 mmol/L (ref 96–106)
Creatinine, Ser: 0.89 mg/dL (ref 0.57–1.00)
Globulin, Total: 2.4 g/dL (ref 1.5–4.5)
Glucose: 105 mg/dL — ABNORMAL HIGH (ref 70–99)
Potassium: 4 mmol/L (ref 3.5–5.2)
Sodium: 142 mmol/L (ref 134–144)
Total Protein: 6.3 g/dL (ref 6.0–8.5)
eGFR: 68 mL/min/{1.73_m2} (ref >59)

## 2023-07-08 LAB — HEMOGLOBIN A1C
Est. average glucose Bld gHb Est-mCnc: 111 mg/dL
Hgb A1c MFr Bld: 5.5 % (ref 4.8–5.6)

## 2023-07-08 LAB — TSH+FREE T4
Free T4: 1.17 ng/dL (ref 0.82–1.77)
TSH: 1.7 u[IU]/mL (ref 0.450–4.500)

## 2023-07-08 LAB — MAGNESIUM: Magnesium: 1.9 mg/dL (ref 1.6–2.3)

## 2023-07-08 NOTE — Progress Notes (Signed)
 Pharmacy Medication Assistance Program Note    07/20/2023  Patient ID: Kim Wilkerson, female  DOB: Feb 15, 1946, 78 y.o.  MRN:  [id number]     07/08/2023  Outreach Medication Two  Manufacturer Medication Two [employer]  [facility] (Med Two) Eliquis  Dose of Eliquis 5MG   Type of Assistance Manufacturer Assistance  Date Application Received From Patient 07/20/2023  Date Application Received From Provider 07/20/2023     Completed application with Durward Mallard. Patient provided oop expenses from pharmacy, however 2024 and 2025 expenses were on sheet. [facility] will only accept 2025. Will call patient.

## 2023-07-08 NOTE — Progress Notes (Signed)
 Pharmacy Medication Assistance Program Note    07/20/2023  Patient ID: Kim Wilkerson, female   DOB: 04/15/1946, 78 y.o.   MRN: [id number]     07/08/2023  Outreach Medication One  Manufacturer Medication One [employer] [facility] Ozempic  Dose of Ozempic 0.5mg   Type of Assistance Manufacturer Assistance  Date Application Received From Patient 07/20/2023  Date Application Received From Provider 07/08/2023  Date Application Submitted to Manufacturer 07/20/2023  Method Application Sent to Manufacturer Fax     New - faxed.

## 2023-07-12 ENCOUNTER — Telehealth: Payer: Self-pay

## 2023-07-12 NOTE — Progress Notes (Signed)
 Care Guide Pharmacy Note  07/12/2023 Name: Kim Wilkerson MRN: [id number] DOB: 10-24-1945  Referred By: Raliegh Ip, DO Reason for referral: Care Coordination (Outreach to schedule with Pharm d )   Kim Wilkerson is a 78 y.o. year old female who is a primary care patient of Raliegh Ip, DO.  Kim Wilkerson was referred to the pharmacist for assistance related to: DMII  An unsuccessful telephone outreach was attempted today to contact the patient who was referred to the pharmacy team for assistance with medication assistance. Additional attempts will be made to contact the patient.  Penne Lash , RMA     [facility]  [facility], [facility] Guide  Direct Dial: [phone]  Website: [email or web address]

## 2023-07-20 NOTE — Progress Notes (Signed)
 Care Guide Pharmacy Note  07/20/2023 Name: Kim Wilkerson MRN: [id number] DOB: 02-18-1946  Referred By: Raliegh Ip, DO Reason for referral: Care Coordination (Outreach to schedule with Pharm d )   Kim Wilkerson is a 78 y.o. year old female who is a primary care patient of Raliegh Ip, DO.  Kim Wilkerson was referred to the pharmacist for assistance related to: DMII  Successful contact was made with the patient to discuss pharmacy services including being ready for the pharmacist to call at least 5 minutes before the scheduled appointment time and to have medication bottles and any blood pressure readings ready for review. The patient agreed to meet with the pharmacist via telephone visit on (date/time).08/12/2023  Penne Lash , RMA     [place]  [facility], [facility] Guide  Direct Dial: [phone]  Website: [email or web address] \

## 2023-07-22 NOTE — Telephone Encounter (Signed)
 Left message informing patient that an oop expense printout from pharmacy for 2025 only will need to be provided.   Gave call back number [phone] for any questions/concerns.

## 2023-07-28 ENCOUNTER — Encounter (HOSPITAL_BASED_OUTPATIENT_CLINIC_OR_DEPARTMENT_OTHER): Payer: Self-pay | Admitting: Pulmonary Disease

## 2023-08-02 ENCOUNTER — Other Ambulatory Visit: Payer: Self-pay | Admitting: Family Medicine

## 2023-08-02 DIAGNOSIS — I499 Cardiac arrhythmia, unspecified: Secondary | ICD-10-CM

## 2023-08-03 ENCOUNTER — Encounter: Payer: Self-pay | Admitting: Pharmacist

## 2023-08-04 NOTE — Progress Notes (Unsigned)
 Pharmacy Medication Assistance Program Note    08/11/2023  Patient ID: Kim Wilkerson, female  DOB: 05-26-1945, 77 y.o.  MRN:  [id number]     07/08/2023  Outreach Medication Two  Manufacturer Medication Two [employer]  [facility] (Med Two) Eliquis  Dose of Eliquis 5MG   Type of Assistance Manufacturer Assistance  Date Application Received From Patient 07/20/2023  Date Application Received From Provider 07/20/2023  Method Application Sent to Manufacturer Fax  Date Application Submitted to Manufacturer 08/04/2023  Patient Assistance Determination Denied  Patient Denial Reasons Has Not Met Out of Pocket Spend;Other     DENIED:     Patient will need to setup payment plan with her medicare insurance.

## 2023-08-10 ENCOUNTER — Ambulatory Visit: Payer: Self-pay | Admitting: *Deleted

## 2023-08-10 ENCOUNTER — Ambulatory Visit (HOSPITAL_BASED_OUTPATIENT_CLINIC_OR_DEPARTMENT_OTHER): Payer: Medicare PPO | Admitting: Pulmonary Disease

## 2023-08-10 NOTE — Telephone Encounter (Signed)
 I spoke to the Kim Wilkerson and she is scheduled with Marcelino Duster 08/11/23 at 8:50.

## 2023-08-10 NOTE — Telephone Encounter (Signed)
  Chief Complaint: right flank pain comes and goes. Only wants to see PCP  Symptoms: pain right side comes and goes. Pain level now 6-7/10 . Sunday pain 10/10. Pain can last approx 1 hour and after laying down will ease and go away. Frequency: 1 week  Pertinent Negatives: Patient denies difficulty breathing no chest pain reported no fever no N/V no pain or burning with urination .  Disposition: [] ED /[] Urgent Care (no appt availability in office) / [x] Appointment(In office/virtual)/ []  [place] [facility]/ [] Home Care/ [] Refused Recommended Disposition /[] [place] [facility]/ []  Follow-up with PCP Additional Notes:     Due to pain level at 6-7 now and pain comes and goes appt scheduled for 08/12/23 with PCP. Patient did not want to schedule with another provider tomorrow. Instructed patient if pain severe and lasts 1 hour again go to ED do not wait for appt Friday . Please advise.       Copied from CRM [id number]. Topic: Clinical - Red Word Triage >> Aug 10, 2023  4:13 PM Elle L wrote: Red Word that prompted transfer to Nurse Triage: The patient states her side has been hurting. It started a week ago but the pain has been off and on and worsening. Reason for Disposition  MODERATE pain (e.g., interferes with normal activities or awakens from sleep)  Answer Assessment - Initial Assessment Questions 1. LOCATION: "Where does it hurt?" (e.g., left, right)     Right flank pain 2. ONSET: "When did the pain start?"     Comes and goes for a week 3. SEVERITY: "How bad is the pain?" (e.g., Scale 1-10; mild, moderate, or severe)   - MILD (1-3): doesn't interfere with normal activities    - MODERATE (4-7): interferes with normal activities or awakens from sleep    - SEVERE (8-10): excruciating pain and patient unable to do normal activities (stays in bed)       Pain level can be 10/10 on Sunday now 6-7 /10 4. PATTERN: "Does the pain come and go, or is it constant?"      Comes and goes  5. CAUSE:  "What do you think is causing the pain?"     Not sure has had issues in the past but pain went away  6. OTHER SYMPTOMS:  "Do you have any other symptoms?" (e.g., fever, abdomen pain, vomiting, leg weakness, burning with urination, blood in urine)     Right flank pain comes and goes. Lasting approx 1 hour and goes away after laying down  7. PREGNANCY:  "Is there any chance you are pregnant?" "When was your last menstrual period?"     na  Protocols used: Flank Pain-A-AH

## 2023-08-10 NOTE — Telephone Encounter (Signed)
 I'm not here on Thursdays so sadly, an appt with me tomorrow cannot be accommodated. I'd recommend her see Kari Baars if she is willing to be seen tomorrow. Concerned for possible renal stone/ infection.

## 2023-08-11 ENCOUNTER — Encounter: Payer: Self-pay | Admitting: Family Medicine

## 2023-08-11 ENCOUNTER — Ambulatory Visit (INDEPENDENT_AMBULATORY_CARE_PROVIDER_SITE_OTHER): Admitting: Family Medicine

## 2023-08-11 ENCOUNTER — Ambulatory Visit (HOSPITAL_COMMUNITY)
Admission: RE | Admit: 2023-08-11 | Discharge: 2023-08-11 | Disposition: A | Discharge status: Home / Self Care | Source: Ambulatory Visit | Attending: Family Medicine | Admitting: Family Medicine

## 2023-08-11 ENCOUNTER — Encounter (HOSPITAL_COMMUNITY): Payer: Self-pay

## 2023-08-11 ENCOUNTER — Emergency Department (HOSPITAL_COMMUNITY)
Admission: EM | Admit: 2023-08-11 | Discharge: 2023-08-11 | Discharge status: Against Medical Advice | Attending: Emergency Medicine | Admitting: Emergency Medicine

## 2023-08-11 ENCOUNTER — Other Ambulatory Visit: Payer: Self-pay

## 2023-08-11 VITALS — BP 131/59 | HR 84 | Temp 97.3°F | Ht 68.0 in | Wt 257.0 lb

## 2023-08-11 DIAGNOSIS — K5730 Diverticulosis of large intestine without perforation or abscess without bleeding: Secondary | ICD-10-CM | POA: Insufficient documentation

## 2023-08-11 DIAGNOSIS — R1031 Right lower quadrant pain: Secondary | ICD-10-CM | POA: Insufficient documentation

## 2023-08-11 DIAGNOSIS — Z5321 Procedure and treatment not carried out due to patient leaving prior to being seen by health care provider: Secondary | ICD-10-CM | POA: Insufficient documentation

## 2023-08-11 DIAGNOSIS — R109 Unspecified abdominal pain: Secondary | ICD-10-CM | POA: Insufficient documentation

## 2023-08-11 DIAGNOSIS — R3129 Other microscopic hematuria: Secondary | ICD-10-CM | POA: Diagnosis present

## 2023-08-11 DIAGNOSIS — K449 Diaphragmatic hernia without obstruction or gangrene: Secondary | ICD-10-CM | POA: Insufficient documentation

## 2023-08-11 LAB — URINALYSIS, ROUTINE W REFLEX MICROSCOPIC
Bilirubin, UA: NEGATIVE
Glucose, UA: NEGATIVE
Leukocytes,UA: NEGATIVE
Nitrite, UA: NEGATIVE
Protein,UA: NEGATIVE
Specific Gravity, UA: 1.02 (ref 1.005–1.030)
Urobilinogen, Ur: 0.2 mg/dL (ref 0.2–1.0)
pH, UA: 5.5 (ref 5.0–7.5)

## 2023-08-11 LAB — MICROSCOPIC EXAMINATION
Renal Epithel, UA: NONE SEEN /HPF
Yeast, UA: NONE SEEN

## 2023-08-11 NOTE — Progress Notes (Signed)
 Subjective:  Patient ID: Kim Wilkerson, female    DOB: 12/02/1945, 78 y.o.   MRN: [id number]  Patient Care Team: Raliegh Ip, DO as PCP - General (Family Medicine) Oretha Milch, MD as Consulting Physician (Pulmonary Disease) Danella Maiers, [facility] as [facility] Care Management (Pharmacist)   Chief Complaint:  Abdominal Pain (Right sided abd pain that has been going on for a few weeks.  States it has been on and off pain. )   HPI: Kim Wilkerson is a 78 y.o. female presenting on 08/11/2023 for Abdominal Pain (Right sided abd pain that has been going on for a few weeks.  States it has been on and off pain. )   Discussed the use of AI scribe software for clinical note transcription with the patient, who gave verbal consent to proceed.  History of Present Illness   Kim Wilkerson is a 78 year old female who presents with right lower abdominal and flank pain.  She experiences intermittent severe pain in the right lower abdomen and right flank, described as sharp and incapacitating, which significantly limits her mobility. On some occasions, such as Sunday, the pain was so intense that she had to hold her abdomen and lie down. She has no history of similar pain in the past.  Associated symptoms include nausea and dark-colored urine. No dysuria, fever, or chills. Bowel movements are regular, with the last one occurring the previous night.  A urinalysis today revealed the presence of blood and ketones in her urine. She has no history of kidney stones and is unsure about what causes them. She attempts to maintain adequate hydration.          Relevant past medical, surgical, family, and social history reviewed and updated as indicated.  Allergies and medications reviewed and updated. Data reviewed: Chart in Epic.   Past Medical History:  Diagnosis Date   Arthritis    Functional heart murmur 2019   Hypertension 1980   Leg ulcer (HCC)    Morbid obesity (HCC)     Varicose veins     Past Surgical History:  Procedure Laterality Date   ABDOMINAL HYSTERECTOMY  1994   COLONOSCOPY N/A 07/01/2017   Procedure: COLONOSCOPY;  Surgeon: West Bali, MD;  Location: [facility];  Service: Endoscopy;  Laterality: N/A;  1:00   ESOPHAGEAL DILATION  11/2016   Dr Allena Katz in [place]    Social History   Socioeconomic History   Marital status: Widowed    Spouse name: Not on file   Number of children: 3   Years of education: 12   Highest education level: High school graduate  Occupational History   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  Tobacco Use   Smoking status: Former    Current packs/day: 0.00    Types: Cigarettes    Quit date: 05/11/1975    Years since quitting: 48.2   Smokeless tobacco: Never  Vaping Use   Vaping status: Never Used  Substance and Sexual Activity   Alcohol use: Yes    Alcohol/week: 0.0 standard drinks of alcohol    Comment: 1 glass of wine every 2-3 weeks   Drug use: No   Sexual activity: Not Currently    Birth control/protection: Surgical  Other Topics Concern   Not on file  Social History Narrative   Not on file   Social Drivers of Health   Financial Resource Strain: Low Risk  (10/13/2022)  Overall Financial Resource Strain (CARDIA)    Difficulty of Paying Living Expenses: Not hard at all  Food Insecurity: No Food Insecurity (10/13/2022)   Hunger Vital Sign    Worried About Running Out of Food in the Last Year: Never true    Ran Out of Food in the Last Year: Never true  Transportation Needs: No Transportation Needs (04/22/2023)   Received from [facility]   OASIS A1250: Transportation    Lack of Transportation (Medical): No    Lack of Transportation (Non-Medical): No    Patient Unable or Declines to Respond: No  Physical Activity: Inactive (10/13/2022)   Exercise Vital Sign    Days of Exercise per Week: 0 days    Minutes of Exercise per Session: 0 min  Stress: No Stress Concern Present (10/13/2022)    [employer] of Occupational Health - Occupational Stress Questionnaire    Feeling of Stress : Not at all  Social Connections: Moderately Isolated (10/13/2022)   Social Connection and Isolation Panel [NHANES]    Frequency of Communication with Friends and Family: More than three times a week    Frequency of Social Gatherings with Friends and Family: More than three times a week    Attends Religious Services: More than 4 times per year    Active Member of [employer] or Organizations: No    Attends Banker Meetings: Never    Marital Status: Widowed  Intimate Partner Violence: Not At Risk (10/13/2022)   Humiliation, Afraid, Rape, and Kick questionnaire    Fear of Current or Ex-Partner: No    Emotionally Abused: No    Physically Abused: No    Sexually Abused: No    Outpatient Encounter Medications as of 08/11/2023  Medication Sig   acetaminophen (TYLENOL) 650 MG CR tablet Take 650 mg by mouth every 8 (eight) hours as needed for pain.   Alcohol Swabs (DROPSAFE ALCOHOL PREP) 70 % PADS USE TO CHECK BLOOD GLUCOSE DAILY   apixaban (ELIQUIS) 2.5 MG TABS tablet TAKE 1 TABLET(2.5 MG) BY MOUTH TWICE DAILY   Ascorbic Acid 53 MG LOZG Take by mouth.   atorvastatin (LIPITOR) 40 MG tablet Take 1 tablet (40 mg total) by mouth daily.   Blood Glucose Monitoring Suppl DEVI Check BGs daily as directed. May substitute to any manufacturer covered by patient's insurance. E11.9   cyclobenzaprine (FLEXERIL) 5 MG tablet Take 1 tablet (5 mg total) by mouth 3 (three) times daily as needed for muscle spasms.   diclofenac Sodium (VOLTAREN) 1 % GEL Apply 4 g topically 4 (four) times daily. As needed for joint pain   diltiazem (CARDIZEM CD) 240 MG 24 hr capsule Take 1 capsule (240 mg total) by mouth daily.   famotidine (PEPCID) 40 MG tablet TAKE 1 TABLET TWICE DAILY   furosemide (LASIX) 40 MG tablet TAKE 1 TABLET EVERY DAY   Glucose Blood (BLOOD GLUCOSE TEST STRIPS) STRP TEST BLOOD SUGAR EVERY DAY   ibuprofen  (IBU) 800 MG tablet TAKE 1/2 TO 1 TABLET TWICE DAILY AS NEEDED FOR MODERATE PAIN   Lancet Device MISC Check BGs daily as directed.E11.9. May substitute to any manufacturer covered by patient's insurance.   Lancets Misc. MISC Check BGs once daily as directed. E11.9 May substitute to any manufacturer covered by patient's insurance.   lansoprazole (PREVACID) 30 MG capsule TAKE 1 CAPSULE EVERY DAY AT 12 NOON.   mirtazapine (REMERON) 7.5 MG tablet Take 1 tablet (7.5 mg total) by mouth at bedtime.   Semaglutide,0.25 or 0.5MG /DOS, (OZEMPIC,  0.25 OR 0.5 MG/DOSE,) 2 MG/3ML SOPN Inject 0.5 mg into the skin every 7 (seven) days.   TRUEplus Lancets 33G MISC CHECK BLOOD GLUCOSE DAILY   traMADol (ULTRAM) 50 MG tablet Take 1 tablet (50 mg total) by mouth 3 (three) times daily as needed. (Patient not taking: Reported on 08/11/2023)   No facility-administered encounter medications on file as of 08/11/2023.    Allergies  Allergen Reactions   Metformin And Related Diarrhea    Pertinent ROS per HPI, otherwise unremarkable      Objective:  BP (!) 131/59   Pulse 84   Temp (!) 97.3 F (36.3 C)   Ht 5\' 8"  (1.727 m)   Wt 257 lb (116.6 kg)   SpO2 98%   BMI 39.08 kg/m    Wt Readings from Last 3 Encounters:  08/11/23 257 lb (116.6 kg)  07/07/23 260 lb (117.9 kg)  02/14/23 269 lb (122 kg)    Physical Exam Vitals and nursing note reviewed.  Constitutional:      General: She is not in acute distress.    Appearance: She is well-developed. She is morbidly obese. She is not ill-appearing, toxic-appearing or diaphoretic.  HENT:     Head: Normocephalic and atraumatic.     Nose: Nose normal.     Mouth/Throat:     Mouth: Mucous membranes are moist.  Eyes:     Pupils: Pupils are equal, round, and reactive to light.  Cardiovascular:     Rate and Rhythm: Normal rate. Extrasystoles are present. Pulmonary:     Effort: Pulmonary effort is normal.     Breath sounds: Normal breath sounds.  Abdominal:      General: Bowel sounds are normal.     Palpations: Abdomen is soft.     Tenderness: There is right CVA tenderness. There is no left CVA tenderness.  Musculoskeletal:     Cervical back: Neck supple.     Right lower leg: 1+ Edema present.     Left lower leg: 1+ Edema present.  Skin:    General: Skin is warm and dry.     Capillary Refill: Capillary refill takes less than 2 seconds.  Neurological:     General: No focal deficit present.     Mental Status: She is alert and oriented to person, place, and time.     Gait: Gait abnormal (using cane).  Psychiatric:        Mood and Affect: Mood normal.        Behavior: Behavior normal. Behavior is cooperative.        Thought Content: Thought content normal.        Judgment: Judgment normal.      Results for orders placed or performed in visit on 07/07/23  CMP14+EGFR   Collection Time: 07/07/23 11:11 AM  Result Value Ref Range   Glucose 105 (H) 70 - 99 mg/dL   BUN 10 8 - 27 mg/dL   Creatinine, Ser 0.98 0.57 - 1.00 mg/dL   eGFR 68 >11 BJ/YNW/2.95   BUN/Creatinine Ratio 11 (L) 12 - 28   Sodium 142 134 - 144 mmol/L   Potassium 4.0 3.5 - 5.2 mmol/L   Chloride 102 96 - 106 mmol/L   CO2 25 20 - 29 mmol/L   Calcium 8.7 8.7 - 10.3 mg/dL   Total Protein 6.3 6.0 - 8.5 g/dL   Albumin 3.9 3.8 - 4.8 g/dL   Globulin, Total 2.4 1.5 - 4.5 g/dL   Bilirubin Total 0.7 0.0 - 1.2 mg/dL  Alkaline Phosphatase 177 (H) 44 - 121 IU/L   AST 11 0 - 40 IU/L   ALT 7 0 - 32 IU/L  Magnesium   Collection Time: 07/07/23 11:11 AM  Result Value Ref Range   Magnesium 1.9 1.6 - 2.3 mg/dL  TSH + free T4   Collection Time: 07/07/23 11:11 AM  Result Value Ref Range   TSH 1.700 0.450 - 4.500 uIU/mL   Free T4 1.17 0.82 - 1.77 ng/dL  CBC   Collection Time: 07/07/23 11:11 AM  Result Value Ref Range   WBC 5.6 3.4 - 10.8 x10E3/uL   RBC 4.37 3.77 - 5.28 x10E6/uL   Hemoglobin 10.7 (L) 11.1 - 15.9 g/dL   Hematocrit 16.1 09.6 - 46.6 %   MCV 80 79 - 97 fL   MCH 24.5 (L)  26.6 - 33.0 pg   MCHC 30.7 (L) 31.5 - 35.7 g/dL   RDW 04.5 (H) 40.9 - 81.1 %   Platelets 270 150 - 450 x10E3/uL  Hemoglobin A1c   Collection Time: 07/07/23 11:11 AM  Result Value Ref Range   Hgb A1c MFr Bld 5.5 4.8 - 5.6 %   Est. average glucose Bld gHb Est-mCnc 111 mg/dL       Pertinent labs & imaging results that were available during my care of the patient were reviewed by me and considered in my medical decision making.  Assessment & Plan:  Latrenda was seen today for abdominal pain.  Diagnoses and all orders for this visit:  Flank pain -     Urine Culture -     Urinalysis, Routine w reflex microscopic -     CT RENAL STONE STUDY; Future  Other microscopic hematuria -     CT RENAL STONE STUDY; Future  Right lower quadrant abdominal pain -     Urine Culture -     Urinalysis, Routine w reflex microscopic -     CT RENAL STONE STUDY; Future     Assessment and Plan    Right lower abdominal and flank pain Intermittent severe right lower abdominal and flank pain, sharp and incapacitating, requiring her to lie down. Differential diagnosis includes renal colic due to kidney stones, supported by hematuria, ketonuria, and dark urine. No fever, chills, or dysuria. Suspected kidney stone to be confirmed with imaging. Further treatment pending results.  - Order CT scan of the abdomen to evaluate for kidney stones and assess for any obstruction. - Schedule imaging and aim to complete it today.          Continue all other maintenance medications.  Follow up plan: Return if symptoms worsen or fail to improve.   Continue healthy lifestyle choices, including diet (rich in fruits, vegetables, and lean proteins, and low in salt and simple carbohydrates) and exercise (at least 30 minutes of moderate physical activity daily).    The above assessment and management plan was discussed with the patient. The patient verbalized understanding of and has agreed to the management plan. Patient  is aware to call the clinic if they develop any new symptoms or if symptoms persist or worsen. Patient is aware when to return to the clinic for a follow-up visit. Patient educated on when it is appropriate to go to the emergency department.   Kari Baars, FNP-C [facility] [phone]

## 2023-08-11 NOTE — ED Triage Notes (Signed)
 Pt arrived via pOV c/o right flank pain X few weeks. Pt reports pain is intermittent. Pt reports going to PCP office this morning and was advised to seek treatment in ER for CT Scan.

## 2023-08-12 ENCOUNTER — Other Ambulatory Visit

## 2023-08-12 ENCOUNTER — Ambulatory Visit: Admitting: Family Medicine

## 2023-08-12 NOTE — Telephone Encounter (Signed)
 Left message informing patient of BMS PAP denial. Suggested she reach out to [facility] to set up a medicare payment plan. Left call back number [phone] to discuss further in detail.

## 2023-08-13 LAB — URINE CULTURE

## 2023-08-16 NOTE — Telephone Encounter (Signed)
 Per novo [employer]ordisk rep, pages 2 & 3 were illegible. Refaxed to company 08/16/23.

## 2023-08-16 NOTE — Progress Notes (Unsigned)
 Subjective: CC:*** PCP: Raliegh Ip, DO ZOX:WRUE D Arko is a 78 y.o. female presenting to clinic today for:  1. ***   ROS: Per HPI  Allergies  Allergen Reactions   Metformin And Related Diarrhea   Past Medical History:  Diagnosis Date   Arthritis    Functional heart murmur 2019   Hypertension 1980   Leg ulcer (HCC)    Morbid obesity (HCC)    Varicose veins     Current Outpatient Medications:    acetaminophen (TYLENOL) 650 MG CR tablet, Take 650 mg by mouth every 8 (eight) hours as needed for pain., Disp: , Rfl:    Alcohol Swabs (DROPSAFE ALCOHOL PREP) 70 % PADS, USE TO CHECK BLOOD GLUCOSE DAILY, Disp: 100 each, Rfl: 0   apixaban (ELIQUIS) 2.5 MG TABS tablet, TAKE 1 TABLET(2.5 MG) BY MOUTH TWICE DAILY, Disp: 60 tablet, Rfl: 3   Ascorbic Acid 53 MG LOZG, Take by mouth., Disp: , Rfl:    atorvastatin (LIPITOR) 40 MG tablet, Take 1 tablet (40 mg total) by mouth daily., Disp: 90 tablet, Rfl: 3   Blood Glucose Monitoring Suppl DEVI, Check BGs daily as directed. May substitute to any manufacturer covered by patient's insurance. E11.9, Disp: 1 each, Rfl: 0   cyclobenzaprine (FLEXERIL) 5 MG tablet, Take 1 tablet (5 mg total) by mouth 3 (three) times daily as needed for muscle spasms., Disp: 30 tablet, Rfl: 1   diclofenac Sodium (VOLTAREN) 1 % GEL, Apply 4 g topically 4 (four) times daily. As needed for joint pain, Disp: 400 g, Rfl: 3   diltiazem (CARDIZEM CD) 240 MG 24 hr capsule, Take 1 capsule (240 mg total) by mouth daily., Disp: 90 capsule, Rfl: 3   famotidine (PEPCID) 40 MG tablet, TAKE 1 TABLET TWICE DAILY, Disp: 180 tablet, Rfl: 1   furosemide (LASIX) 40 MG tablet, TAKE 1 TABLET EVERY DAY, Disp: 90 tablet, Rfl: 3   Glucose Blood (BLOOD GLUCOSE TEST STRIPS) STRP, TEST BLOOD SUGAR EVERY DAY, Disp: 100 strip, Rfl: 3   ibuprofen (IBU) 800 MG tablet, TAKE 1/2 TO 1 TABLET TWICE DAILY AS NEEDED FOR MODERATE PAIN, Disp: 180 tablet, Rfl: 1   Lancet Device MISC, Check BGs  daily as directed.E11.9. May substitute to any manufacturer covered by patient's insurance., Disp: 1 each, Rfl: 0   Lancets Misc. MISC, Check BGs once daily as directed. E11.9 May substitute to any manufacturer covered by patient's insurance., Disp: 100 each, Rfl: 3   lansoprazole (PREVACID) 30 MG capsule, TAKE 1 CAPSULE EVERY DAY AT 12 NOON., Disp: 90 capsule, Rfl: 1   mirtazapine (REMERON) 7.5 MG tablet, Take 1 tablet (7.5 mg total) by mouth at bedtime., Disp: 90 tablet, Rfl: 3   Semaglutide,0.25 or 0.5MG /DOS, (OZEMPIC, 0.25 OR 0.5 MG/DOSE,) 2 MG/3ML SOPN, Inject 0.5 mg into the skin every 7 (seven) days., Disp: 9 mL, Rfl: 3   traMADol (ULTRAM) 50 MG tablet, Take 1 tablet (50 mg total) by mouth 3 (three) times daily as needed. 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  Tobacco Use   Smoking status: Former  Current packs/day: 0.00    Types: Cigarettes    Quit date: 05/11/1975    Years since quitting: 48.2   Smokeless tobacco: Never  Vaping Use   Vaping status: Never Used  Substance and Sexual Activity   Alcohol use: Yes    Alcohol/week: 0.0 standard drinks of alcohol    Comment: 1 glass of wine every 2-3 weeks   Drug use: No   Sexual activity: Not Currently    Birth control/protection: Surgical  Other Topics Concern   Not on file  Social History Narrative   Not on file   Social Drivers of Health   Financial Resource Strain: Low Risk  (10/13/2022)   Overall Financial Resource Strain (CARDIA)    Difficulty of Paying Living Expenses: Not hard at all  Food Insecurity: No Food Insecurity (10/13/2022)   Hunger Vital Sign     Worried About Running Out of Food in the Last Year: Never true    Ran Out of Food in the Last Year: Never true  Transportation Needs: No Transportation Needs (04/22/2023)   Received from [facility]   OASIS A1250: Transportation    Lack of Transportation (Medical): No    Lack of Transportation (Non-Medical): No    Patient Unable or Declines to Respond: No  Physical Activity: Inactive (10/13/2022)   Exercise Vital Sign    Days of Exercise per Week: 0 days    Minutes of Exercise per Session: 0 min  Stress: No Stress Concern Present (10/13/2022)   [employer] of Occupational Health - Occupational Stress Questionnaire    Feeling of Stress : Not at all  Social Connections: Moderately Isolated (10/13/2022)   Social Connection and Isolation Panel [NHANES]    Frequency of Communication with Friends and Family: More than three times a week    Frequency of Social Gatherings with Friends and Family: More than three times a week    Attends Religious Services: More than 4 times per year    Active Member of [employer] or Organizations: No    Attends Banker Meetings: Never    Marital Status: Widowed  Intimate Partner Violence: Not At Risk (10/13/2022)   Humiliation, Afraid, Rape, and Kick questionnaire    Fear of Current or Ex-Partner: No    Emotionally Abused: No    Physically Abused: No    Sexually Abused: No   Family History  Problem Relation Age of Onset   Hypertension Mother    Stroke Mother        after 30   Hyperlipidemia Mother    Thyroid disease Mother    Hypertension Father    Cirrhosis Father    Alcohol abuse Father    Heart attack Sister 9   Hypertension Brother    Ovarian cancer Neg Hx    Breast cancer Neg Hx    Prostate cancer Neg Hx    Colon cancer Neg Hx     Objective: Office vital signs reviewed. There were no vitals taken for this visit.  Physical Examination:  General: Awake, alert, *** nourished, No acute distress HEENT: Normal    Neck: No  masses palpated. No lymphadenopathy    Ears: Tympanic membranes intact, normal light reflex, no erythema, no bulging    Eyes: PERRLA, extraocular membranes intact, sclera ***    Nose: nasal turbinates moist, *** nasal discharge    Throat: moist mucus membranes, no erythema, *** tonsillar exudate.  Airway is patent Cardio: regular rate and rhythm, S1S2 heard, no murmurs appreciated Pulm: clear  to auscultation bilaterally, no wheezes, rhonchi or rales; normal work of breathing on room air GI: soft, non-tender, non-distended, bowel sounds present x4, no hepatomegaly, no splenomegaly, no masses GU: external vaginal tissue ***, cervix ***, *** punctate lesions on cervix appreciated, *** discharge from cervical os, *** bleeding, *** cervical motion tenderness, *** abdominal/ adnexal masses Extremities: warm, well perfused, No edema, cyanosis or clubbing; +*** pulses bilaterally MSK: *** gait and *** station Skin: dry; intact; no rashes or lesions Neuro: *** Strength and light touch sensation grossly intact, *** DTRs ***/4  Assessment/ Plan: 78 y.o. female   Anemia, unspecified type  ***   Kim Defina Hulen Skains, DO [facility] [phone]

## 2023-08-17 ENCOUNTER — Encounter: Payer: Self-pay | Admitting: Family Medicine

## 2023-08-17 ENCOUNTER — Ambulatory Visit (INDEPENDENT_AMBULATORY_CARE_PROVIDER_SITE_OTHER): Admitting: Family Medicine

## 2023-08-17 VITALS — BP 138/78 | HR 60 | Temp 98.8°F | Ht 68.0 in | Wt 258.0 lb

## 2023-08-17 DIAGNOSIS — D649 Anemia, unspecified: Secondary | ICD-10-CM

## 2023-08-17 DIAGNOSIS — R252 Cramp and spasm: Secondary | ICD-10-CM | POA: Diagnosis not present

## 2023-08-17 DIAGNOSIS — R1031 Right lower quadrant pain: Secondary | ICD-10-CM

## 2023-08-17 MED ORDER — CYCLOBENZAPRINE HCL 5 MG PO TABS
5.0000 mg | ORAL_TABLET | Freq: Three times a day (TID) | ORAL | 1 refills | Status: DC | PRN
Start: 2023-08-17 — End: 2024-02-13

## 2023-08-18 ENCOUNTER — Encounter: Payer: Self-pay | Admitting: Family Medicine

## 2023-08-18 LAB — ANEMIA PROFILE B
Basophils Absolute: 0 10*3/uL (ref 0.0–0.2)
Basos: 1 %
EOS (ABSOLUTE): 0.1 10*3/uL (ref 0.0–0.4)
Eos: 2 %
Ferritin: 52 ng/mL (ref 15–150)
Folate: 8.2 ng/mL (ref >3.0)
Hematocrit: 34.7 % (ref 34.0–46.6)
Hemoglobin: 11.3 g/dL (ref 11.1–15.9)
Immature Grans (Abs): 0 10*3/uL (ref 0.0–0.1)
Immature Granulocytes: 0 %
Iron Saturation: 14 % — ABNORMAL LOW (ref 15–55)
Iron: 42 ug/dL (ref 27–139)
Lymphocytes Absolute: 1.4 10*3/uL (ref 0.7–3.1)
Lymphs: 26 %
MCH: 25.5 pg — ABNORMAL LOW (ref 26.6–33.0)
MCHC: 32.6 g/dL (ref 31.5–35.7)
MCV: 78 fL — ABNORMAL LOW (ref 79–97)
Monocytes Absolute: 0.5 10*3/uL (ref 0.1–0.9)
Monocytes: 8 %
Neutrophils Absolute: 3.5 10*3/uL (ref 1.4–7.0)
Neutrophils: 63 %
Platelets: 279 10*3/uL (ref 150–450)
RBC: 4.44 x10E6/uL (ref 3.77–5.28)
RDW: 15.6 % — ABNORMAL HIGH (ref 11.7–15.4)
Retic Ct Pct: 1 % (ref 0.6–2.6)
Total Iron Binding Capacity: 302 ug/dL (ref 250–450)
UIBC: 260 ug/dL (ref 118–369)
Vitamin B-12: 1249 pg/mL — ABNORMAL HIGH (ref 232–1245)
WBC: 5.4 10*3/uL (ref 3.4–10.8)

## 2023-08-24 ENCOUNTER — Other Ambulatory Visit: Payer: Self-pay | Admitting: Family Medicine

## 2023-08-24 DIAGNOSIS — E119 Type 2 diabetes mellitus without complications: Secondary | ICD-10-CM

## 2023-08-24 NOTE — Telephone Encounter (Signed)
 Per novo [employer] rep, pages 1 & 5 were aloso illegible. Refaxed to company 08/24/23.

## 2023-08-25 ENCOUNTER — Encounter: Payer: Self-pay | Admitting: *Deleted

## 2023-09-09 NOTE — Telephone Encounter (Signed)
 Rep was able to process application; all signatures clear and valid.

## 2023-09-09 NOTE — Progress Notes (Signed)
 Pharmacy Medication Assistance Program Note    09/09/2023  Patient ID: Kim Wilkerson, female   DOB: 21-Nov-1945, 78 y.o.   MRN: [id number]     07/08/2023  Outreach Medication One  Manufacturer Medication One Novo Nordisk  Nordisk Drugs Ozempic   Dose of Ozempic  0.5mg   Type of Radiographer, therapeutic Assistance  Date Application Received From Patient 07/20/2023  Date Application Received From Provider 07/08/2023  Date Application Submitted to Manufacturer 07/20/2023  Method Application Sent to Manufacturer Fax  Patient Assistance Determination Approved  Approval Start Date 09/09/2023  Approval End Date 05/09/2024     Renewal approved. Should arrive to office in 10-14 business days.

## 2023-09-11 ENCOUNTER — Other Ambulatory Visit: Payer: Self-pay | Admitting: Family Medicine

## 2023-09-11 DIAGNOSIS — G8929 Other chronic pain: Secondary | ICD-10-CM

## 2023-09-11 DIAGNOSIS — K219 Gastro-esophageal reflux disease without esophagitis: Secondary | ICD-10-CM

## 2023-10-07 ENCOUNTER — Telehealth: Payer: Self-pay | Admitting: Family Medicine

## 2023-10-07 NOTE — Telephone Encounter (Signed)
 I called and left message for patient making her aware that we have her Insulin order that needs to be picked up asap.

## 2023-10-17 ENCOUNTER — Ambulatory Visit: Admitting: Internal Medicine

## 2023-11-16 ENCOUNTER — Telehealth: Payer: Self-pay | Admitting: Family Medicine

## 2023-11-22 ENCOUNTER — Other Ambulatory Visit: Payer: Self-pay | Admitting: Family Medicine

## 2023-11-22 DIAGNOSIS — E119 Type 2 diabetes mellitus without complications: Secondary | ICD-10-CM

## 2023-11-24 ENCOUNTER — Encounter (HOSPITAL_BASED_OUTPATIENT_CLINIC_OR_DEPARTMENT_OTHER): Payer: Self-pay | Admitting: Pulmonary Disease

## 2023-11-24 ENCOUNTER — Ambulatory Visit (HOSPITAL_BASED_OUTPATIENT_CLINIC_OR_DEPARTMENT_OTHER): Admitting: Pulmonary Disease

## 2023-11-24 VITALS — BP 122/67 | HR 81 | Ht <= 58 in | Wt 252.0 lb

## 2023-11-24 DIAGNOSIS — G4733 Obstructive sleep apnea (adult) (pediatric): Secondary | ICD-10-CM | POA: Diagnosis not present

## 2023-11-24 DIAGNOSIS — I2693 Single subsegmental pulmonary embolism without acute cor pulmonale: Secondary | ICD-10-CM | POA: Diagnosis not present

## 2023-11-24 NOTE — Patient Instructions (Signed)
  VISIT SUMMARY: You came in today for your one-year follow-up appointment. We discussed your obstructive sleep apnea and your history of pulmonary embolism. You shared your discomfort with your current CPAP mask and the warmth of the air from the machine. You also expressed concerns about the cost of your anticoagulation therapy and your desire to discontinue it.  YOUR PLAN: -PULMONARY EMBOLISM: A pulmonary embolism is a blockage in one of the pulmonary arteries in your lungs, often caused by blood clots that travel to your lungs from your legs or other parts of your body. 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 You will complete your current supply of anticoagulation therapy and then switch to taking aspirin 81 mg daily. We will order a blood test one month after you stop the anticoagulation therapy to assess your risk of recurrence and determine if you need to resume the anticoagulation therapy.  -OBSTRUCTIVE SLEEP APNEA: Obstructive sleep apnea is a condition where your breathing repeatedly stops and starts during sleep due to blocked airways. You have been experiencing discomfort with your current CPAP mask and the warm air from the machine. We will prescribe a nasal-only CPAP mask for you, and you should contact your CPAP provider to adjust the temperature settings for cooler air.  INSTRUCTIONS: Please complete your current supply of anticoagulation therapy and then switch to taking aspirin 81 mg daily. Schedule a blood test one month after stopping the anticoagulation therapy to assess your risk of recurrence. Contact your CPAP provider to get a nasal-only mask and adjust the temperature settings for cooler air.                      Contains text generated by [employer].                                 Contains text generated by [employer].

## 2023-11-24 NOTE — Progress Notes (Signed)
 Subjective:    Patient ID: Kim Wilkerson, female    DOB: 10-05-1945, 78 y.o.   MRN: [id number]   78 yo never smoker for follow-up of shortness of breath and OSA she had wt gain from baseline of 238 to a peak of 297 .    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 -Hypertension Type 2 diabetes OSA was diagnosed by [facility] neurology, she is maintained on CPAP with a fullface mask, DME is [facility] She is a remote smoker and smoked less than 10 pack years before she quit in 1977 Discussed the use of AI scribe software for clinical note transcription with the patient, who gave verbal consent to proceed.  History of Present Illness Kim Wilkerson is a 78 year old female with obstructive sleep apnea and a history of pulmonary embolism who presents for a one-year follow-up.  She experiences discomfort with her current CPAP mask, which covers both her mouth and nose, causing a sensation of being smothered. She prefers a nasal mask. The air from the machine feels too warm, affecting her sleep comfort.  She has been on anticoagulation therapy for the past year following a pulmonary embolism. She wishes to discontinue the medication due to its cost. She recalls a previous deep vein thrombosis approximately 20 years ago.     Significant tests/ events reviewed   NPSG 04/2020 moderate, AHI 16/hour, RDI 29/hour, low sat 81%, mild PLM's 11/2022 echo normal LV function, RV SF mildly decreased, RVSP 49   10/2022 venous doppler neg DVT, bakers cyst    Review of Systems  neg for any significant sore throat, dysphagia, itching, sneezing, nasal congestion or excess/ purulent secretions, fever, chills, sweats, unintended wt loss, pleuritic or exertional cp,  hempoptysis, orthopnea pnd or change in chronic leg swelling. Also denies presyncope, palpitations, heartburn, abdominal pain, nausea, vomiting, diarrhea or change in bowel or urinary habits, dysuria,hematuria, rash, arthralgias, visual complaints, headache, numbness weakness or ataxia.      Objective:   Physical Exam  Gen. Pleasant, obese, in no distress, normal affect ENT - no pallor,icterus, no post nasal drip, class 2-3 airway Neck: No JVD, no thyromegaly, no carotid bruits Lungs: no use of accessory muscles, no dullness to percussion, decreased without rales or rhonchi  Cardiovascular: Rhythm regular, heart sounds  normal, no murmurs or gallops, 1+ peripheral edema Abdomen: soft and non-tender, no hepatosplenomegaly, BS normal. Musculoskeletal: No deformities, no cyanosis or clubbing Neuro:  alert, non focal, no tremors        Assessment & Plan:   Assessment and Plan Assessment & Plan Pulmonary Embolism Pulmonary embolism with a history of deep vein thrombosis approximately 20 years ago. Currently on anticoagulation therapy for one year. Considering discontinuation due to cost concerns ($141 per month). Risk of recurrence is higher than average due to two thrombotic events, but remains relatively low (4-5%). Aspirin is a potential alternative but less effective than anticoagulation therapy. Medical decision making involves assessing risk of recurrence with a blood test after stopping anticoagulation therapy. - Complete current supply of anticoagulation therapy. - Switch to aspirin 81 mg daily after completing anticoagulation therapy. - Order blood test one month after stopping anticoagulation therapy to assess risk of recurrence. - Determine necessity of  resuming anticoagulation therapy based on blood test results.  Obstructive Sleep Apnea Obstructive sleep apnea with difficulty using CPAP machine due to discomfort with current mask and temperature settings. Current mask covers  both mouth and nose, causing a sensation of smothering and discomfort due to hot air. Prefers a nasal-only mask and cooler air settings. - Prescribe nasal-only CPAP mask. - Instruct her to contact CPAP provider to adjust temperature settings for cooler air.

## 2023-12-09 ENCOUNTER — Telehealth: Payer: Self-pay

## 2023-12-09 NOTE — Telephone Encounter (Signed)
 In process of completing refill form.

## 2023-12-19 ENCOUNTER — Other Ambulatory Visit (HOSPITAL_COMMUNITY): Payer: Self-pay

## 2023-12-22 ENCOUNTER — Other Ambulatory Visit (HOSPITAL_COMMUNITY): Payer: Self-pay

## 2024-01-10 ENCOUNTER — Ambulatory Visit: Attending: Internal Medicine | Admitting: Internal Medicine

## 2024-01-10 ENCOUNTER — Encounter: Payer: Self-pay | Admitting: Internal Medicine

## 2024-01-10 VITALS — BP 128/68 | HR 78 | Ht 68.0 in | Wt 260.6 lb

## 2024-01-10 DIAGNOSIS — R002 Palpitations: Secondary | ICD-10-CM | POA: Diagnosis not present

## 2024-01-10 DIAGNOSIS — I272 Pulmonary hypertension, unspecified: Secondary | ICD-10-CM

## 2024-01-10 DIAGNOSIS — Z86718 Personal history of other venous thrombosis and embolism: Secondary | ICD-10-CM | POA: Diagnosis not present

## 2024-01-10 DIAGNOSIS — I499 Cardiac arrhythmia, unspecified: Secondary | ICD-10-CM

## 2024-01-10 NOTE — Progress Notes (Unsigned)
 Cardiology Office Note  Date: 01/10/2024   ID: Kim Wilkerson, DOB 1945-10-24, MRN [id number]  PCP:  Jolinda Norene HERO, DO  Cardiologist:  Diannah SHAUNNA Maywood, MD Electrophysiologist:  None   History of Present Illness: Kim Wilkerson is a 78 y.o. female  Referred to cardiology clinic for evaluation of palpitations and abnormal event monitor.  Event monitor in March 2025 showed 4 runs of NSVT occurred (fastest interval lasting 10 beats and the longest interval lasting 11 beats), 317 runs of SVT occurred (fastest interval lasting 12 beats and longest interval lasting 20 beats). 5.6% PAC burden and 11.2% PVC burden.  Patient reported having palpitations only when she gets upset.  Otherwise she does not have any palpitations.  No dizziness, syncope either.  No angina or DOE.  No leg swelling.    Past Medical History:  Diagnosis Date   Arthritis    Functional heart murmur 2019   Hypertension 1980   Leg ulcer (HCC)    Morbid obesity (HCC)    Varicose veins     Past Surgical History:  Procedure Laterality Date   ABDOMINAL HYSTERECTOMY  1994   COLONOSCOPY N/A 07/01/2017   Procedure: COLONOSCOPY;  Surgeon: Harvey Margo CROME, MD;  Location: [facility];  Service: Endoscopy;  Laterality: N/A;  1:00   ESOPHAGEAL DILATION  11/2016   Dr Tobie in [place]    Current Outpatient Medications  Medication Sig Dispense Refill   acetaminophen (TYLENOL) 650 MG CR tablet Take 650 mg by mouth every 8 (eight) hours as needed for pain.     Alcohol Swabs (DROPSAFE ALCOHOL PREP) 70 % PADS USE TO CHECK BLOOD GLUCOSE DAILY 100 each 3   apixaban  (ELIQUIS ) 2.5 MG TABS tablet TAKE 1 TABLET(2.5 MG) BY MOUTH TWICE DAILY 60 tablet 3   Ascorbic Acid 53 MG LOZG Take by mouth.     atorvastatin  (LIPITOR) 40 MG tablet Take 1 tablet (40 mg total) by mouth daily. 90 tablet 3   Blood Glucose Monitoring Suppl DEVI Check BGs daily as directed. May substitute to any manufacturer covered by patient's insurance.  E11.9 1 each 0   cyclobenzaprine  (FLEXERIL ) 5 MG tablet Take 1 tablet (5 mg total) by mouth 3 (three) times daily as needed for muscle spasms. 30 tablet 1   diclofenac  Sodium (VOLTAREN ) 1 % GEL Apply 4 g topically 4 (four) times daily. As needed for joint pain 400 g 3   diltiazem  (CARDIZEM  CD) 240 MG 24 hr capsule Take 1 capsule (240 mg total) by mouth daily. 90 capsule 3   famotidine  (PEPCID ) 40 MG tablet TAKE 1 TABLET TWICE DAILY 180 tablet 1   furosemide  (LASIX ) 40 MG tablet TAKE 1 TABLET EVERY DAY 90 tablet 3   Glucose Blood (BLOOD GLUCOSE TEST STRIPS) STRP TEST BLOOD SUGAR EVERY DAY 100 strip 3   ibuprofen  (ADVIL ) 800 MG tablet TAKE 1/2 TO 1 TABLET TWICE DAILY AS NEEDED FOR MODERATE PAIN (Patient not taking: Reported on 11/24/2023) 180 tablet 3   Lancet Device MISC Check BGs daily as directed.E11.9. May substitute to any manufacturer covered by patient's insurance. 1 each 0   Lancets Misc. MISC Check BGs once daily as directed. E11.9 May substitute to any manufacturer covered by patient's insurance. 100 each 3   lansoprazole  (PREVACID ) 30 MG capsule TAKE 1 CAPSULE EVERY DAY AT 12 NOON 90 capsule 3   mirtazapine  (REMERON ) 7.5 MG tablet Take 1 tablet (7.5 mg total) by mouth at bedtime. 90 tablet 3  Semaglutide ,0.25 or 0.5MG /DOS, (OZEMPIC , 0.25 OR 0.5 MG/DOSE,) 2 MG/3ML SOPN Inject 0.5 mg into the skin once a week. **NEEDS TO BE SEEN BEFORE NEXT REFILL** 3 mL 0   traMADol  (ULTRAM ) 50 MG tablet Take 1 tablet (50 mg total) by mouth 3 (three) times daily as needed. (Patient not taking: Reported on 11/24/2023) 30 tablet 0   TRUEplus Lancets 33G MISC CHECK BLOOD GLUCOSE DAILY 100 each 3   No current facility-administered medications for this visit.   Allergies:  Metformin  and related   Social History: The patient  reports that she quit smoking about 48 years ago. Her smoking use included cigarettes. She has never used smokeless tobacco. She reports current alcohol use. She reports that she does not  use drugs.   Family History: The patient's family history includes Alcohol abuse in her father; Cirrhosis in her father; Heart attack (age of onset: 53) in her sister; Hyperlipidemia in her mother; Hypertension in her brother, father, and mother; Stroke in her mother; Thyroid  disease in her mother.   ROS:  Please see the history of present illness. Otherwise, complete review of systems is positive for none  All other systems are reviewed and negative.   Physical Exam: VS:  There were no vitals taken for this visit., BMI There is no height or weight on file to calculate BMI.  Wt Readings from Last 3 Encounters:  11/24/23 252 lb (114.3 kg)  08/17/23 258 lb (117 kg)  08/11/23 257 lb (116.6 kg)    General: Patient appears comfortable at rest. HEENT: Conjunctiva and lids normal, oropharynx clear with moist mucosa. Neck: Supple, no elevated JVP or carotid bruits, no thyromegaly. Lungs: Clear to auscultation, nonlabored breathing at rest. Cardiac: Regular rate and rhythm, no S3 or significant systolic murmur, no pericardial rub. Abdomen: Soft, nontender, no hepatomegaly, bowel sounds present, no guarding or rebound. Extremities: No pitting edema, distal pulses 2+. Skin: Warm and dry. Musculoskeletal: No kyphosis. Neuropsychiatric: Alert and oriented x3, affect grossly appropriate.  Recent Labwork: 07/07/2023: ALT 7; AST 11; BUN 10; Creatinine, Ser 0.89; Magnesium 1.9; Potassium 4.0; Sodium 142; TSH 1.700 08/17/2023: Hemoglobin 11.3; Platelets 279     Component Value Date/Time   CHOL 125 07/06/2022 1019   TRIG 64 07/06/2022 1019   HDL 50 07/06/2022 1019   CHOLHDL 2.5 07/06/2022 1019   LDLCALC 61 07/06/2022 1019   LDLDIRECT 50 08/05/2017 1200    Other Studies Reviewed Today:  Event monitor in March 2025 4 runs of NSVT occurred (fastest interval lasting 10 beats and the longest interval lasting 11 beats), 317 runs of SVT occurred (fastest interval lasting 12 beats and longest interval  lasting 20 beats). 5.6% PAC burden and 11.2% PVC burden.   Assessment and Plan:  Palpitations - Event monitor in March 2025 showed 4 runs of NSVT occurred (fastest interval lasting 10 beats and the longest interval lasting 11 beats), 317 runs of SVT occurred (fastest interval lasting 12 beats and longest interval lasting 20 beats). 5.6% PAC burden and 11.2% PVC burden. - Patient reports palpitations only when she gets upset.  Otherwise she denies having any palpitations. - Continue diltiazem  240 mg once daily.  Would not recommend any uptitration of rate controlling agents at this time unless she is symptomatic.  Moderate pulmonary hypertension - Echocardiogram July 2024 reviewed.  Low normal LVEF, 50%, G1 DD with elevated LVEDP, mildly reduced RV systolic function with normal size, moderate elevation of PASP.  She also has bilateral leg swelling, likely lymphedema. - Will  update echocardiogram.  History of DVT/PE - Continue Eliquis  2.5 mg twice daily, follows with PCP.  I spent 40 minutes reviewing the prior records, imaging, reports, more than 3 labs, discussion of the above problems with the patient and documentation.     Medication Adjustments/Labs and Tests Ordered: Current medicines are reviewed at length with the patient today.  Concerns regarding medicines are outlined above.    Disposition:  Follow up pending results  Signed Natalie Leclaire Priya Amina Menchaca, MD, 01/10/2024 3:07 PM    [facility] [address] [place], [place], [place] [address]

## 2024-01-10 NOTE — Patient Instructions (Addendum)
 Medication Instructions:  Your physician recommends that you continue on your current medications as directed. Please refer to the Current Medication list given to you today.   Labwork: None  Testing/Procedures: Your physician has requested that you have an echocardiogram. Echocardiography is a painless test that uses sound waves to create images of your heart. It provides your doctor with information about the size and shape of your heart and how well your heart's chambers and valves are working. This procedure takes approximately one hour. There are no restrictions for this procedure. Please do NOT wear cologne, perfume, aftershave, or lotions (deodorant is allowed). Please arrive 15 minutes prior to your appointment time.  Please note: We ask at that you not bring children with you during ultrasound (echo/ vascular) testing. Due to room size and safety concerns, children are not allowed in the ultrasound rooms during exams. Our front office staff cannot provide observation of children in our lobby area while testing is being conducted. An adult accompanying a patient to their appointment will only be allowed in the ultrasound room at the discretion of the ultrasound technician under special circumstances. We apologize for any inconvenience.   Follow-Up: Your physician recommends that you schedule a follow-up appointment in: Pending Results  Any Other Special Instructions Will Be Listed Below (If Applicable).  Thank you for choosing [facility]!     If you need a refill on your cardiac medications before your next appointment, please call your pharmacy.

## 2024-01-13 DIAGNOSIS — Z86718 Personal history of other venous thrombosis and embolism: Secondary | ICD-10-CM | POA: Insufficient documentation

## 2024-01-13 DIAGNOSIS — I272 Pulmonary hypertension, unspecified: Secondary | ICD-10-CM | POA: Insufficient documentation

## 2024-01-13 DIAGNOSIS — R002 Palpitations: Secondary | ICD-10-CM | POA: Insufficient documentation

## 2024-01-16 NOTE — Telephone Encounter (Signed)
 Faxed completed form to [employer]

## 2024-01-23 ENCOUNTER — Other Ambulatory Visit: Payer: Self-pay | Admitting: Family Medicine

## 2024-01-23 DIAGNOSIS — E119 Type 2 diabetes mellitus without complications: Secondary | ICD-10-CM

## 2024-01-23 NOTE — Telephone Encounter (Signed)
 Copied from CRM [id number]. Topic: Clinical - Medication Refill >> Jan 23, 2024  4:29 PM Merlynn A wrote: Medication: Semaglutide ,0.25 or 0.5MG /DOS, (OZEMPIC , 0.25 OR 0.5 MG/DOSE,) 2 MG/3ML SOPN  Has the patient contacted their pharmacy? Yes (Agent: If no, request that the patient contact the pharmacy for the refill. If patient does not wish to contact the pharmacy document the reason why and proceed with request.) (Agent: If yes, when and what did the pharmacy advise?)  This is the patient's preferred pharmacy:  [id number] - [place], [place] - [address] AT [facility] [address] [place] [address] Phone: [phone] Fax: [phone]  Is this the correct pharmacy for this prescription? Yes If no, delete pharmacy and type the correct one.   Has the prescription been filled recently? No  Is the patient out of the medication? Yes  Has the patient been seen for an appointment in the last year OR does the patient have an upcoming appointment? Yes  Can we respond through MyChart? Yes  Agent: Please be advised that Rx refills may take up to 3 business days. We ask that you follow-up with your pharmacy.

## 2024-01-24 ENCOUNTER — Telehealth: Payer: Self-pay | Admitting: Pharmacist

## 2024-01-24 NOTE — Telephone Encounter (Signed)
   Patient enrolled in the [employer] patient assistance program for Ozempic .  Updated RX refills completed and faxed.  Patient's ozempic  supply is ready for pick up as well.  Patient notified.  Med list updated and Active FYI tab.  Encouraged patient to make a follow up appt with PharmD before the end of the year.  Shabazz Mckey Dattero Sophonie Goforth, PharmD, BCACP, CPP Clinical Pharmacist, [facility]

## 2024-01-25 NOTE — Telephone Encounter (Signed)
 Patient MUST have an OV.  She has not had diabetes check up since 06/2023.

## 2024-01-26 ENCOUNTER — Ambulatory Visit

## 2024-01-26 ENCOUNTER — Encounter

## 2024-01-26 ENCOUNTER — Encounter: Payer: Self-pay | Admitting: Internal Medicine

## 2024-01-26 NOTE — Progress Notes (Signed)
 This encounter was created in error - please disregard.

## 2024-01-28 ENCOUNTER — Other Ambulatory Visit: Payer: Self-pay | Admitting: Family Medicine

## 2024-01-28 DIAGNOSIS — I152 Hypertension secondary to endocrine disorders: Secondary | ICD-10-CM

## 2024-01-28 DIAGNOSIS — E1169 Type 2 diabetes mellitus with other specified complication: Secondary | ICD-10-CM

## 2024-01-30 ENCOUNTER — Encounter: Payer: Self-pay | Admitting: Family Medicine

## 2024-01-30 NOTE — Telephone Encounter (Signed)
 NA letter mailed

## 2024-01-30 NOTE — Telephone Encounter (Signed)
 [employer] NTBS for 6 mos FU NO RF sent to mail order pharmacy

## 2024-02-07 ENCOUNTER — Telehealth: Payer: Self-pay | Admitting: Internal Medicine

## 2024-02-07 NOTE — Telephone Encounter (Signed)
 Called patient to re-schedule Echo. No answer. A no show letter was mailed out to patient.

## 2024-02-09 NOTE — Telephone Encounter (Signed)
 Appt made on 10-06

## 2024-02-13 ENCOUNTER — Encounter: Payer: Self-pay | Admitting: Family Medicine

## 2024-02-13 ENCOUNTER — Ambulatory Visit (INDEPENDENT_AMBULATORY_CARE_PROVIDER_SITE_OTHER): Admitting: Family Medicine

## 2024-02-13 ENCOUNTER — Ambulatory Visit: Payer: Self-pay | Admitting: Family Medicine

## 2024-02-13 VITALS — BP 134/72 | HR 68 | Temp 97.9°F | Ht 68.0 in | Wt 262.5 lb

## 2024-02-13 DIAGNOSIS — Z23 Encounter for immunization: Secondary | ICD-10-CM

## 2024-02-13 DIAGNOSIS — E785 Hyperlipidemia, unspecified: Secondary | ICD-10-CM

## 2024-02-13 DIAGNOSIS — I152 Hypertension secondary to endocrine disorders: Secondary | ICD-10-CM

## 2024-02-13 DIAGNOSIS — E119 Type 2 diabetes mellitus without complications: Secondary | ICD-10-CM

## 2024-02-13 DIAGNOSIS — R252 Cramp and spasm: Secondary | ICD-10-CM | POA: Diagnosis not present

## 2024-02-13 DIAGNOSIS — E1169 Type 2 diabetes mellitus with other specified complication: Secondary | ICD-10-CM

## 2024-02-13 DIAGNOSIS — Z7985 Long-term (current) use of injectable non-insulin antidiabetic drugs: Secondary | ICD-10-CM

## 2024-02-13 DIAGNOSIS — W19XXXA Unspecified fall, initial encounter: Secondary | ICD-10-CM | POA: Diagnosis not present

## 2024-02-13 DIAGNOSIS — E1159 Type 2 diabetes mellitus with other circulatory complications: Secondary | ICD-10-CM

## 2024-02-13 DIAGNOSIS — Z5329 Procedure and treatment not carried out because of patient's decision for other reasons: Secondary | ICD-10-CM

## 2024-02-13 LAB — BAYER DCA HB A1C WAIVED: HB A1C (BAYER DCA - WAIVED): 5.4 % (ref 4.8–5.6)

## 2024-02-13 MED ORDER — OZEMPIC (0.25 OR 0.5 MG/DOSE) 2 MG/3ML ~~LOC~~ SOPN: 0.5000 mg | PEN_INJECTOR | SUBCUTANEOUS | 0 refills | Status: AC

## 2024-02-13 MED ORDER — CYCLOBENZAPRINE HCL 5 MG PO TABS: 5.0000 mg | ORAL_TABLET | Freq: Three times a day (TID) | ORAL | 1 refills | Status: AC | PRN

## 2024-02-13 NOTE — Addendum Note (Signed)
 Addended by: SHERRE SUZEN PARAS on: 02/13/2024 04:00 PM   Modules accepted: Orders

## 2024-02-13 NOTE — Progress Notes (Signed)
 Subjective: CC:DM PCP: Jolinda Norene HERO, DO YEP:Fjmb D Clute is a 78 y.o. female presenting to clinic today for:  Type 2 Diabetes with hypertension, hyperlipidemia:  Reports compliance with medications.  Has had no hypoglycemic episodes.  Reports on average blood sugars are 110-170s.  Ran out of Ozempic  about a month ago due to issues with insurance and then later her pharmacy being out of stock of the medication.  She reports no chest pain, shortness of breath, falls.  She is utilizing her lymphedema pumps.  She continues to follow-up with her orthopedist for knee injections.  She continues to have a lot of issues with her left knee requiring a cane.  She admits that she does not exercise regularly due to balance and pain issues.    Diabetes Health Maintenance Due  Topic Date Due   FOOT EXAM  08/04/2023   OPHTHALMOLOGY EXAM  11/25/2023   HEMOGLOBIN A1C  01/04/2024    ROS: Per HPI  Allergies  Allergen Reactions   Metformin  And Related Diarrhea   Past Medical History:  Diagnosis Date   Arthritis    Functional heart murmur 2019   Hypertension 1980   Leg ulcer (HCC)    Morbid obesity (HCC)    Varicose veins     Current Outpatient Medications:    acetaminophen (TYLENOL) 650 MG CR tablet, Take 650 mg by mouth every 8 (eight) hours as needed for pain., Disp: , Rfl:    Alcohol Swabs (DROPSAFE ALCOHOL PREP) 70 % PADS, USE TO CHECK BLOOD GLUCOSE DAILY, Disp: 100 each, Rfl: 3   apixaban  (ELIQUIS ) 2.5 MG TABS tablet, TAKE 1 TABLET(2.5 MG) BY MOUTH TWICE DAILY, Disp: 60 tablet, Rfl: 3   Ascorbic Acid 53 MG LOZG, Take by mouth., Disp: , Rfl:    atorvastatin  (LIPITOR) 40 MG tablet, Take 1 tablet (40 mg total) by mouth daily., Disp: 90 tablet, Rfl: 3   Blood Glucose Monitoring Suppl DEVI, Check BGs daily as directed. May substitute to any manufacturer covered by patient's insurance. E11.9, Disp: 1 each, Rfl: 0   cyclobenzaprine  (FLEXERIL ) 5 MG tablet, Take 1 tablet (5 mg total) by  mouth 3 (three) times daily as needed for muscle spasms., Disp: 30 tablet, Rfl: 1   diclofenac  Sodium (VOLTAREN ) 1 % GEL, Apply 4 g topically 4 (four) times daily. As needed for joint pain, Disp: 400 g, Rfl: 3   diltiazem  (CARDIZEM  CD) 240 MG 24 hr capsule, Take 1 capsule (240 mg total) by mouth daily., Disp: 90 capsule, Rfl: 3   famotidine  (PEPCID ) 40 MG tablet, TAKE 1 TABLET TWICE DAILY, Disp: 180 tablet, Rfl: 1   furosemide  (LASIX ) 40 MG tablet, TAKE 1 TABLET EVERY DAY, Disp: 90 tablet, Rfl: 3   Glucose Blood (BLOOD GLUCOSE TEST STRIPS) STRP, TEST BLOOD SUGAR EVERY DAY, Disp: 100 strip, Rfl: 3   HYDROcodone-acetaminophen (NORCO) 10-325 MG tablet, Take 1 tablet by mouth 3 (three) times daily as needed., Disp: , Rfl:    Lancet Device MISC, Check BGs daily as directed.E11.9. May substitute to any manufacturer covered by patient's insurance., Disp: 1 each, Rfl: 0   Lancets Misc. MISC, Check BGs once daily as directed. E11.9 May substitute to any manufacturer covered by patient's insurance., Disp: 100 each, Rfl: 3   lansoprazole  (PREVACID ) 30 MG capsule, TAKE 1 CAPSULE EVERY DAY AT 12 NOON, Disp: 90 capsule, Rfl: 3   mirtazapine  (REMERON ) 7.5 MG tablet, Take 1 tablet (7.5 mg total) by mouth at bedtime., Disp: 90 tablet, Rfl: 3  Semaglutide ,0.25 or 0.5MG /DOS, (OZEMPIC , 0.25 OR 0.5 MG/DOSE,) 2 MG/3ML SOPN, Inject 0.5 mg into the skin once a week., Disp: 3 mL, Rfl: 0   TRUEplus Lancets 33G MISC, CHECK BLOOD GLUCOSE DAILY, Disp: 100 each, Rfl: 3 Social History   Socioeconomic History   Marital status: Widowed    Spouse name: Not on file   Number of children: 3   Years of education: 12   Highest education level: High school graduate  Occupational History   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  Tobacco Use   Smoking status: Former    Current packs/day: 0.00    Types: Cigarettes    Quit date: 05/11/1975    Years since quitting: 48.7   Smokeless tobacco: Never  Vaping Use   Vaping  status: Never Used  Substance and Sexual Activity   Alcohol use: Yes    Alcohol/week: 0.0 standard drinks of alcohol    Comment: 1 glass of wine every 2-3 weeks   Drug use: No   Sexual activity: Not Currently    Birth control/protection: Surgical  Other Topics Concern   Not on file  Social History Narrative   Not on file   Social Drivers of Health   Financial Resource Strain: Low Risk  (10/13/2022)   Overall Financial Resource Strain (CARDIA)    Difficulty of Paying Living Expenses: Not hard at all  Food Insecurity: No Food Insecurity (10/13/2022)   Hunger Vital Sign    Worried About Running Out of Food in the Last Year: Never true    Ran Out of Food in the Last Year: Never true  Transportation Needs: No Transportation Needs (04/22/2023)   Received from [facility]   OASIS A1250: Transportation    Lack of Transportation (Medical): No    Lack of Transportation (Non-Medical): No    Patient Unable or Declines to Respond: No  Physical Activity: Inactive (10/13/2022)   Exercise Vital Sign    Days of Exercise per Week: 0 days    Minutes of Exercise per Session: 0 min  Stress: No Stress Concern Present (10/13/2022)   [employer] of Occupational Health - Occupational Stress Questionnaire    Feeling of Stress : Not at all  Social Connections: Moderately Isolated (10/13/2022)   Social Connection and Isolation Panel    Frequency of Communication with Friends and Family: More than three times a week    Frequency of Social Gatherings with Friends and Family: More than three times a week    Attends Religious Services: More than 4 times per year    Active Member of [employer] or Organizations: No    Attends Banker Meetings: Never    Marital Status: Widowed  Intimate Partner Violence: Not At Risk (10/13/2022)   Humiliation, Afraid, Rape, and Kick questionnaire    Fear of Current or Ex-Partner: No    Emotionally Abused: No    Physically Abused: No    Sexually Abused: No    Family History  Problem Relation Age of Onset   Hypertension Mother    Stroke Mother        after 42   Hyperlipidemia Mother    Thyroid  disease Mother    Hypertension Father    Cirrhosis Father    Alcohol abuse Father    Heart attack Sister 17   Hypertension Brother    Ovarian cancer Neg Hx    Breast cancer Neg Hx    Prostate cancer Neg Hx    Colon cancer Neg Hx  Objective: Office vital signs reviewed. BP 134/72   Pulse 68   Temp 97.9 F (36.6 C)   Ht 5' 8 (1.727 m)   Wt 262 lb 8 oz (119.1 kg)   SpO2 98%   BMI 39.91 kg/m   Physical Examination:  General: Awake, alert, morbidly obese, No acute distress HEENT: Sclera white.  Moist mucous membranes.  PERRLA.  EOMI Cardio: regular rate and rhythm, S1S2 heard, + murmurs appreciated Pulm: clear to auscultation bilaterally, no wheezes, rhonchi or rales; normal work of breathing on room air Neuro: No focal neurologic deficits.  Alert and oriented. MSK: Ambulates with use of cane.  Gait is antalgic   Lab Results  Component Value Date   HGBA1C 5.5 07/07/2023    Assessment/ Plan: 78 y.o. female   Diabetes mellitus treated with injections of non-insulin medication (HCC) - Plan: Bayer DCA Hb A1c Waived, CMP14+EGFR, Microalbumin / creatinine urine ratio  Hyperlipidemia associated with type 2 diabetes mellitus (HCC) - Plan: CMP14+EGFR, Lipid Panel  Hypertension associated with diabetes (HCC) - Plan: CMP14+EGFR  Leg cramps - Plan: cyclobenzaprine  (FLEXERIL ) 5 MG tablet  Fall, initial encounter  Left against medical advice  Sugar well-controlled despite lapse in Ozempic .  A sample was provided to her today.  Urine microalbumin, CMP collected.  Lipid panel.  Continue all medications as prescribed.  Blood pressure well-controlled  On her way out of the office today she sustained a mechanical fall due to her foot where.  She did hit her head.  There were no focal neurologic deficits and she adamantly refused to get a  CAT scan nor seek ER evaluation today.  We discussed red flag signs and symptoms warranting evaluation in the ER and she voiced good understanding and left with a family member  PNA shot given   Welby Montminy M Srija Southard, DO [facility] [phone]

## 2024-02-13 NOTE — Addendum Note (Signed)
 Addended by: Grisel Blumenstock D on: 02/13/2024 08:29 AM   Modules accepted: Orders

## 2024-02-14 LAB — CMP14+EGFR
ALT: 11 IU/L (ref 0–32)
AST: 15 IU/L (ref 0–40)
Albumin: 4 g/dL (ref 3.8–4.8)
Alkaline Phosphatase: 176 IU/L — ABNORMAL HIGH (ref 49–135)
BUN/Creatinine Ratio: 17 (ref 12–28)
BUN: 14 mg/dL (ref 8–27)
Bilirubin Total: 0.6 mg/dL (ref 0.0–1.2)
CO2: 24 mmol/L (ref 20–29)
Calcium: 9 mg/dL (ref 8.7–10.3)
Chloride: 105 mmol/L (ref 96–106)
Creatinine, Ser: 0.82 mg/dL (ref 0.57–1.00)
Globulin, Total: 2.5 g/dL (ref 1.5–4.5)
Glucose: 101 mg/dL — ABNORMAL HIGH (ref 70–99)
Potassium: 4.4 mmol/L (ref 3.5–5.2)
Sodium: 142 mmol/L (ref 134–144)
Total Protein: 6.5 g/dL (ref 6.0–8.5)
eGFR: 74 mL/min/1.73 (ref >59)

## 2024-02-14 LAB — LIPID PANEL
Chol/HDL Ratio: 2 ratio (ref 0.0–4.4)
Cholesterol, Total: 119 mg/dL (ref 100–199)
HDL: 59 mg/dL (ref >39)
LDL Chol Calc (NIH): 46 mg/dL (ref 0–99)
Triglycerides: 64 mg/dL (ref 0–149)
VLDL Cholesterol Cal: 14 mg/dL (ref 5–40)

## 2024-02-14 LAB — MICROALBUMIN / CREATININE URINE RATIO
Creatinine, Urine: 160.2 mg/dL
Microalb/Creat Ratio: 2 mg/g{creat} (ref 0–29)
Microalbumin, Urine: 3.4 ug/mL

## 2024-02-14 NOTE — Telephone Encounter (Signed)
CALLED PATIENT, NO ANSWER, LEFT MESSAGE TO RETURN CALL 

## 2024-02-16 ENCOUNTER — Other Ambulatory Visit (HOSPITAL_COMMUNITY): Payer: Self-pay

## 2024-02-21 ENCOUNTER — Telehealth: Payer: Self-pay

## 2024-02-21 ENCOUNTER — Other Ambulatory Visit (HOSPITAL_COMMUNITY): Payer: Self-pay

## 2024-02-21 NOTE — Telephone Encounter (Unsigned)
 Copied from CRM [id number]. Topic: Clinical - Medication Prior Auth >> Feb 21, 2024 12:52 PM Montie POUR wrote: Reason for CRM:  Pharmacy states that insurance needs a prior authorization for Semaglutide ,0.25 or 0.5MG /DOS, (OZEMPIC , 0.25 OR 0.5 MG/DOSE,) 2 MG/3ML SOPN. Please let Exilda know when prior authorization. Her number is [phone].

## 2024-02-21 NOTE — Telephone Encounter (Signed)
 CALLED PATIENT, NO ANSWER, LEFT MESSAGE TO RETURN CALL  MAILED LETTER

## 2024-02-22 ENCOUNTER — Other Ambulatory Visit (HOSPITAL_COMMUNITY): Payer: Self-pay

## 2024-02-22 NOTE — Telephone Encounter (Signed)
 PA DENIED

## 2024-02-22 NOTE — Telephone Encounter (Signed)
 Patient is receiving Ozempic  through patient assistance. Shipment arrived at office on 01/24/24. Please see telephone encounter on 01/24/24.

## 2024-03-01 ENCOUNTER — Ambulatory Visit

## 2024-03-08 ENCOUNTER — Other Ambulatory Visit (HOSPITAL_COMMUNITY): Payer: Self-pay

## 2024-03-26 ENCOUNTER — Ambulatory Visit

## 2024-03-28 ENCOUNTER — Ambulatory Visit: Admitting: Internal Medicine

## 2024-03-28 ENCOUNTER — Ambulatory Visit

## 2024-03-28 NOTE — Progress Notes (Deleted)
 Kim Wilkerson, female    DOB: [birth year],   MRN: [id number]   Brief patient profile:  70   yobf quit smoking 1977 with wt baseline 180 with progressive wt gain referred to pulmonary clinic in [place]  06/29/2021 by DR [facility] for doe.  Last time wt was down 238 2022 slowed by knees and progressive wt gain since then    History of Present Illness  06/29/2021  Pulmonary/ 1st office eval/ Kim Wilkerson / [facility]  Chief Complaint  Patient presents with   Consult    Ref by Dr. Jeanell for DOE for the last 6 months. Worse with steps.  Dyspnea:  mailbox to house 50 ft flat / walks with cane  Cough: some choking at night and bad HB/ freq throat clearing  Sleep: cpap x 2 weeks  per [employer] per [place] Dr Steffan Saner / only doing 2 hours initially and building up to 6h  SABA use: none Rec In addition to the blood work you have planned next week you need TSH and BNP with chest xray and I will be able to see the results in our computer Prevacid  30 mg  Take  30-60 min before first meal of the day and Pepcid  (famotidine )  40 mg after supper until return to office - this is the best way to tell whether stomach acid is contributing to your problem.  GERD diet reviewed, bed blocks rec   Please schedule a follow up visit in 3 months but call sooner if needed - PFTs  > not done as of 09/25/2021    09/25/2021  f/u ov/[place] office/Wilberto Console re: doe maint on gerd  rx though confused with names of meds  Chief Complaint  Patient presents with   Follow-up    Breathing doing better  Echo done on 09/16/21   Dyspnea:  limited by knees / still does krogers leaning on basket  stops every aisle  Cough: none  Sleeping: bed is flat / 3-4 pillows  SABA use: none  02: none  Rec Pantoprazole  (protonix ) 40 mg   Take  30-60 min before first meal of the day and Pepcid  (famotidine )  40 mg after supper     We will walk you again today for a baseline in case your breathing ever gets worse  Best bet is to  let your PCP work on your blood pressure and wt loss with pulmonary follow up as needed  We will need to set up a PFT in [place] or [place] next available and call you with the results and get you back in if we see a lung problem, but I don't think you have one at this point.  Rec Pantoprazole  (protonix ) 40 mg   Take  30-60 min before first meal of the day and Pepcid  (famotidine )  40 mg after supper     Best bet is to let your PCP work on your blood pressure and wt loss with pulmonary follow up as needed  We will need to set up a PFT in [place] or [place] next available and call you with the results and get you back in if we see a lung problem, but I don't think you have one at this point.    03/28/2024  f/u ov/ office/Kim Wilkerson re: DOE  maint on ***  No chief complaint on file.   Dyspnea:  *** Cough: *** Sleeping: ***   resp cc  SABA use: *** 02: ***  Lung cancer screening: ***   No obvious  day to day or daytime variability or assoc excess/ purulent sputum or mucus plugs or hemoptysis or cp or chest tightness, subjective wheeze or overt sinus or hb symptoms.    Also denies any obvious fluctuation of symptoms with weather or environmental changes or other aggravating or alleviating factors except as outlined above   No unusual exposure hx or h/o childhood pna/ asthma or knowledge of premature birth.  Current Allergies, Complete Past Medical History, Past Surgical History, Family History, and Social History were reviewed in [employer] record.  ROS  The following are not active complaints unless bolded Hoarseness, sore throat, dysphagia, dental problems, itching, sneezing,  nasal congestion or discharge of excess mucus or purulent secretions, ear ache,   fever, chills, sweats, unintended wt loss or wt gain, classically pleuritic or exertional cp,  orthopnea pnd or arm/hand swelling  or leg swelling, presyncope, palpitations, abdominal pain,  anorexia, nausea, vomiting, diarrhea  or change in bowel habits or change in bladder habits, change in stools or change in urine, dysuria, hematuria,  rash, arthralgias, visual complaints, headache, numbness, weakness or ataxia or problems with walking or coordination,  change in mood or  memory.        No outpatient medications have been marked as taking for the 03/28/24 encounter (Appointment) with Kim Ozell NOVAK, MD.              Past Medical History:  Diagnosis Date   Arthritis    Functional heart murmur 2019   Hypertension 1980   Leg ulcer (HCC)    Morbid obesity (HCC)    Varicose veins        Objective:    wts   03/28/2024      ***   09/25/21 290 lb 9.6 oz (131.8 kg)  09/23/21 289 lb 12.8 oz (131.5 kg)  06/29/21 297 lb (134.7 kg)    Vital signs reviewed  03/28/2024  - Note at rest 02 sats  ***% on ***   General appearance:    ***     trace pitting edema both LE ? Lymphedema also ?              Assessment

## 2024-04-16 ENCOUNTER — Ambulatory Visit: Attending: Internal Medicine

## 2024-04-16 DIAGNOSIS — I272 Pulmonary hypertension, unspecified: Secondary | ICD-10-CM | POA: Diagnosis not present

## 2024-04-16 LAB — ECHOCARDIOGRAM COMPLETE
AR max vel: 1.75 cm2
AV Peak grad: 15.2 mmHg
Ao pk vel: 1.95 m/s
Area-P 1/2: 4.86 cm2
Calc EF: 50.6 %
MV M vel: 5.78 m/s
MV Peak grad: 133.6 mmHg
S' Lateral: 3.9 cm
Single Plane A2C EF: 48 %
Single Plane A4C EF: 53.3 %

## 2024-04-19 ENCOUNTER — Ambulatory Visit: Payer: Self-pay | Admitting: Internal Medicine

## 2024-04-19 ENCOUNTER — Telehealth: Payer: Self-pay

## 2024-04-19 ENCOUNTER — Other Ambulatory Visit: Payer: Self-pay | Admitting: Family Medicine

## 2024-04-19 DIAGNOSIS — I10 Essential (primary) hypertension: Secondary | ICD-10-CM

## 2024-04-19 NOTE — Telephone Encounter (Signed)
 Informed patient her Novo Nordisk shipment is ready for pickup.   4 boxes Ozempic  0.25/0.5mg   Informed patient of 2026 Novo Nordisk change and placed paperwork in bag with LIS and Medicare Payment Plan info.

## 2024-04-20 ENCOUNTER — Other Ambulatory Visit: Payer: Self-pay | Admitting: Family Medicine

## 2024-04-20 DIAGNOSIS — E118 Type 2 diabetes mellitus with unspecified complications: Secondary | ICD-10-CM

## 2024-06-26 ENCOUNTER — Ambulatory Visit
